# Patient Record
Sex: Female | Born: 1970
Health system: Southern US, Community
[De-identification: ages and names within clinical notes are randomized; demographics above are authoritative.]

## PROBLEM LIST (undated history)

## (undated) ENCOUNTER — Emergency Department (HOSPITAL_BASED_OUTPATIENT_CLINIC_OR_DEPARTMENT_OTHER): Admission: EM | Payer: PPO

## (undated) DIAGNOSIS — M5136 Other intervertebral disc degeneration, lumbar region: Secondary | ICD-10-CM

## (undated) DIAGNOSIS — M51369 Other intervertebral disc degeneration, lumbar region without mention of lumbar back pain or lower extremity pain: Secondary | ICD-10-CM

## (undated) DIAGNOSIS — M5126 Other intervertebral disc displacement, lumbar region: Secondary | ICD-10-CM

## (undated) DIAGNOSIS — N921 Excessive and frequent menstruation with irregular cycle: Secondary | ICD-10-CM

## (undated) DIAGNOSIS — K909 Intestinal malabsorption, unspecified: Secondary | ICD-10-CM

## (undated) DIAGNOSIS — M6530 Trigger finger, unspecified finger: Secondary | ICD-10-CM

## (undated) DIAGNOSIS — J45909 Unspecified asthma, uncomplicated: Secondary | ICD-10-CM

## (undated) HISTORY — DX: Other intervertebral disc degeneration, lumbar region: M51.36

## (undated) HISTORY — DX: Excessive and frequent menstruation with irregular cycle: N92.1

## (undated) HISTORY — PX: MYOMECTOMY ABDOMINAL APPROACH: SUR870

## (undated) HISTORY — DX: Other intervertebral disc degeneration, lumbar region without mention of lumbar back pain or lower extremity pain: M51.369

## (undated) HISTORY — DX: Unspecified asthma, uncomplicated: J45.909

## (undated) HISTORY — DX: Intestinal malabsorption, unspecified: K90.9

## (undated) HISTORY — DX: Other intervertebral disc displacement, lumbar region: M51.26

---
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
## 1998-08-24 ENCOUNTER — Ambulatory Visit (HOSPITAL_COMMUNITY): Admission: RE | Admit: 1998-08-24 | Discharge: 1998-08-24 | Payer: Self-pay | Admitting: Family Medicine

## 1998-08-24 ENCOUNTER — Encounter: Payer: Self-pay | Admitting: Family Medicine

## 1998-09-14 ENCOUNTER — Ambulatory Visit (HOSPITAL_COMMUNITY): Admission: RE | Admit: 1998-09-14 | Discharge: 1998-09-14 | Payer: Self-pay | Admitting: Family Medicine

## 1998-11-28 ENCOUNTER — Inpatient Hospital Stay (HOSPITAL_COMMUNITY): Admission: AD | Admit: 1998-11-28 | Discharge: 1998-11-28 | Payer: Self-pay | Admitting: Obstetrics and Gynecology

## 1998-12-22 ENCOUNTER — Encounter: Payer: Self-pay | Admitting: Obstetrics and Gynecology

## 1998-12-22 ENCOUNTER — Ambulatory Visit (HOSPITAL_COMMUNITY): Admission: RE | Admit: 1998-12-22 | Discharge: 1998-12-22 | Payer: Self-pay | Admitting: Obstetrics and Gynecology

## 1999-05-12 ENCOUNTER — Encounter (HOSPITAL_COMMUNITY): Admission: RE | Admit: 1999-05-12 | Discharge: 1999-08-10 | Payer: Self-pay | Admitting: Obstetrics and Gynecology

## 1999-12-13 ENCOUNTER — Encounter: Admission: RE | Admit: 1999-12-13 | Discharge: 1999-12-13 | Payer: Self-pay | Admitting: Otolaryngology

## 1999-12-13 ENCOUNTER — Encounter: Payer: Self-pay | Admitting: Otolaryngology

## 2005-03-26 ENCOUNTER — Emergency Department: Payer: Self-pay | Admitting: Emergency Medicine

## 2005-03-28 ENCOUNTER — Ambulatory Visit: Payer: Self-pay | Admitting: Emergency Medicine

## 2005-04-26 ENCOUNTER — Encounter: Admission: RE | Admit: 2005-04-26 | Discharge: 2005-04-26 | Payer: Self-pay | Admitting: Unknown Physician Specialty

## 2005-04-29 ENCOUNTER — Encounter: Admission: RE | Admit: 2005-04-29 | Discharge: 2005-04-29 | Payer: Self-pay | Admitting: Diagnostic Radiology

## 2005-05-26 ENCOUNTER — Ambulatory Visit (HOSPITAL_COMMUNITY): Admission: RE | Admit: 2005-05-26 | Discharge: 2005-05-26 | Payer: Self-pay | Admitting: Diagnostic Radiology

## 2005-05-30 ENCOUNTER — Observation Stay (HOSPITAL_COMMUNITY): Admission: RE | Admit: 2005-05-30 | Discharge: 2005-05-31 | Payer: Self-pay | Admitting: Diagnostic Radiology

## 2005-07-31 ENCOUNTER — Encounter: Admission: RE | Admit: 2005-07-31 | Discharge: 2005-07-31 | Payer: Self-pay | Admitting: Diagnostic Radiology

## 2005-08-04 ENCOUNTER — Ambulatory Visit: Payer: Self-pay | Admitting: Unknown Physician Specialty

## 2005-09-13 ENCOUNTER — Ambulatory Visit: Payer: Self-pay | Admitting: Internal Medicine

## 2005-10-07 ENCOUNTER — Ambulatory Visit: Payer: Self-pay | Admitting: Unknown Physician Specialty

## 2006-01-04 ENCOUNTER — Encounter: Admission: RE | Admit: 2006-01-04 | Discharge: 2006-01-04 | Payer: Self-pay | Admitting: Diagnostic Radiology

## 2006-09-19 ENCOUNTER — Ambulatory Visit: Payer: Self-pay | Admitting: Gastroenterology

## 2007-10-25 ENCOUNTER — Ambulatory Visit: Payer: Self-pay | Admitting: Specialist

## 2008-01-30 ENCOUNTER — Ambulatory Visit (HOSPITAL_COMMUNITY): Admission: RE | Admit: 2008-01-30 | Discharge: 2008-01-31 | Payer: Self-pay | Admitting: Neurological Surgery

## 2008-03-13 ENCOUNTER — Ambulatory Visit: Payer: Self-pay | Admitting: Neurological Surgery

## 2008-05-13 ENCOUNTER — Encounter: Admission: RE | Admit: 2008-05-13 | Discharge: 2008-05-13 | Payer: Self-pay | Admitting: Neurological Surgery

## 2008-06-13 ENCOUNTER — Encounter: Admission: RE | Admit: 2008-06-13 | Discharge: 2008-06-13 | Payer: Self-pay | Admitting: Neurological Surgery

## 2009-08-26 ENCOUNTER — Ambulatory Visit: Payer: Self-pay | Admitting: Family Medicine

## 2009-09-02 IMAGING — CR DG CHEST 2V
2 series · 2 of 2 positions shown · non-contrast
Comparison: None

CLINICAL DATA: Asthma and short of breath.  Preop respiratory
exam.

CHEST - 2 VIEW

[view not recorded (1 of 2)]
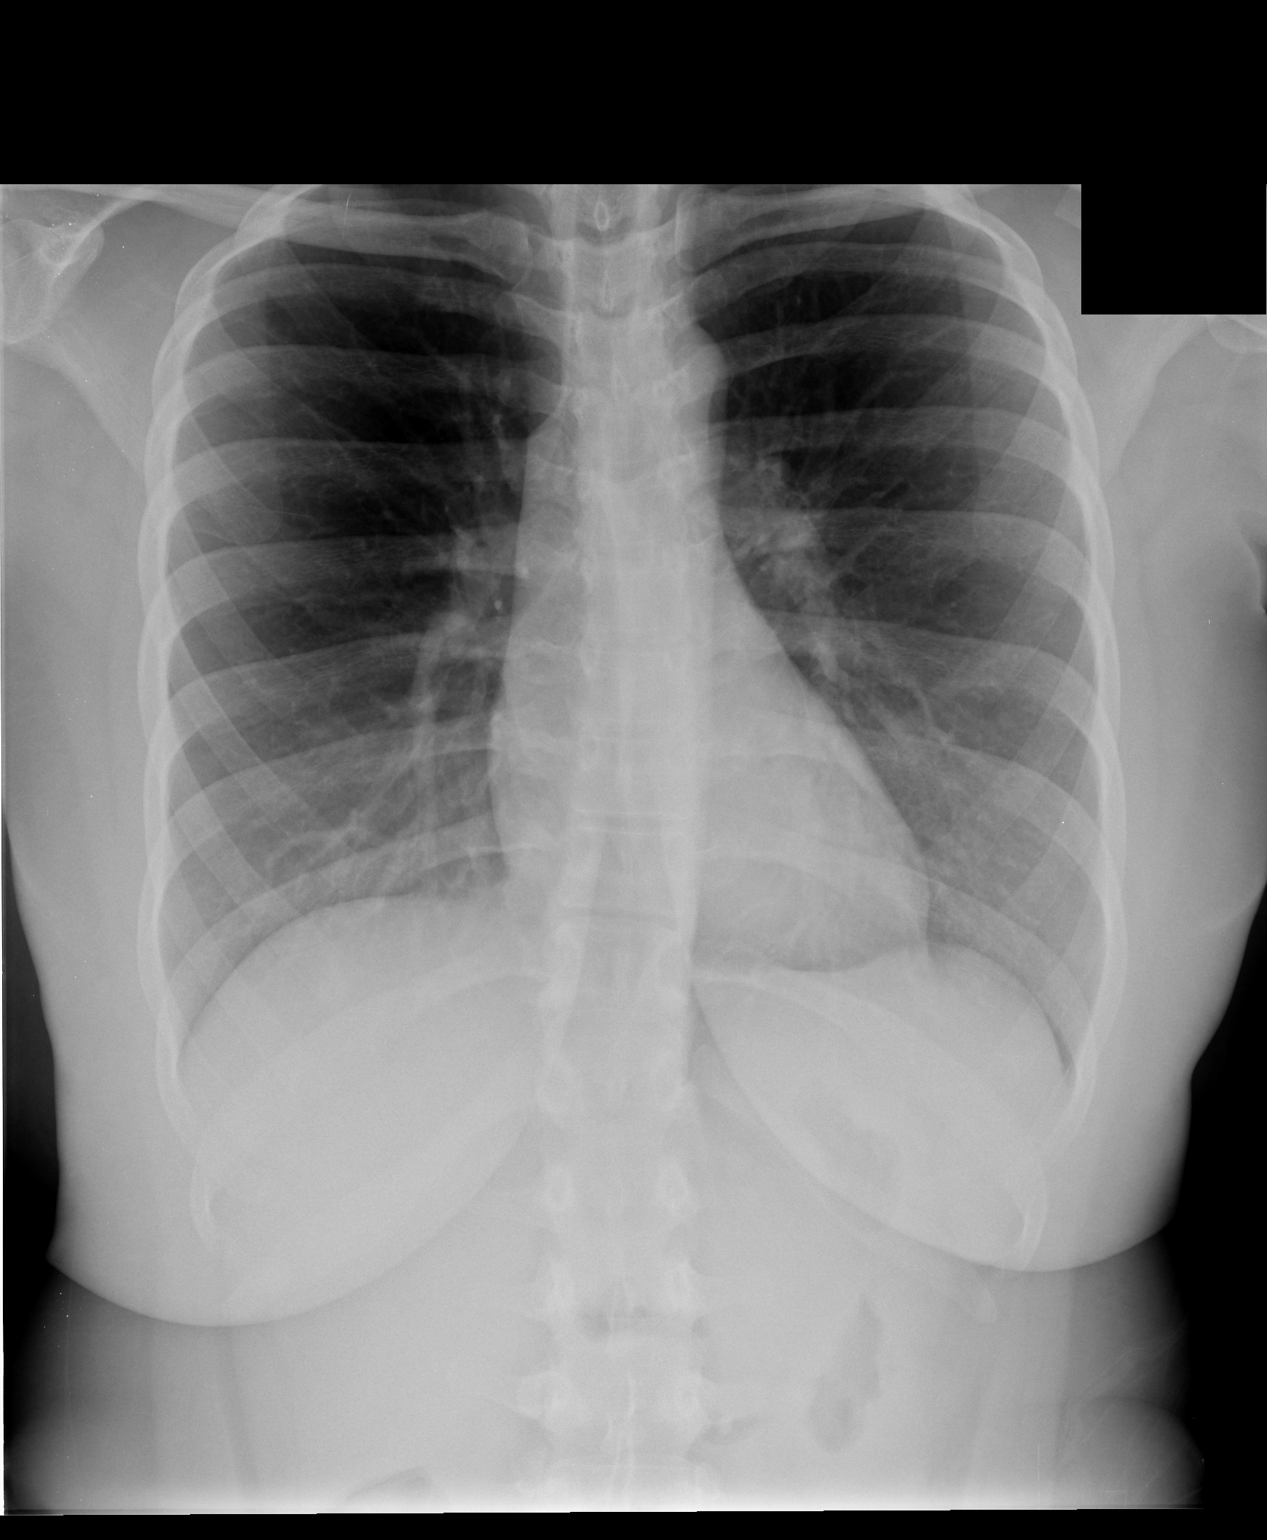

[view not recorded (2 of 2)]
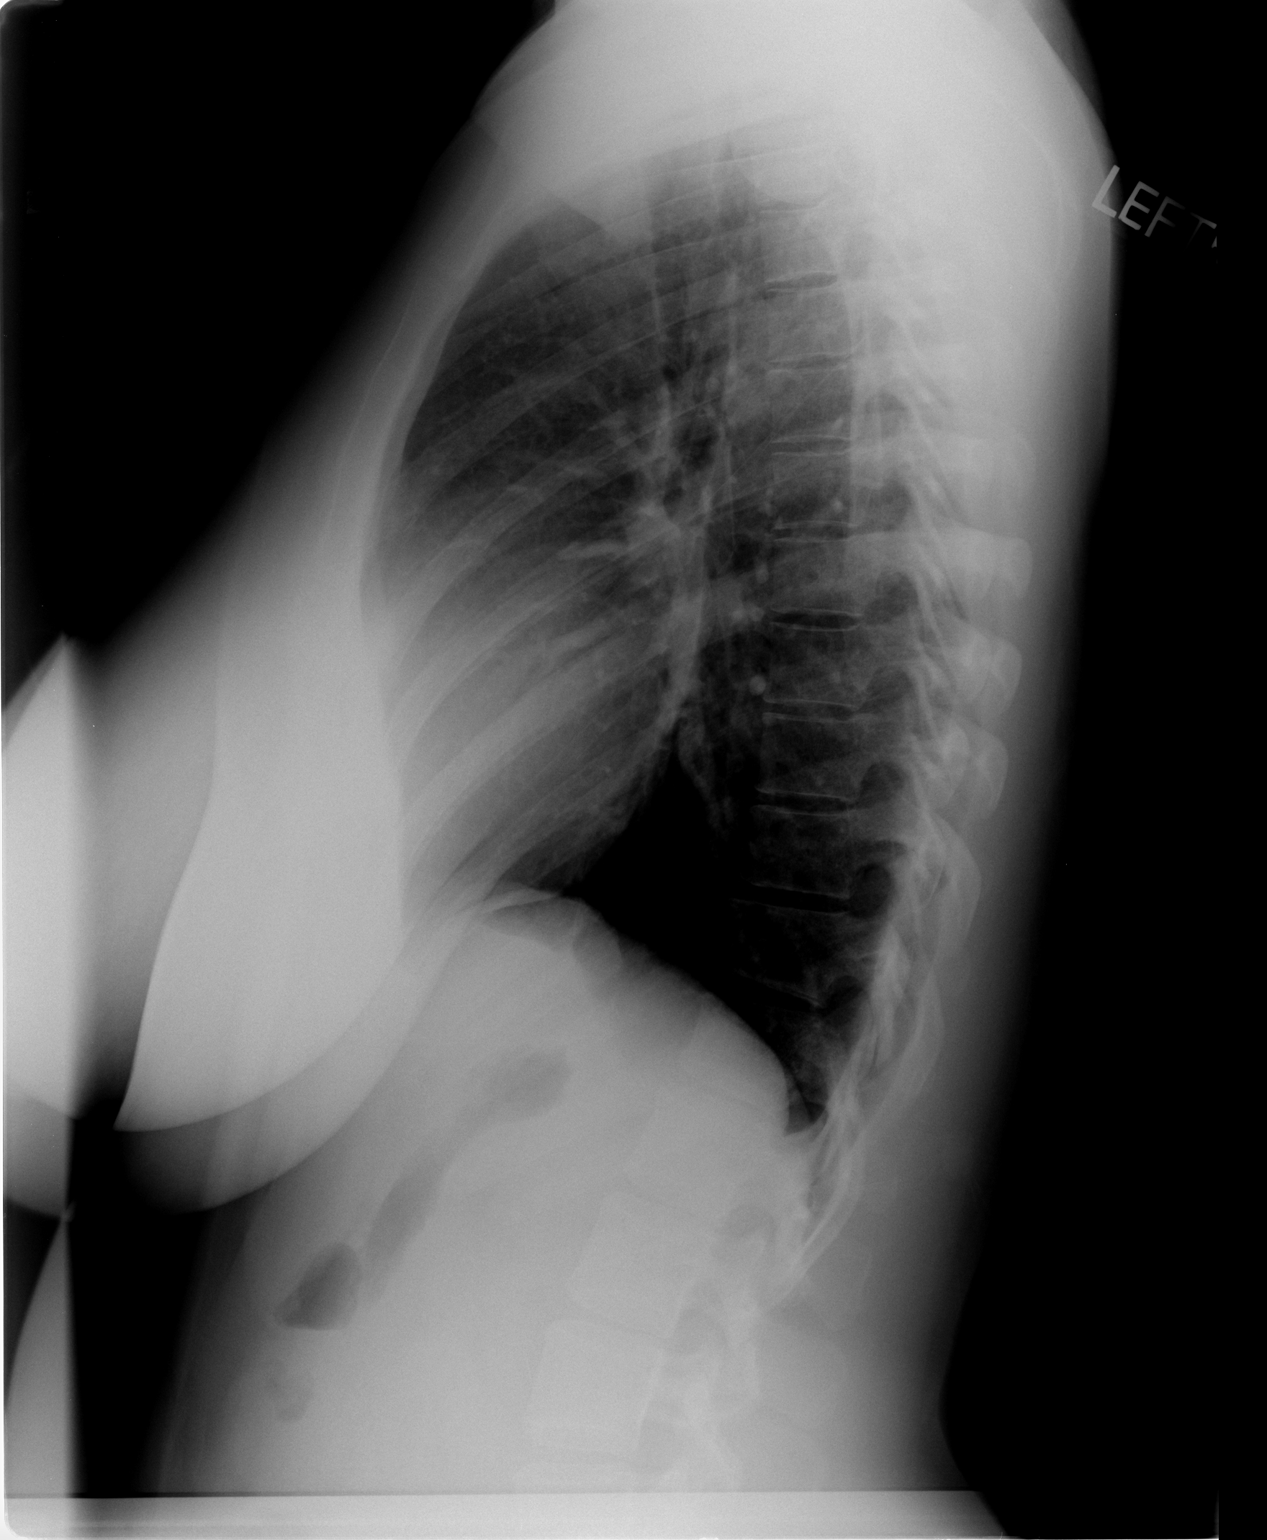

[2 of 2 positions shown; findings below may reference images not displayed]

FINDINGS: Heart size and mediastinal contours are normal.  Both
lungs are clear.  There is no evidence of pleural effusion.  No
mass or adenopathy identified.
IMPRESSION: No active disease.

## 2009-10-15 ENCOUNTER — Ambulatory Visit: Payer: Self-pay | Admitting: Otolaryngology

## 2010-01-28 ENCOUNTER — Ambulatory Visit: Payer: Self-pay | Admitting: Otolaryngology

## 2010-06-12 ENCOUNTER — Encounter: Payer: Self-pay | Admitting: Unknown Physician Specialty

## 2010-06-14 ENCOUNTER — Encounter: Payer: Self-pay | Admitting: Neurological Surgery

## 2010-10-05 NOTE — Op Note (Signed)
NAME:  Angela Jimenez, COZZA NO.:  [id number]   MEDICAL RECORD NO.:  [id number]          PATIENT TYPE:  OIB   LOCATION:  3523                         FACILITY:  [facility]   PHYSICIAN:  Tia Alert, MD     DATE OF BIRTH:  [birth year]   DATE OF PROCEDURE:  01/30/2008  DATE OF DISCHARGE:                               OPERATIVE REPORT   PREOPERATIVE DIAGNOSIS:  Cervical spondylosis with cervical disk  herniation at C4-C5 with neck and left arm pain.   POSTOPERATIVE DIAGNOSIS:  Cervical spondylosis with cervical disk  herniation at C4-C5 with neck and left arm pain.   PROCEDURE:  1. Decompressive anterior cervical diskectomy at C4-C5.  2. Anterior cervical arthrodesis at C4-C5 utilizing a 6-mm      corticocancellous allograft.  3. Anterior cervical plating at C4-C5 utilizing a 22-mm NuVasive Helix      plate.   SURGEON:  Tia Alert, MD   ASSISTANT:  Donalee Citrin, MD   ANESTHESIA:  General endotracheal.   COMPLICATIONS:  None apparent.   INDICATIONS FOR PROCEDURE:  Angela Jimenez is a 40 year old female who  presented with neck and left arm pain.  She had an MRI which showed  cervical spondylosis with cervical disk herniation at C4-C5.  She had  tried medical management for quite some time without significant relief  including epidural injections, recommended anterior cervical diskectomy,  fusion, and plating at C4-C5.  She understood the risks, benefits, and  expected outcome and wished to proceed.   DESCRIPTION OF PROCEDURE:  The patient was taken to the operating room  and after induction of adequate generalized endotracheal anesthesia, she  was placed in supine position on the operating room table.  Her right  anterior cervical region was prepped with DuraPrep and then draped in  the usual sterile fashion.  A 5 mL of local anesthesia was injected and  a transverse incision was made to the right of midline and carried down  to the platysma which was elevated,  opened, and undermined with  Metzenbaum scissors.  I then dissected the plane medial to the  sternocleidomastoid muscle, internal carotid artery, and lateral to the  trachea and esophagus to expose C4-C5 and intraoperative fluoroscopy  confirmed my level and then we took down the longus colli muscles and  placed the shadow line retractors under this.  We incised the disk space  and performed initial diskectomy with pituitary rongeurs and curved  curettes.  We then used the high-speed drill to drill the endplates down  to the level of the posterior longitudinal ligament, which was opened  and we undercut the bodies of C4-C5 with one 2-mm Kerrison punch until  the dura was nice and relaxed all the way across.  We marched along the  C5 superior endplate to identify the pedicles bilaterally and marched  along the pedicles, identifying the C5 nerve roots and decompressed them  distally into their respective foramina.  We then palpated it with a  nerve hook circumferentially and inspected with both palpation and  visualization to assure adequate decompression of the central canal and  the C5 nerve roots.  We then irrigated with saline solution, dried the  surgical bed, measured the interspace to be 6 mm and used a 6-mm  corticocancellous allograft and tapped this into position at C4-C5.  We  then used a 22-mm Helix plate and placed two 13 mm variable angle screws  in the bodies of C4-C5 and these locked in the plate by locking  mechanism within the plate.  We then irrigated with saline solution  containing bacitracin, dried all bleeding points and once meticulous  hemostasis was achieved closed the platysma with 3-0 Vicryl closing  subcuticular tissue with 3-0 Vicryl and closed the skin with benzoin and  Steri-Strips.  The drapes were removed.  Sterile dressing was applied.  The patient was awakened from the general anesthesia and transferred to  the recovery room in stable condition.  At the  end of the procedure, all  sponge, needle, and instrument counts were correct.      Tia Alert, MD  Electronically Signed     DSJ/MEDQ  D:  01/30/2008  T:  01/31/2008  Job:  [id number]

## 2011-02-23 LAB — CBC
HCT: 38.1
Hemoglobin: 12.7
MCHC: 33.2
MCV: 94.3
Platelets: 220
RBC: 4.04
RDW: 13.6
WBC: 4.4

## 2011-02-23 LAB — DIFFERENTIAL
Basophils Absolute: 0
Basophils Relative: 1
Eosinophils Absolute: 0.1
Eosinophils Relative: 2
Lymphocytes Relative: 41
Lymphs Abs: 1.8
Monocytes Absolute: 0.3
Monocytes Relative: 7
Neutro Abs: 2.1
Neutrophils Relative %: 49

## 2011-02-23 LAB — BASIC METABOLIC PANEL
BUN: 8
CO2: 21
Calcium: 8.7
Chloride: 110
Creatinine, Ser: 0.75
GFR calc Af Amer: >60
GFR calc non Af Amer: >60
Glucose, Bld: 90
Potassium: 3.8
Sodium: 136

## 2011-02-23 LAB — PROTIME-INR
INR: 1.1
Prothrombin Time: 14.3

## 2011-02-23 LAB — APTT: aPTT: 32

## 2011-02-23 LAB — HCG, SERUM, QUALITATIVE: Preg, Serum: NEGATIVE

## 2011-03-21 ENCOUNTER — Telehealth: Payer: Self-pay | Admitting: Emergency Medicine

## 2011-03-21 NOTE — Telephone Encounter (Signed)
PT LMOVM HERE ON 03-18-11 WANTING A F/U APPT W/ DR DOVER RE: FIBROIDS    03-21-11- S/W PT. EXPLAINED THAT SHE NEEDED TO F/U W/ HER GYN, HAVE PAP AND RECENT PELVIC -TRANSV. U/S TO SEE IF SHE HAS FIBROIDS AND IF SHE NEEDS TO BE REFERRED BACK TO [place].  ALSO, LET PT. KNOW THAT DR DOVER DOES NOT DO THESE PROCEDURES ANYMORE.

## 2012-02-22 ENCOUNTER — Emergency Department: Payer: Self-pay | Admitting: Emergency Medicine

## 2012-03-14 ENCOUNTER — Other Ambulatory Visit: Payer: Self-pay | Admitting: Neurological Surgery

## 2012-03-14 DIAGNOSIS — M545 Low back pain, unspecified: Secondary | ICD-10-CM

## 2012-03-14 DIAGNOSIS — M542 Cervicalgia: Secondary | ICD-10-CM

## 2012-03-18 ENCOUNTER — Ambulatory Visit
Admission: RE | Admit: 2012-03-18 | Discharge: 2012-03-18 | Disposition: A | Discharge status: Home / Self Care | Payer: Self-pay | Source: Ambulatory Visit | Attending: Neurological Surgery | Admitting: Neurological Surgery

## 2012-03-18 ENCOUNTER — Ambulatory Visit
Admission: RE | Admit: 2012-03-18 | Discharge: 2012-03-18 | Disposition: A | Discharge status: Home / Self Care | Payer: BC Managed Care – PPO | Source: Ambulatory Visit | Attending: Neurological Surgery | Admitting: Neurological Surgery

## 2012-03-18 DIAGNOSIS — M545 Low back pain, unspecified: Secondary | ICD-10-CM

## 2012-03-18 DIAGNOSIS — M542 Cervicalgia: Secondary | ICD-10-CM

## 2013-02-19 ENCOUNTER — Ambulatory Visit: Payer: Self-pay | Admitting: Internal Medicine

## 2013-03-20 ENCOUNTER — Ambulatory Visit: Payer: Self-pay | Admitting: Obstetrics & Gynecology

## 2013-03-20 LAB — PREGNANCY, URINE: Pregnancy Test, Urine: NEGATIVE m[IU]/mL

## 2013-03-20 LAB — CBC
HCT: 35.5 % (ref 35.0–47.0)
HGB: 11.8 g/dL — ABNORMAL LOW (ref 12.0–16.0)
MCH: 29.2 pg (ref 26.0–34.0)
MCHC: 33.3 g/dL (ref 32.0–36.0)
MCV: 88 fL (ref 80–100)
Platelet: 238 10*3/uL (ref 150–440)
RBC: 4.05 10*6/uL (ref 3.80–5.20)
RDW: 17.1 % — ABNORMAL HIGH (ref 11.5–14.5)
WBC: 4.9 10*3/uL (ref 3.6–11.0)

## 2013-03-26 ENCOUNTER — Ambulatory Visit: Payer: Self-pay | Admitting: Obstetrics & Gynecology

## 2013-03-27 LAB — PATHOLOGY REPORT

## 2013-05-21 ENCOUNTER — Encounter: Payer: Self-pay | Admitting: Podiatry

## 2013-05-21 ENCOUNTER — Ambulatory Visit (INDEPENDENT_AMBULATORY_CARE_PROVIDER_SITE_OTHER): Payer: Medicaid Other

## 2013-05-21 ENCOUNTER — Ambulatory Visit (INDEPENDENT_AMBULATORY_CARE_PROVIDER_SITE_OTHER): Payer: Medicaid Other | Admitting: Podiatry

## 2013-05-21 VITALS — BP 124/78 | HR 81 | Resp 16 | Ht 63.0 in | Wt 170.0 lb

## 2013-05-21 DIAGNOSIS — M7750 Other enthesopathy of unspecified foot: Secondary | ICD-10-CM

## 2013-05-21 DIAGNOSIS — M069 Rheumatoid arthritis, unspecified: Secondary | ICD-10-CM

## 2013-05-21 DIAGNOSIS — M25579 Pain in unspecified ankle and joints of unspecified foot: Secondary | ICD-10-CM

## 2013-05-21 DIAGNOSIS — M25571 Pain in right ankle and joints of right foot: Secondary | ICD-10-CM

## 2013-05-21 NOTE — Progress Notes (Signed)
   Subjective:    Patient ID: Angela Jimenez, female    DOB: November 18, 1970, 42 y.o.   MRN: [id number]  HPI Comments: RIGHT ANKLE PAIN  N SHARP  L RIGHT MEDIAL ANKLE  D YESTERDAY  O ALL OF SUDDEN  C LITTLE BETTER  A COULD NOT WALK ON IT  T ICE   Foot Pain Associated symptoms include headaches.      Review of Systems  HENT:       SINUS PROBLEMS   Respiratory: Positive for chest tightness and shortness of breath.        DIFFICULTY BREATHING   Musculoskeletal: Positive for back pain.  Allergic/Immunologic: Positive for environmental allergies and food allergies.  Neurological: Positive for dizziness, light-headedness and headaches.  All other systems reviewed and are negative.       Objective:   Physical Exam        Assessment & Plan:

## 2013-05-22 LAB — URIC ACID: Uric Acid: 3.7 mg/dL (ref 2.5–7.1)

## 2013-05-22 LAB — RHEUMATOID FACTOR: Rhuematoid fact SerPl-aCnc: 8 IU/mL (ref 0.0–13.9)

## 2013-05-22 LAB — ANA: Anti Nuclear Antibody(ANA): NEGATIVE

## 2013-05-22 LAB — SEDIMENTATION RATE: Sed Rate: 2 mm/hr (ref 0–32)

## 2013-05-22 LAB — C-REACTIVE PROTEIN: CRP: 3.1 mg/L (ref 0.0–4.9)

## 2013-05-22 NOTE — Progress Notes (Signed)
Subjective:     Patient ID: Angela Jimenez, female   DOB: Oct 16, 1970, 42 y.o.   MRN: [id number]  Foot Pain   patient presents stating I have pain in my right ankle of several days duration. States that she has had a history of tarsal tunnel syndrome but this does not feel that way   Review of Systems  All other systems reviewed and are negative.       Objective:   Physical Exam  Nursing note and vitals reviewed. Constitutional: She is oriented to person, place, and time.  Cardiovascular: Intact distal pulses.   Musculoskeletal: Normal range of motion.  Neurological: She is oriented to person, place, and time.  Skin: Skin is warm.   neurovascular status intact with normal muscle strength and no equinus condition noted. Range of motion of the ankle joint was found to be normal with mild edema around the posterior tibial tendon underneath the medial malleolus. No indications of rupture     Assessment:     Probable tendinitis of the posterior tibial tendon secondary to increased activity    Plan:     H&P and x-rays reviewed with patient. Instructed on reduced activity and supportive shoes ice therapy and oral anti-inflammatories. Reappoint as needed

## 2013-05-28 ENCOUNTER — Ambulatory Visit: Payer: Self-pay | Admitting: Podiatry

## 2014-09-12 NOTE — Op Note (Signed)
PATIENT NAME:  BREALYNN, CONTINO MR#:  [id number] DATE OF BIRTH:  10-Jul-1970  DATE OF PROCEDURE:  03/26/2013  PREOPERATIVE DIAGNOSIS: Fibroid uterus with abnormal bleeding.   POSTOPERATIVE DIAGNOSIS:  Fibroid uterus with abnormal bleeding.   PROCEDURE PERFORMED: Hysteroscopy, dilation and curettage procedure with myomectomy (resection of fibroid).   SURGEON:  Barnett Applebaum, M.D.   ANESTHESIA: General.   ESTIMATED BLOOD LOSS: Minimal.   COMPLICATIONS: None.   FINDINGS: Submucosal fibroids.   DISPOSITION: To recovery room stable.   TECHNIQUE: The patient is prepped and draped in the usual sterile fashion after adequate anesthesia is obtained in the dorsal lithotomy position. The bladder is drained with a Robinson catheter. A speculum is placed and the anterior lip of the cervix is grasped with a single-tooth tenaculum. The uterus is sounded to 10 cm and the cervix is dilated to a size 7 Hegar dilator. A  hysteroscope is inserted with lactated Ringer's solution used for distention of the intrauterine cavity and the above-mentioned findings visualized. Utilizing the MyoSure device, the fibroid is resected with all specimens collected and sent to pathology for further review. The entirety of the submucosal fibroid is resected down to the base of the myometrium and bilateral ostia for the fallopian tubes are visualized. There is minimal to no bleeding. There was a minimal discrepancy of fluid at 70 mL. The hysteroscope is removed and tenaculum is also removed with no bleeding noted at this site. The patient goes to the recovery room in stable condition. All sponge, instrument, and needle counts are correct.    ____________________________ R. Barnett Applebaum, MD rph:dp D: 03/26/2013 08:23:23 ET T: 03/26/2013 08:31:07 ET JOB#: [id number]  cc: Glean Salen, MD, <Dictator> Gae Dry MD ELECTRONICALLY SIGNED 03/27/2013 0:22

## 2015-02-27 ENCOUNTER — Other Ambulatory Visit: Payer: Self-pay | Admitting: Allergy

## 2015-02-27 MED ORDER — LEVOCETIRIZINE DIHYDROCHLORIDE 5 MG PO TABS: 5.0000 mg | ORAL_TABLET | Freq: Every evening | ORAL | Status: DC

## 2015-04-09 ENCOUNTER — Encounter: Payer: Self-pay | Admitting: Pediatrics

## 2015-04-09 ENCOUNTER — Ambulatory Visit (INDEPENDENT_AMBULATORY_CARE_PROVIDER_SITE_OTHER): Payer: Medicare HMO | Admitting: Pediatrics

## 2015-04-09 VITALS — BP 104/62 | HR 78 | Temp 98.0°F | Resp 16 | Ht 62.01 in | Wt 152.8 lb

## 2015-04-09 DIAGNOSIS — J4540 Moderate persistent asthma, uncomplicated: Secondary | ICD-10-CM | POA: Diagnosis not present

## 2015-04-09 DIAGNOSIS — K219 Gastro-esophageal reflux disease without esophagitis: Secondary | ICD-10-CM | POA: Diagnosis not present

## 2015-04-09 DIAGNOSIS — T7800XA Anaphylactic reaction due to unspecified food, initial encounter: Secondary | ICD-10-CM | POA: Insufficient documentation

## 2015-04-09 DIAGNOSIS — J301 Allergic rhinitis due to pollen: Secondary | ICD-10-CM | POA: Diagnosis not present

## 2015-04-09 DIAGNOSIS — T7800XD Anaphylactic reaction due to unspecified food, subsequent encounter: Secondary | ICD-10-CM | POA: Insufficient documentation

## 2015-04-09 MED ORDER — ALBUTEROL SULFATE HFA 108 (90 BASE) MCG/ACT IN AERS: 2.0000 | INHALATION_SPRAY | RESPIRATORY_TRACT | Status: DC | PRN

## 2015-04-09 NOTE — Patient Instructions (Addendum)
Advair 2 30-2 puffs twice a day Singulair 10 mg once a day Pro-air 2 puffs every 4 hours if needed for coughing or wheezing Levocetirizine 5 mg once a day for runny nose Pataday 1 drop once a day if needed for itchy eyes Fluticasone 2 sprays per nostril once a day if needed for stuffy nose Albuterol 0.083% one unit dose every 4 hours if needed She will call me she's not doing well on this treatment plan Continue on her other medications Continue avoiding peanuts and tree nuts

## 2015-04-09 NOTE — Progress Notes (Signed)
  [place] [address] Dept: [phone]  FOLLOW UP NOTE  Patient ID: Angela Jimenez, female    DOB: [birth year]  Age: 44 y.o. MRN: [id number] Date of Office Visit: 04/09/2015  Assessment Chief Complaint: Allergies and Cough  HPI Angela Jimenez presents for follow-up of her asthma and allergic rhinitis. She is extremely allergic to grass pollens and weeds and some tree pollens. Mild reactivity to molds Her asthma has been well controlled if she takes her medications. She has not had emergency room visits for exacerbations of asthma in the past year. She does not want flu vaccination  Her current medications are outlined in the chart   Drug Allergies:  Allergies  Allergen Reactions  . Amoxicillin Anaphylaxis  . Peanut-Containing Drug Products Anaphylaxis  . Penicillins Anaphylaxis  . Other Other (See Comments)    Tree nuts, anaphylaxis and migraine headache    Physical Exam: BP 104/62 mmHg  Pulse 78  Temp(Src) 98 F (36.7 C) (Oral)  Resp 16  Ht 5' 2.01" (1.575 m)  Wt 152 lb 12.5 oz (69.3 kg)  BMI 27.94 kg/m2   Physical Exam  Constitutional: She is oriented to person, place, and time. She appears well-developed and well-nourished.  HENT:  Eyes normal. Ears normal. Nose normal. Pharynx normal.  Neck: Neck supple.  Cardiovascular:  S1 and S2 normal no murmurs  Pulmonary/Chest:  Clear to percussion and auscultation  Lymphadenopathy:    She has no cervical adenopathy.  Neurological: She is alert and oriented to person, place, and time.  Psychiatric: She has a normal mood and affect. Her behavior is normal. Judgment and thought content normal.  Vitals reviewed.   Diagnostics:  FVC 2.68 L FEV1 2.26 L. Predicted FVC 2.77 L predicted FEV1 2.26 L-spirometry in the normal range  Assessment and Plan: 1. Moderate persistent asthma, uncomplicated   2. Allergic rhinitis due to pollen   3. Gastroesophageal reflux disease without esophagitis   4. Allergy  with anaphylaxis due to food, subsequent encounter     Meds ordered this encounter  Medications  . albuterol (PROAIR HFA) 108 (90 BASE) MCG/ACT inhaler    Sig: Inhale 2 puffs into the lungs every 4 (four) hours as needed for wheezing or shortness of breath.    Dispense:  8 g    Refill:  2    Patient Instructions  Advair 2 30-2 puffs twice a day Singulair 10 mg once a day Pro-air 2 puffs every 4 hours if needed for coughing or wheezing Levocetirizine 5 mg once a day for runny nose Pataday 1 drop once a day if needed for itchy eyes Fluticasone 2 sprays per nostril once a day if needed for stuffy nose Albuterol 0.083% one unit dose every 4 hours if needed She will call me she's not doing well on this treatment plan Continue on her other medications Continue avoiding peanuts and tree nuts    Return in about 6 months (around 10/07/2015).    Thank you for the opportunity to care for this patient.  Please do not hesitate to contact me with questions.  Penne Lash, M.D.  [facility] [address] [place], [place] [address] [phone]

## 2015-04-23 ENCOUNTER — Other Ambulatory Visit: Payer: Self-pay | Admitting: Allergy

## 2015-04-23 MED ORDER — MONTELUKAST SODIUM 10 MG PO TABS: ORAL_TABLET | ORAL | Status: DC

## 2015-04-23 MED ORDER — ADVAIR HFA 230-21 MCG/ACT IN AERO: INHALATION_SPRAY | RESPIRATORY_TRACT | Status: DC

## 2015-06-17 ENCOUNTER — Telehealth: Payer: Self-pay | Admitting: Pediatrics

## 2015-06-17 NOTE — Telephone Encounter (Signed)
Patient requesting to speak with billing about account # [phone].

## 2015-06-19 NOTE — Telephone Encounter (Signed)
Will file Mcd on MM

## 2015-06-26 ENCOUNTER — Other Ambulatory Visit: Payer: Self-pay

## 2015-06-26 MED ORDER — LEVOCETIRIZINE DIHYDROCHLORIDE 2.5 MG/5ML PO SOLN
2.5000 mg | Freq: Every evening | ORAL | Status: DC
Start: 2015-06-26 — End: 2017-08-08

## 2015-07-01 ENCOUNTER — Other Ambulatory Visit: Payer: Self-pay

## 2015-07-01 MED ORDER — LEVOCETIRIZINE DIHYDROCHLORIDE 5 MG PO TABS: 5.0000 mg | ORAL_TABLET | Freq: Every evening | ORAL | Status: DC

## 2015-07-02 ENCOUNTER — Other Ambulatory Visit: Payer: Self-pay | Admitting: Allergy

## 2015-07-02 ENCOUNTER — Other Ambulatory Visit: Payer: Self-pay | Admitting: Neurology

## 2015-07-02 MED ORDER — MONTELUKAST SODIUM 10 MG PO TABS: ORAL_TABLET | ORAL | Status: DC

## 2015-07-02 MED ORDER — LEVOCETIRIZINE DIHYDROCHLORIDE 5 MG PO TABS: 5.0000 mg | ORAL_TABLET | Freq: Every evening | ORAL | Status: DC

## 2015-07-07 ENCOUNTER — Telehealth: Payer: Self-pay | Admitting: Allergy

## 2015-07-07 ENCOUNTER — Other Ambulatory Visit: Payer: Self-pay | Admitting: Allergy

## 2015-07-07 NOTE — Telephone Encounter (Signed)
Talked with pharmacy said that they could refill the levocetirizine 5mg  on April 24th.

## 2015-10-13 ENCOUNTER — Ambulatory Visit (INDEPENDENT_AMBULATORY_CARE_PROVIDER_SITE_OTHER): Payer: Medicare HMO | Admitting: Pediatrics

## 2015-10-13 ENCOUNTER — Encounter: Payer: Self-pay | Admitting: Pediatrics

## 2015-10-13 VITALS — BP 114/62 | HR 80 | Temp 98.7°F | Resp 16 | Ht 62.01 in | Wt 153.6 lb

## 2015-10-13 DIAGNOSIS — J301 Allergic rhinitis due to pollen: Secondary | ICD-10-CM | POA: Diagnosis not present

## 2015-10-13 DIAGNOSIS — J4530 Mild persistent asthma, uncomplicated: Secondary | ICD-10-CM | POA: Diagnosis not present

## 2015-10-13 DIAGNOSIS — T7800XD Anaphylactic reaction due to unspecified food, subsequent encounter: Secondary | ICD-10-CM

## 2015-10-13 LAB — PULMONARY FUNCTION TEST

## 2015-10-13 MED ORDER — ALBUTEROL SULFATE (2.5 MG/3ML) 0.083% IN NEBU
2.5000 mg | INHALATION_SOLUTION | RESPIRATORY_TRACT | Status: DC | PRN
Start: 2015-10-13 — End: 2017-07-18

## 2015-10-13 MED ORDER — PATADAY 0.2 % OP SOLN: OPHTHALMIC | Status: DC

## 2015-10-13 MED ORDER — LEVOCETIRIZINE DIHYDROCHLORIDE 5 MG PO TABS: 5.0000 mg | ORAL_TABLET | Freq: Every evening | ORAL | Status: DC

## 2015-10-13 MED ORDER — BECLOMETHASONE DIPROPIONATE 80 MCG/ACT IN AERS: INHALATION_SPRAY | RESPIRATORY_TRACT | Status: DC

## 2015-10-13 MED ORDER — FLUTICASONE PROPIONATE 50 MCG/ACT NA SUSP: NASAL | Status: DC

## 2015-10-13 MED ORDER — ALBUTEROL SULFATE HFA 108 (90 BASE) MCG/ACT IN AERS: 2.0000 | INHALATION_SPRAY | RESPIRATORY_TRACT | Status: DC | PRN

## 2015-10-13 MED ORDER — MONTELUKAST SODIUM 10 MG PO TABS: ORAL_TABLET | ORAL | Status: DC
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
## 2015-10-13 NOTE — Patient Instructions (Addendum)
Montelukast  10 mg once a day for cough or wheeze Pro-air 2 puffs every 4 hours if needed for wheezing or coughing spells Qvar 80 one puff twice a day to prevent coughing or wheezing Levocetirizine 5 mg once a day for runny nose Pataday 1 drop once a day if needed for itchy eyes Fluticasone 2 sprays per nostril once a day if needed for stuffy nose Albuterol 0.083% one unit dose every 4 hours if needed  Continue avoiding peanuts and tree nuts.. He you have  an allergic reaction take Benadryl 50 mg every 4 hours and if you have life-threatening symptoms inject  with EpiPen 0.3 mg

## 2015-10-15 ENCOUNTER — Encounter: Payer: Self-pay | Admitting: Pediatrics

## 2015-11-03 ENCOUNTER — Other Ambulatory Visit: Payer: Self-pay

## 2015-11-03 MED ORDER — FLUTICASONE PROPIONATE HFA 110 MCG/ACT IN AERO: 1.0000 | INHALATION_SPRAY | Freq: Two times a day (BID) | RESPIRATORY_TRACT | Status: DC

## 2015-11-03 NOTE — Telephone Encounter (Signed)
Pt. Called in and stated that her [employer] medicare was not covering the qvar 59mcg. The pt. Has to try flovent 132mcg before [employer] will cover the qvar 80 mcg. I left message on pt's voice mail that the flovent 110 mcg was sent into her pharmacy and to return call to the office to let us know she received our message regarding her medication.

## 2015-12-03 ENCOUNTER — Ambulatory Visit: Payer: Medicare HMO | Admitting: Pediatrics

## 2016-03-23 ENCOUNTER — Other Ambulatory Visit: Payer: Self-pay | Admitting: Pediatrics

## 2016-03-24 ENCOUNTER — Other Ambulatory Visit: Payer: Self-pay | Admitting: Pediatrics

## 2016-04-15 ENCOUNTER — Other Ambulatory Visit: Payer: Self-pay | Admitting: Pediatrics

## 2016-04-19 ENCOUNTER — Other Ambulatory Visit: Payer: Self-pay | Admitting: Pediatrics

## 2016-04-20 ENCOUNTER — Other Ambulatory Visit: Payer: Self-pay | Admitting: Pediatrics

## 2016-05-30 ENCOUNTER — Other Ambulatory Visit: Payer: Self-pay | Admitting: Allergy and Immunology

## 2016-05-30 ENCOUNTER — Other Ambulatory Visit: Payer: Self-pay | Admitting: Pediatrics

## 2016-05-30 MED ORDER — MONTELUKAST SODIUM 10 MG PO TABS: ORAL_TABLET | ORAL | 0 refills | Status: DC

## 2016-05-30 MED ORDER — LEVOCETIRIZINE DIHYDROCHLORIDE 5 MG PO TABS: 5.0000 mg | ORAL_TABLET | Freq: Every evening | ORAL | 0 refills | Status: DC

## 2016-05-30 NOTE — Telephone Encounter (Signed)
Prescriptions for singulair  10mg  and flovent 110 mcg faxed in.

## 2016-06-26 ENCOUNTER — Other Ambulatory Visit: Payer: Self-pay | Admitting: Pediatrics

## 2016-06-27 ENCOUNTER — Encounter: Payer: Self-pay | Admitting: Pediatrics

## 2016-06-27 ENCOUNTER — Ambulatory Visit (INDEPENDENT_AMBULATORY_CARE_PROVIDER_SITE_OTHER): Payer: Medicare HMO | Admitting: Pediatrics

## 2016-06-27 VITALS — BP 130/82 | HR 87 | Temp 98.4°F | Resp 24

## 2016-06-27 DIAGNOSIS — J301 Allergic rhinitis due to pollen: Secondary | ICD-10-CM | POA: Diagnosis not present

## 2016-06-27 DIAGNOSIS — J4540 Moderate persistent asthma, uncomplicated: Secondary | ICD-10-CM | POA: Diagnosis not present

## 2016-06-27 DIAGNOSIS — T7800XD Anaphylactic reaction due to unspecified food, subsequent encounter: Secondary | ICD-10-CM

## 2016-06-27 DIAGNOSIS — J208 Acute bronchitis due to other specified organisms: Secondary | ICD-10-CM

## 2016-06-27 MED ORDER — AZITHROMYCIN 250 MG PO TABS: ORAL_TABLET | ORAL | 0 refills | Status: DC

## 2016-06-27 MED ORDER — MONTELUKAST SODIUM 10 MG PO TABS: 10.0000 mg | ORAL_TABLET | Freq: Every day | ORAL | 5 refills | Status: DC

## 2016-06-27 MED ORDER — LEVOCETIRIZINE DIHYDROCHLORIDE 5 MG PO TABS: 5.0000 mg | ORAL_TABLET | Freq: Every evening | ORAL | 5 refills | Status: DC

## 2016-06-27 MED ORDER — FLUTICASONE PROPIONATE HFA 110 MCG/ACT IN AERO: 2.0000 | INHALATION_SPRAY | Freq: Two times a day (BID) | RESPIRATORY_TRACT | 5 refills | Status: DC

## 2016-06-27 MED ORDER — FLUTICASONE PROPIONATE 50 MCG/ACT NA SUSP: NASAL | 5 refills | Status: DC

## 2016-06-27 MED ORDER — ALBUTEROL SULFATE HFA 108 (90 BASE) MCG/ACT IN AERS
2.0000 | INHALATION_SPRAY | RESPIRATORY_TRACT | 2 refills | Status: DC | PRN
Start: 2016-06-27 — End: 2017-05-02

## 2016-06-27 NOTE — Patient Instructions (Addendum)
Continue on your current medications Continue avoiding peanuts and tree nuts Zithromax 250 mg-take 2 tablets tonight and then 1 tablet at night for the next 4 nights Call me if you are not doing better on this treatment plan Add prednisone 10 mg twice a day for 4 days, 10 mg on the fifth day

## 2016-06-27 NOTE — Progress Notes (Signed)
[place] [address] Dept: [phone]  FOLLOW UP NOTE  Patient ID: Angela Jimenez, female    DOB: November 13, 1970  Age: 46 y.o. MRN: [id number] Date of Office Visit: 06/27/2016  Assessment  Chief Complaint: Asthma (some wheezing) and Cough (post nasal drainage, stuffy and runny nose, sore throat from coughing)  HPI TRANIKA SUGGS presents for follow-up of asthma She has had more coughing during the past week and is bringing up a discolored mucus. She is having mild nasal congestion. She continues to avoid peanuts and tree nuts.   Current medications are Flovent 110- 2 puffs twice a day, montelukast 10 mg once a, Ventolin 2 puffs every 4 hours if needed or instead albuterol 0.083% one unit dose every 4 hours if needed, levocetirizine 5 mg once a day, fluticasone 2 sprays per nostril once a day, Pataday 1 drop once a day if needed. She also has Benadryl and EpiPen 0.3 mg in case of an allergic reaction to peanuts or tree nuts   Drug Allergies:  Allergies  Allergen Reactions  . Amoxicillin Anaphylaxis  . Peanut-Containing Drug Products Anaphylaxis  . Penicillins Anaphylaxis  . Wheat Bran   . Corn-Containing Products   . Other Other (See Comments)    Tree nuts, anaphylaxis and migraine headache    Physical Exam: BP 130/82   Pulse 87   Temp 98.4 F (36.9 C) (Oral)   Resp (!) 24   SpO2 97%    Physical Exam  Constitutional: She is oriented to person, place, and time. She appears well-developed and well-nourished.  HENT:  Eyes normal. Ears normal. Nose mild swelling of nasal turbinates. Pharynx normal.  Neck: Neck supple.  Cardiovascular:  S1 and S2 normal no murmurs  Pulmonary/Chest:  Clear to percussion and auscultation except for some rhonchi in both lungs  Lymphadenopathy:    She has no cervical adenopathy.  Neurological: She is alert and oriented to person, place, and time.  Psychiatric: She has a normal mood and affect. Her behavior is normal. Judgment  and thought content normal.  Vitals reviewed.   Diagnostics:  FVC 2.36 L FEV1 2.02 L. Predicted FVC 2.88 L predicted FEV1 2.35 L-the spirometry is in the normal range  Assessment and Plan: 1. Moderate persistent asthma without complication   2. Anaphylactic shock due to food, subsequent encounter   3. Acute bronchitis due to other specified organisms   4. Acute seasonal allergic rhinitis due to pollen     Meds ordered this encounter  Medications  . azithromycin (ZITHROMAX) 250 MG tablet    Sig: Take 2 tablets tonight then 1 tablet at night for the next 4 nights    Dispense:  6 each    Refill:  0  . albuterol (VENTOLIN HFA) 108 (90 Base) MCG/ACT inhaler    Sig: Inhale 2 puffs into the lungs every 4 (four) hours as needed for wheezing or shortness of breath.    Dispense:  1 Inhaler    Refill:  2  . fluticasone (FLOVENT HFA) 110 MCG/ACT inhaler    Sig: Inhale 2 puffs into the lungs 2 (two) times daily.    Dispense:  1 Inhaler    Refill:  5  . fluticasone (FLONASE) 50 MCG/ACT nasal spray    Sig: TWO SPRAYS EACH NOSTRIL ONCE A DAY FOR NASAL CONGESTION OR DRAINAGE.    Dispense:  16 g    Refill:  5  . levocetirizine (XYZAL) 5 MG tablet    Sig: Take 1  tablet (5 mg total) by mouth every evening.    Dispense:  30 tablet    Refill:  5  . montelukast (SINGULAIR) 10 MG tablet    Sig: Take 1 tablet (10 mg total) by mouth at bedtime.    Dispense:  30 tablet    Refill:  5    Patient Instructions  Continue on your current medications Continue avoiding peanuts and tree nuts Zithromax 250 mg-take 2 tablets tonight and then 1 tablet at night for the next 4 nights Call me if you are not doing better on this treatment plan Add prednisone 10 mg twice a day for 4 days, 10 mg on the fifth day   Return in about 4 weeks (around 07/25/2016).    Thank you for the opportunity to care for this patient.  Please do not hesitate to contact me with questions.  Penne Lash, M.D.  [facility] [address] [place], [place] [address] [phone]

## 2016-06-29 ENCOUNTER — Other Ambulatory Visit: Payer: Self-pay | Admitting: Allergy

## 2016-06-29 ENCOUNTER — Telehealth: Payer: Self-pay | Admitting: Allergy

## 2016-06-29 MED ORDER — BENZONATATE 100 MG PO CAPS: ORAL_CAPSULE | ORAL | 1 refills | Status: DC

## 2016-06-29 NOTE — Telephone Encounter (Signed)
Increase Qvar 80 to  2 puffs twice a day for 2 weeks. Finish the course of Zithromax and prednisone for 5 days. We will call in benzonatate 100 mg tablets-take 1 tablet every 6 hours if needed for cough

## 2016-06-29 NOTE — Telephone Encounter (Signed)
Still coughing a lot.Sore throut, lot of drainage. Little yelloe and little blood in it. Hard to get a lot up. Mucinex makes her sick. No fever Axhy and sore from coughing so much. Using Zyrtec D. Please advise. Phone [phone]

## 2016-06-29 NOTE — Telephone Encounter (Signed)
Called in rx for Benzonatate and informed pt. To finish z-pak and prednisone and Benzonatate was called in.

## 2016-06-30 ENCOUNTER — Telehealth: Payer: Self-pay | Admitting: Pediatrics

## 2016-06-30 NOTE — Telephone Encounter (Signed)
Pt is asking if she can have Tussinex instead of the teslon pearls. Please call back.

## 2016-07-01 ENCOUNTER — Other Ambulatory Visit: Payer: Self-pay

## 2016-07-01 MED ORDER — HYDROCODONE-CHLORPHENIRAMINE 5-4 MG/5ML PO SOLN: 5.0000 mL | Freq: Four times a day (QID) | ORAL | 0 refills | Status: DC | PRN

## 2016-07-01 NOTE — Telephone Encounter (Signed)
Please advise 

## 2016-07-01 NOTE — Telephone Encounter (Signed)
Reviewed note. She is very on azithromycin for bronchitis. Since the [employer] did not work, we did write a prescription for Tussionex 5 mL every 6 hours as needed. She has no refills on this. She will need to come into the clinic to pick up the prescription since it is a controlled substance.  Salvatore Marvel, MD [facility]

## 2016-07-23 ENCOUNTER — Other Ambulatory Visit: Payer: Self-pay | Admitting: Pediatrics

## 2016-07-26 ENCOUNTER — Ambulatory Visit (INDEPENDENT_AMBULATORY_CARE_PROVIDER_SITE_OTHER): Payer: Medicare HMO | Admitting: Pediatrics

## 2016-07-26 ENCOUNTER — Encounter: Payer: Self-pay | Admitting: Pediatrics

## 2016-07-26 VITALS — BP 126/80 | HR 80 | Temp 98.3°F | Resp 20

## 2016-07-26 DIAGNOSIS — T7800XD Anaphylactic reaction due to unspecified food, subsequent encounter: Secondary | ICD-10-CM | POA: Diagnosis not present

## 2016-07-26 DIAGNOSIS — J301 Allergic rhinitis due to pollen: Secondary | ICD-10-CM | POA: Diagnosis not present

## 2016-07-26 DIAGNOSIS — J4540 Moderate persistent asthma, uncomplicated: Secondary | ICD-10-CM | POA: Diagnosis not present

## 2016-07-26 NOTE — Progress Notes (Signed)
  [place] [address] Dept: [phone]  FOLLOW UP NOTE  Patient ID: Angela Jimenez, female    DOB: [birth year]  Age: 46 y.o. MRN: [id number] Date of Office Visit: 07/26/2016  Assessment  Chief Complaint: Asthma (doing good); Cough (doing much better. cough finally resolved after 3 weeks); and Nasal Congestion (having a little flare up with post nasal drip)  HPI Angela Jimenez presents for follow-up of asthma and allergic rhinitis and food allergies. Her asthma is much improved. At times she has nasal congestion , but she is not using fluticasone nasal spray on a daily basis. She continues to avoid peanuts and tree nuts.  Current medications are Flovent 110-2 puffs twice a, montelukast  10 mg once a day, Ventolin 2 puffs every 4 hours if needed or instead albuterol 0.083% one unit dose every 4 hours if needed, levocetirizine 5 mg once a day, fluticasone 2 sprays per nostril once a day, Pataday 1 drop once a day if needed and Benadryl and EpiPen 0.3 mg if needed   Drug Allergies:  Allergies  Allergen Reactions  . Amoxicillin Anaphylaxis  . Peanut-Containing Drug Products Anaphylaxis  . Penicillins Anaphylaxis  . Wheat Bran   . Corn-Containing Products   . Other Other (See Comments)    Tree nuts, anaphylaxis and migraine headache    Physical Exam: BP 126/80 (BP Location: Left Arm, Patient Position: Sitting, Cuff Size: Normal)   Pulse 80   Temp 98.3 F (36.8 C) (Oral)   Resp 20    Physical Exam  Constitutional: She is oriented to person, place, and time. She appears well-developed and well-nourished.  HENT:  Eyes normal. Ears normal. Nose normal. Pharynx normal.  Neck: Neck supple.  Cardiovascular:  S1 and S2 normal no murmurs  Pulmonary/Chest:  Clear to percussion and auscultation  Lymphadenopathy:    She has no cervical adenopathy.  Neurological: She is alert and oriented to person, place, and time.  Psychiatric: She has a normal mood and affect. Her  behavior is normal. Judgment and thought content normal.    Diagnostics:    FVC 2.59 L FEV1 2.16 L. Predicted FVC 2.75 L predicted FEV1 2.24 L-the spirometry is in the normal range  Assessment and Plan: 1. Moderate persistent asthma without complication   2. Acute seasonal allergic rhinitis due to pollen   3. Anaphylactic shock due to food, subsequent encounter        Patient Instructions  Continue on the current medications Fluticasone 2 sprays per nostril once a day on a daily basis to help her with the spring pollen season . Call me if you're not doing better on this treatment plan   Return in about 3 months (around 10/26/2016).    Thank you for the opportunity to care for this patient.  Please do not hesitate to contact me with questions.  Penne Lash, M.D.  [facility] [address] [place], [place] [address] [phone]

## 2016-07-26 NOTE — Patient Instructions (Addendum)
Continue on the current medications Fluticasone 2 sprays per nostril once a day on a daily basis to help her with the spring pollen season . Call me if you're not doing better on this treatment plan

## 2016-09-07 ENCOUNTER — Telehealth: Payer: Self-pay | Admitting: Pediatrics

## 2016-09-07 NOTE — Telephone Encounter (Signed)
Called patient.  Xyzal not covered per [facility].  Patient will call pharmacy to find out which of her insurances is paying for her medications and have them run the Rx and submit a PA to [place] if required.

## 2016-09-07 NOTE — Telephone Encounter (Signed)
Patient is calling stating that her insurance does not cover her medications - what other medications can be prescribed that her insurance will cover?? Please call pt at [phone] to answer any questions

## 2016-11-01 ENCOUNTER — Encounter: Payer: Self-pay | Admitting: Pediatrics

## 2016-11-01 ENCOUNTER — Ambulatory Visit (INDEPENDENT_AMBULATORY_CARE_PROVIDER_SITE_OTHER): Payer: Medicare HMO | Admitting: Pediatrics

## 2016-11-01 VITALS — BP 118/82 | HR 82 | Temp 98.3°F | Resp 16 | Ht 63.0 in

## 2016-11-01 DIAGNOSIS — K219 Gastro-esophageal reflux disease without esophagitis: Secondary | ICD-10-CM

## 2016-11-01 DIAGNOSIS — T7800XD Anaphylactic reaction due to unspecified food, subsequent encounter: Secondary | ICD-10-CM

## 2016-11-01 DIAGNOSIS — J301 Allergic rhinitis due to pollen: Secondary | ICD-10-CM

## 2016-11-01 DIAGNOSIS — J4540 Moderate persistent asthma, uncomplicated: Secondary | ICD-10-CM

## 2016-11-01 MED ORDER — FLUTICASONE PROPIONATE 50 MCG/ACT NA SUSP: 2.0000 | Freq: Every day | NASAL | 5 refills | Status: DC

## 2016-11-01 NOTE — Patient Instructions (Addendum)
Continue on her present treatment plan  Continue on her other medications Continue avoiding peanuts and tree nuts Call me if you're not doing well on this treatment plan

## 2016-11-01 NOTE — Progress Notes (Signed)
  [place] [address] Dept: [phone]  FOLLOW UP NOTE  Patient ID: Angela Jimenez, female    DOB: 04-22-1971  Age: 46 y.o. MRN: [id number] Date of Office Visit: 11/01/2016  Assessment  Chief Complaint: Asthma and Eye Pain  HPI ANALYN MATUSEK presents for follow-up of asthma, allergic rhinitis and food allergies. She was on prednisone for 6 weeks in March because of a bulging disc in her neck. Her asthma has been well controlled. Her allergic rhinitis is well controlled. She continues to avoid peanuts and tree nuts  Farrel Conners medications are Flovent 110-2 puffs twice a day, montelukast 10 mg once a day,  Ventolin 2 puffs every 4 hours if needed or instead albuterol 0.083% one unit dose every 4 hours if needed, levocetirizine 5 mg once a day , Benadryl and EpiPen 0.3 mg if needed for an allergic reaction and Pataday 1 drop once a day if needed. Her other medications are outlined in the chart   Drug Allergies:  Allergies  Allergen Reactions  . Amoxicillin Anaphylaxis  . Other Other (See Comments) and Hives    Tree nuts, anaphylaxis and migraine headache  . Peanut-Containing Drug Products Anaphylaxis  . Penicillins Anaphylaxis  . Wheat Bran   . Corn-Containing Products   . Sulfa Antibiotics     Other reaction(s): UNSPECIFIED    Physical Exam: BP 118/82   Pulse 82   Temp 98.3 F (36.8 C) (Oral)   Resp 16   Ht 5\' 3"  (1.6 m)   SpO2 98%    Physical Exam  Constitutional: She is oriented to person, place, and time. She appears well-developed and well-nourished.  HENT:  Eyes normal. Ears normal. Nose mild swelling of nasal  turbinates. Pharynx normal.  Neck: Neck supple.  Cardiovascular:  S1 and S2 normal no murmurs  Pulmonary/Chest:  Clear to percussion and auscultation  Lymphadenopathy:    She has no cervical adenopathy.  Neurological: She is alert and oriented to person, place, and time.  Psychiatric: She has a normal mood and affect. Her behavior is  normal. Judgment and thought content normal.  Vitals reviewed.   Diagnostics:  FVC 2.55 L FEV1 2.09 L. Predicted FVC 2.87 L predicted FEV1 2.32 L-the spirometry is in the normal range  Assessment and Plan: 1. Moderate persistent asthma without complication   2. Anaphylactic shock due to food, subsequent encounter   3. Seasonal allergic rhinitis due to pollen   4. Gastroesophageal reflux disease without esophagitis     Meds ordered this encounter  Medications  . fluticasone (FLONASE) 50 MCG/ACT nasal spray    Sig: Place 2 sprays into both nostrils daily.    Dispense:  16 g    Refill:  5    On hold, patient will call.    Patient Instructions  Continue on her present treatment plan  Continue on her other medications Continue avoiding peanuts and tree nuts Call me if you're not doing well on this treatment plan   No Follow-up on file.    Thank you for the opportunity to care for this patient.  Please do not hesitate to contact me with questions.  Penne Lash, M.D.  [facility] [address] [place], [place] [address] [phone]

## 2016-12-30 ENCOUNTER — Other Ambulatory Visit: Payer: Self-pay | Admitting: Pediatrics

## 2017-02-13 ENCOUNTER — Other Ambulatory Visit: Payer: Self-pay

## 2017-02-13 MED ORDER — LEVOCETIRIZINE DIHYDROCHLORIDE 5 MG PO TABS: 5.0000 mg | ORAL_TABLET | Freq: Every evening | ORAL | 5 refills | Status: DC

## 2017-04-26 ENCOUNTER — Other Ambulatory Visit: Payer: Self-pay

## 2017-04-26 MED ORDER — MONTELUKAST SODIUM 10 MG PO TABS: 10.0000 mg | ORAL_TABLET | Freq: Every day | ORAL | 1 refills | Status: DC

## 2017-05-01 ENCOUNTER — Ambulatory Visit: Payer: Medicare HMO | Admitting: Pediatrics

## 2017-05-02 ENCOUNTER — Other Ambulatory Visit: Payer: Self-pay | Admitting: Allergy

## 2017-05-02 MED ORDER — ALBUTEROL SULFATE HFA 108 (90 BASE) MCG/ACT IN AERS: 2.0000 | INHALATION_SPRAY | RESPIRATORY_TRACT | 2 refills | Status: DC | PRN

## 2017-05-02 MED ORDER — FLUTICASONE PROPIONATE HFA 110 MCG/ACT IN AERO: 2.0000 | INHALATION_SPRAY | Freq: Two times a day (BID) | RESPIRATORY_TRACT | 2 refills | Status: DC

## 2017-05-29 ENCOUNTER — Ambulatory Visit: Payer: Medicare HMO | Admitting: Pediatrics

## 2017-06-13 ENCOUNTER — Encounter: Payer: Self-pay | Admitting: Pediatrics

## 2017-06-13 ENCOUNTER — Ambulatory Visit (INDEPENDENT_AMBULATORY_CARE_PROVIDER_SITE_OTHER): Payer: PPO | Admitting: Pediatrics

## 2017-06-13 VITALS — BP 130/86 | HR 78 | Temp 98.5°F | Resp 16 | Ht 62.0 in | Wt 151.0 lb

## 2017-06-13 DIAGNOSIS — J4540 Moderate persistent asthma, uncomplicated: Secondary | ICD-10-CM | POA: Diagnosis not present

## 2017-06-13 DIAGNOSIS — J301 Allergic rhinitis due to pollen: Secondary | ICD-10-CM | POA: Diagnosis not present

## 2017-06-13 DIAGNOSIS — T7800XD Anaphylactic reaction due to unspecified food, subsequent encounter: Secondary | ICD-10-CM

## 2017-06-13 MED ORDER — FLUTICASONE PROPIONATE HFA 110 MCG/ACT IN AERO: 2.0000 | INHALATION_SPRAY | Freq: Two times a day (BID) | RESPIRATORY_TRACT | 5 refills | Status: DC

## 2017-06-13 MED ORDER — PATADAY 0.2 % OP SOLN: OPHTHALMIC | 5 refills | Status: DC

## 2017-06-13 MED ORDER — FLUTICASONE PROPIONATE 50 MCG/ACT NA SUSP: 2.0000 | Freq: Every day | NASAL | 5 refills | Status: DC

## 2017-06-13 MED ORDER — ALBUTEROL SULFATE HFA 108 (90 BASE) MCG/ACT IN AERS: 2.0000 | INHALATION_SPRAY | RESPIRATORY_TRACT | 2 refills | Status: DC | PRN

## 2017-06-13 MED ORDER — EPINEPHRINE 0.3 MG/0.3ML IJ SOAJ: INTRAMUSCULAR | 2 refills | Status: DC

## 2017-06-13 MED ORDER — LEVOCETIRIZINE DIHYDROCHLORIDE 5 MG PO TABS: 5.0000 mg | ORAL_TABLET | Freq: Every evening | ORAL | 5 refills | Status: DC

## 2017-06-13 MED ORDER — MONTELUKAST SODIUM 10 MG PO TABS: 10.0000 mg | ORAL_TABLET | Freq: Every day | ORAL | 3 refills | Status: DC

## 2017-06-13 NOTE — Patient Instructions (Signed)
Levocetirizine 5 mg once a day but stop 3 days before allergy testing Pataday 1 drop once a day if needed for itchy eyes Montelukast 10 mg once a day for coughing or wheezing Flovent 110-2 puffs twice a day  Ventolin 2 puffs every 4 hours if needed or instead albuterol 0.083% one unit dose every 4 hours if needed but she may follow the guidelines of the study that she is on [place] with the Qvar-albuterol rescue inhaler  Avoid peanuts and tree nuts, wheat and corn. If she has an allergic reaction give Benadryl 50 mg every 4 hours and if she has life-threatening symptoms inject with EpiPen 0.3 mg Continue on her other medications Follow-up to do allergy skin testing and possibly a meloxicam challenge

## 2017-06-13 NOTE — Progress Notes (Signed)
[place] [address] Dept: [phone]  FOLLOW UP NOTE  Patient ID: Angela Jimenez, female    DOB: Dec 08, 1970  Age: 47 y.o. MRN: [id number] Date of Office Visit: 06/13/2017  Assessment  Chief Complaint: Asthma and Allergies  HPI Angela Jimenez presents for follow-up of asthma, allergic rhinitis and food allergies. Her asthma is well controlled by using Flovent 110-2 puffs twice a day and Ventolin 2 puffs every 4 hours if needed. If she needs Ventolin inhaler, she has a Qvar Redihaler 80 with a rescue inhaler as part of a  study in [place]. Her nasal symptoms are well controlled. She continues to avoid peanuts and tree nuts and is concerned that she may be allergic to corn. Corn products give her abdominal pain and indigestion. She has been avoiding wheat products It has been several years since she had allergy skin testing. Last year after being on prednisone for over a month she developed glaucoma. She has a torn left ligament in her hand. Nonsteroidal medications may be giving her some shortness of breath and heartburn but no hives  Her other medications are outlined in the chart   Drug Allergies:  Allergies  Allergen Reactions  . Amoxicillin Anaphylaxis  . Other Other (See Comments) and Hives    Tree nuts, anaphylaxis and migraine headache  . Peanut-Containing Drug Products Anaphylaxis  . Penicillins Anaphylaxis  . Wheat Bran   . Prednisone Other (See Comments)    "Makes me numb and tingly" increases eye pressure.   . Corn-Containing Products   . Sulfa Antibiotics     Other reaction(s): UNSPECIFIED    Physical Exam: BP 130/86   Pulse 78   Temp 98.5 F (36.9 C) (Oral)   Resp 16   Ht 5\' 2"  (1.575 m)   Wt 151 lb (68.5 kg)   SpO2 97%   BMI 27.62 kg/m    Physical Exam  Constitutional: She is oriented to person, place, and time. She appears well-developed and well-nourished.  HENT:  Eyes normal. Ears normal. Nose normal. Pharynx normal.  Neck:  Neck supple.  Cardiovascular:  S1 and S2 normal no murmurs  Pulmonary/Chest:  Clear to percussion and auscultation  Lymphadenopathy:    She has no cervical adenopathy.  Neurological: She is alert and oriented to person, place, and time.  Skin:  Clear  Psychiatric: She has a normal mood and affect. Her behavior is normal. Judgment and thought content normal.  Vitals reviewed.   Diagnostics:  FVC 2.48 L FEV1 2.08 L. Predicted FVC 2.74 L predicted FEV1 2.22 L-the spirometry is in the normal range  Assessment and Plan: 1. Moderate persistent asthma without complication   2. Anaphylactic shock due to food, subsequent encounter   3. Seasonal allergic rhinitis due to pollen     Meds ordered this encounter  Medications  . fluticasone (FLOVENT HFA) 110 MCG/ACT inhaler    Sig: Inhale 2 puffs into the lungs 2 (two) times daily.    Dispense:  1 Inhaler    Refill:  5  . levocetirizine (XYZAL) 5 MG tablet    Sig: Take 1 tablet (5 mg total) by mouth every evening.    Dispense:  30 tablet    Refill:  5  . montelukast (SINGULAIR) 10 MG tablet    Sig: Take 1 tablet (10 mg total) by mouth at bedtime.    Dispense:  90 tablet    Refill:  3  . fluticasone (FLONASE) 50 MCG/ACT nasal spray  Sig: Place 2 sprays into both nostrils daily.    Dispense:  16 g    Refill:  5    On hold, patient will call.  Marland Kitchen EPINEPHrine (EPIPEN 2-PAK) 0.3 mg/0.3 mL IJ SOAJ injection    Sig: Use as directed for severe allergic reactions    Dispense:  2 Device    Refill:  2  . albuterol (VENTOLIN HFA) 108 (90 Base) MCG/ACT inhaler    Sig: Inhale 2 puffs into the lungs every 4 (four) hours as needed for wheezing or shortness of breath.    Dispense:  1 Inhaler    Refill:  2  . PATADAY 0.2 % SOLN    Sig: ONE DROP EACH EYE ONCE A DAY FOR ITCHY EYES    Dispense:  2.5 mL    Refill:  5    Patient Instructions  Levocetirizine 5 mg once a day but stop 3 days before allergy testing Pataday 1 drop once a day if  needed for itchy eyes Montelukast 10 mg once a day for coughing or wheezing Flovent 110-2 puffs twice a day  Ventolin 2 puffs every 4 hours if needed or instead albuterol 0.083% one unit dose every 4 hours if needed but she may follow the guidelines of the study that she is on [place] with the Qvar-albuterol rescue inhaler  Avoid peanuts and tree nuts, wheat and corn. If she has an allergic reaction give Benadryl 50 mg every 4 hours and if she has life-threatening symptoms inject with EpiPen 0.3 mg Continue on her other medications Follow-up to do allergy skin testing and possibly a meloxicam challenge   Return in about 4 weeks (around 07/11/2017).    Thank you for the opportunity to care for this patient.  Please do not hesitate to contact me with questions.  Penne Lash, M.D.  [facility] [address] [place], [place] [address] [phone]

## 2017-06-14 ENCOUNTER — Other Ambulatory Visit: Payer: Self-pay

## 2017-06-14 NOTE — Telephone Encounter (Signed)
error 

## 2017-06-26 ENCOUNTER — Ambulatory Visit (INDEPENDENT_AMBULATORY_CARE_PROVIDER_SITE_OTHER): Payer: PPO | Admitting: Pediatrics

## 2017-06-26 ENCOUNTER — Encounter: Payer: Self-pay | Admitting: Pediatrics

## 2017-06-26 VITALS — BP 124/76 | HR 80 | Temp 98.6°F | Resp 16 | Ht 62.0 in | Wt 151.0 lb

## 2017-06-26 DIAGNOSIS — J301 Allergic rhinitis due to pollen: Secondary | ICD-10-CM | POA: Diagnosis not present

## 2017-06-26 DIAGNOSIS — T7800XD Anaphylactic reaction due to unspecified food, subsequent encounter: Secondary | ICD-10-CM | POA: Diagnosis not present

## 2017-06-26 DIAGNOSIS — J4540 Moderate persistent asthma, uncomplicated: Secondary | ICD-10-CM

## 2017-06-26 NOTE — Progress Notes (Signed)
[place] [address] Dept: [phone]  FOLLOW UP NOTE  Patient ID: Angela Jimenez, female    DOB: [birth year]  Age: 47 y.o. MRN: [id number] Date of Office Visit: 06/26/2017  Assessment  Chief Complaint: Allergy Testing  HPI Angela Jimenez presents for allergy testing. She has not had allergy testing for several years. She has been avoiding peanuts, tree nuts and wheat. These foods give her migraine-like headaches.: Corn products give her severe abdominal pain and back pain .   Drug Allergies:  Allergies  Allergen Reactions  . Amoxicillin Anaphylaxis  . Other Other (See Comments) and Hives    Tree nuts, anaphylaxis and migraine headache  . Peanut-Containing Drug Products Anaphylaxis  . Penicillins Anaphylaxis  . Wheat Bran   . Prednisone Other (See Comments)    "Makes me numb and tingly" increases eye pressure.   . Corn-Containing Products   . Sulfa Antibiotics     Other reaction(s): UNSPECIFIED    Physical Exam: BP 124/76 (BP Location: Right Arm, Patient Position: Sitting, Cuff Size: Normal)   Pulse 80   Temp 98.6 F (37 C) (Oral)   Resp 16   Ht 5\' 2"  (1.575 m)   Wt 151 lb (68.5 kg)   SpO2 99%   BMI 27.62 kg/m    Physical Exam  Constitutional: She is oriented to person, place, and time. She appears well-developed and well-nourished.  HENT:  Eyes normal. Ears normal. Nose normal. Pharynx normal.  Neck: Neck supple.  Cardiovascular:  S1 and S2 normal no murmurs  Pulmonary/Chest:  Clear to percussion and auscultation  Lymphadenopathy:    She has no cervical adenopathy.  Neurological: She is alert and oriented to person, place, and time.  Psychiatric: She has a normal mood and affect. Her behavior is normal. Judgment and thought content normal.  Vitals reviewed.   Diagnostics:  FVC 2.59 L FEV1 2.16 L. Predicted FVC 2.74 L predicted FEV1 2.22 L-the spirometry is in the normal range but  Allergy skin tests were positive to grass pollens,  weeds, tree pollens. Slight reactions noted some molds. Skin testing to foods was negative  Assessment and Plan: 1. Anaphylaxis due to food, subsequent encounter   2. Moderate persistent asthma without complication   3. Seasonal allergic rhinitis due to pollen        Patient Instructions  Environmental control of dust and mold Levocetirizine 5 mg once a day Pataday 1 drop once a day if needed for itchy eyes Montelukast 10 mg once a day for coughing or wheezing Flovent 110-2 puffs twice a day to prevent coughing or wheezing Ventolin 2 puffs every 4 hours if needed for wheezing or coughing spells or instead albuterol 0.083% one unit dose every 4 hours if needed Continue avoiding wheat and corn, peanuts and tree nuts If she has an allergic reaction give Benadryl 50 mg every 4 hours and if she has life-threatening symptoms  inject with EpiPen 0.3 mg I will call you with the results of the blood test for peanut and tree nut allergy In the future we could do an oral challenge to meloxicam. Nonsteroidal medications in the past have given her shortness of breath but no itching I gave her a list of foods associated with migraine headaches    Return in about 4 weeks (around 07/24/2017).    Thank you for the opportunity to care for this patient.  Please do not hesitate to contact me with questions.  Penne Lash, M.D.  [facility] [address] [place], [place] [address] [phone]

## 2017-06-26 NOTE — Patient Instructions (Addendum)
Environmental control of dust and mold Levocetirizine 5 mg once a day Pataday 1 drop once a day if needed for itchy eyes Montelukast 10 mg once a day for coughing or wheezing Flovent 110-2 puffs twice a day to prevent coughing or wheezing Ventolin 2 puffs every 4 hours if needed for wheezing or coughing spells or instead albuterol 0.083% one unit dose every 4 hours if needed Continue avoiding wheat and corn, peanuts and tree nuts If she has an allergic reaction give Benadryl 50 mg every 4 hours and if she has life-threatening symptoms  inject with EpiPen 0.3 mg I will call you with the results of the blood test for peanut and tree nut allergy In the future we could do an oral challenge to meloxicam. Nonsteroidal medications in the past have given her shortness of breath but no itching I gave her a list of foods associated with migraine headaches

## 2017-06-29 LAB — IGE PEANUT COMPONENT PROFILE
F352-IgE Ara h 8: <0.1 kU/L
F422-IgE Ara h 1: <0.1 kU/L
F423-IgE Ara h 2: <0.1 kU/L
F424-IgE Ara h 3: <0.1 kU/L
F427-IgE Ara h 9: <0.1 kU/L
F447-IgE Ara h 6: <0.1 kU/L

## 2017-06-29 LAB — ALLERGENS(7)
Brazil Nut IgE: <0.1 kU/L
F020-IgE Almond: <0.1 kU/L
F202-IgE Cashew Nut: <0.1 kU/L
Hazelnut (Filbert) IgE: <0.1 kU/L
Peanut IgE: <0.1 kU/L
Pecan Nut IgE: <0.1 kU/L
Walnut IgE: <0.1 kU/L

## 2017-06-29 LAB — ALLERGEN COCONUT IGE: Allergen Coconut IgE: <0.1 kU/L

## 2017-06-29 LAB — ALLERGEN PISTACHIO F203: F203-IgE Pistachio Nut: <0.1 kU/L

## 2017-06-30 ENCOUNTER — Telehealth: Payer: Self-pay | Admitting: Allergy & Immunology

## 2017-06-30 NOTE — Telephone Encounter (Signed)
Requesting test results.

## 2017-06-30 NOTE — Telephone Encounter (Signed)
Informed pt that dr B has her results but has not relayed them to the nurses yet. Once a nurse has the results we will call her.

## 2017-07-05 ENCOUNTER — Other Ambulatory Visit: Payer: Self-pay | Admitting: Pediatrics

## 2017-07-14 DIAGNOSIS — M542 Cervicalgia: Secondary | ICD-10-CM | POA: Diagnosis not present

## 2017-07-14 DIAGNOSIS — M546 Pain in thoracic spine: Secondary | ICD-10-CM | POA: Diagnosis not present

## 2017-07-14 DIAGNOSIS — N62 Hypertrophy of breast: Secondary | ICD-10-CM | POA: Diagnosis not present

## 2017-07-14 DIAGNOSIS — G8929 Other chronic pain: Secondary | ICD-10-CM | POA: Diagnosis not present

## 2017-07-18 ENCOUNTER — Encounter: Payer: Self-pay | Admitting: Family Medicine

## 2017-07-18 ENCOUNTER — Ambulatory Visit (INDEPENDENT_AMBULATORY_CARE_PROVIDER_SITE_OTHER): Payer: PPO | Admitting: Family Medicine

## 2017-07-18 VITALS — BP 118/76 | HR 86 | Temp 98.3°F | Resp 16

## 2017-07-18 DIAGNOSIS — M542 Cervicalgia: Secondary | ICD-10-CM | POA: Insufficient documentation

## 2017-07-18 DIAGNOSIS — J4540 Moderate persistent asthma, uncomplicated: Secondary | ICD-10-CM

## 2017-07-18 DIAGNOSIS — K219 Gastro-esophageal reflux disease without esophagitis: Secondary | ICD-10-CM

## 2017-07-18 DIAGNOSIS — N62 Hypertrophy of breast: Secondary | ICD-10-CM | POA: Insufficient documentation

## 2017-07-18 DIAGNOSIS — J301 Allergic rhinitis due to pollen: Secondary | ICD-10-CM

## 2017-07-18 DIAGNOSIS — M546 Pain in thoracic spine: Secondary | ICD-10-CM

## 2017-07-18 DIAGNOSIS — G8929 Other chronic pain: Secondary | ICD-10-CM | POA: Insufficient documentation

## 2017-07-18 MED ORDER — ALBUTEROL SULFATE HFA 108 (90 BASE) MCG/ACT IN AERS
2.0000 | INHALATION_SPRAY | RESPIRATORY_TRACT | 3 refills | Status: DC | PRN
Start: 2017-07-18 — End: 2017-09-18

## 2017-07-18 MED ORDER — IPRATROPIUM BROMIDE 0.03 % NA SOLN: 2.0000 | Freq: Two times a day (BID) | NASAL | 12 refills | Status: DC

## 2017-07-18 NOTE — Progress Notes (Signed)
[place] [address] Dept: [phone]  FOLLOW UP NOTE  Patient ID: Angela Jimenez, female    DOB: [birth year]  Age: 47 y.o. MRN: [id number] Date of Office Visit: 07/18/2017  Assessment  Chief Complaint: Allergies; Migraine; and Nasal Congestion  HPI Angela Jimenez is a 47 year old female who presents to the clinic for a sick visit. She was last seen in this clinic on 07/16/2016 by Dr. Shaune Leeks for evaluation of food allergies, asthma, and allergic rhinitis. At that visit, her skin testing was positive for tree pollens, grass pollens, weed pollens, and some molds. Her skin testing to foods was negative. Her blood testing to foods was also negative.   At today's visit, she reports that on Saturday she went outside in the wind and later in the evening developed symptoms including feeling hot and cold, thick postnasal drainage, increased throat clearing, shortness of breath with activity, slight sinus pressure and headache.  Chest tightness and non-productive cough.  She denies fever, however, she does report many sick contacts at her family home and at her work.  She did not get the flu shot this year.  She is currently taking Flovent 110-2 puffs once or twice a day, Ventolin and Qvar 1-2 times a day beginning on Sunday.  She is also currently taking Xyzal 5 mg once a day, Benadryl as needed, Sudafed as needed, and NyQuil cold and flu as needed.  Gastroesophageal reflux is reported as well controlled with Protonix 40 mg once a day.  Her current medications are listed in the chart.   Drug Allergies:  Allergies  Allergen Reactions  . Amoxicillin Anaphylaxis  . Other Other (See Comments) and Hives    Tree nuts, anaphylaxis and migraine headache  . Peanut-Containing Drug Products Anaphylaxis  . Penicillins Anaphylaxis  . Wheat Bran   . Prednisone Other (See Comments)    "Makes me numb and tingly" increases eye pressure.   . Corn-Containing Products   . Sulfa  Antibiotics     Other reaction(s): UNSPECIFIED    Physical Exam: BP 118/76   Pulse 86   Temp 98.3 F (36.8 C) (Oral)   Resp 16   SpO2 97%    Physical Exam  Constitutional: She is oriented to person, place, and time. She appears well-developed and well-nourished.  HENT:  Bilateral nares slightly erythematous and edematous with clear nasal drainage noted.  Bilateral ears with clear fluid effusion.  Pharynx slightly erythematous with no exudate noted.  Eyes normal.  Eyes: Conjunctivae are normal.  Neck: Normal range of motion. Neck supple.  Cardiovascular: Normal rate, regular rhythm and normal heart sounds.  S1-S2 normal.  Regular heart rate and rhythm.  No murmur noted.  Pulmonary/Chest: Effort normal and breath sounds normal.  Lungs clear to auscultation  Musculoskeletal: Normal range of motion.  Neurological: She is alert and oriented to person, place, and time.  Skin: Skin is warm and dry.  Psychiatric: She has a normal mood and affect. Her behavior is normal.    Diagnostics: FVC 2.82, FEV1 2.34.  Predicted FVC 2.74, predicted FEV1 2.22.  Spirometry reveals normal ventilatory function.  Assessment and Plan: 1. Moderate persistent asthma without complication   2. Seasonal allergic rhinitis due to pollen   3. Gastroesophageal reflux disease without esophagitis     Meds ordered this encounter  Medications  . albuterol (PROAIR HFA) 108 (90 Base) MCG/ACT inhaler    Sig: Inhale 2 puffs into the lungs every 4 (four) hours  as needed for wheezing or shortness of breath.    Dispense:  1 Inhaler    Refill:  3  . ipratropium (ATROVENT) 0.03 % nasal spray    Sig: Place 2 sprays into both nostrils 2 (two) times daily.    Dispense:  30 mL    Refill:  12    Patient Instructions  1. Moderate persistent asthma without complication - Continue Flovent 110 - 2 puffs twice a day to prevent cough, wheeze, and shortness of breath - ProAir 2 puffs every 4 hours as needed to prevent  cough, wheeze, and shortness of breath - Qvar according to your study - Allergen avoidance - Continue montelukast 10 mg once a day  2. Seasonal allergic rhinitis due to pollen - Stop taking Xyzal. Begin Zyrtec 10 mg once a day for a runny nose. Begin this in about 1 week. . - Mucinex 400 mg 1 tablet every 8 hours to thin nasal secretions - Increase fluid intake as tolerated - Consider nasal saline rinses daily - Atrovent nasal spray 0.03%. 2 sprays in each nostril twice a day - Prednisone 10 mg tablets. Take 2 tablets once a day for 4 days, then take 1 tablet for 1 day, then stop  3. GERD - Continue Protonix 40 mg once a day  Follow up in 2 months or sooner if needed   Return in about 2 months (around 09/15/2017), or if symptoms worsen or fail to improve.    Thank you for the opportunity to care for this patient.  Please do not hesitate to contact me with questions.  Gareth Morgan, FNP [facility] [place] [facility]

## 2017-07-18 NOTE — Patient Instructions (Addendum)
1. Moderate persistent asthma without complication - Continue Flovent 110 - 2 puffs twice a day to prevent cough, wheeze, and shortness of breath - ProAir 2 puffs every 4 hours as needed to prevent cough, wheeze, and shortness of breath - Qvar according to your study - Allergen avoidance - Continue montelukast 10 mg once a day  2. Seasonal allergic rhinitis due to pollen - Stop taking Xyzal. Begin Zyrtec 10 mg once a day for a runny nose. Begin this in about 1 week. . - Mucinex 400 mg 1 tablet every 8 hours to thin nasal secretions - Increase fluid intake as tolerated - Consider nasal saline rinses daily - Atrovent nasal spray 0.03%. 2 sprays in each nostril twice a day - Prednisone 10 mg tablets. Take 2 tablets once a day for 4 days, then take 1 tablet for 1 day, then stop  3. GERD - Continue Protonix 40 mg once a day  Follow up in 2 months or sooner if needed

## 2017-07-19 ENCOUNTER — Telehealth: Payer: Self-pay | Admitting: Allergy

## 2017-07-19 DIAGNOSIS — R059 Cough, unspecified: Secondary | ICD-10-CM

## 2017-07-19 DIAGNOSIS — R05 Cough: Secondary | ICD-10-CM

## 2017-07-19 MED ORDER — CARBINOXAMINE MALEATE 4 MG PO TABS: 8.0000 mg | ORAL_TABLET | Freq: Three times a day (TID) | ORAL | 0 refills | Status: DC

## 2017-07-19 MED ORDER — BENZONATATE 100 MG PO CAPS: 100.0000 mg | ORAL_CAPSULE | Freq: Three times a day (TID) | ORAL | 0 refills | Status: DC | PRN

## 2017-07-19 NOTE — Telephone Encounter (Signed)
Kaleigh called and said her chest is hurting and can't control cough.No fever. Having trouble breathing. Could you call in something for cough.[facility] [place] [place] Phone number is [phone].

## 2017-07-19 NOTE — Addendum Note (Signed)
Addended by: Dara Hoyer on: 07/19/2017 10:45 AM   Modules accepted: Orders

## 2017-07-19 NOTE — Telephone Encounter (Signed)
Informed patient we were calling in prescriptions and if not better by Friday would need chest x-ray.

## 2017-07-19 NOTE — Telephone Encounter (Signed)
Can you please let her know I am calling in carbinoxamine and Tessalon Perles to help with her cough. If this doesn't help by Friday, I will need her to go for a chest xray. Thank you

## 2017-07-21 NOTE — Telephone Encounter (Signed)
We received a fax from the patient's pharmacy requesting a prescription for cough syrup. I called the patient after reviewing the notes in her chart. I advised her that tessalon perles were sent in. She stated that she was still no better. I ordered a chest xray per the last telephone contact with her. She will have that done at [facility] [address].

## 2017-07-21 NOTE — Addendum Note (Signed)
Addended by: Lucrezia Starch I on: 07/21/2017 09:08 AM   Modules accepted: Orders

## 2017-07-24 ENCOUNTER — Ambulatory Visit
Admission: RE | Admit: 2017-07-24 | Discharge: 2017-07-24 | Disposition: A | Discharge status: Home / Self Care | Payer: PPO | Source: Ambulatory Visit | Attending: Family Medicine | Admitting: Family Medicine

## 2017-07-24 DIAGNOSIS — R059 Cough, unspecified: Secondary | ICD-10-CM

## 2017-07-24 DIAGNOSIS — R05 Cough: Secondary | ICD-10-CM

## 2017-07-25 ENCOUNTER — Encounter: Payer: Self-pay | Admitting: Pediatrics

## 2017-07-25 ENCOUNTER — Ambulatory Visit (INDEPENDENT_AMBULATORY_CARE_PROVIDER_SITE_OTHER): Payer: PPO | Admitting: Pediatrics

## 2017-07-25 ENCOUNTER — Ambulatory Visit: Payer: PPO | Admitting: Pediatrics

## 2017-07-25 VITALS — BP 120/66 | HR 88 | Temp 98.1°F | Resp 16

## 2017-07-25 DIAGNOSIS — J301 Allergic rhinitis due to pollen: Secondary | ICD-10-CM

## 2017-07-25 DIAGNOSIS — K219 Gastro-esophageal reflux disease without esophagitis: Secondary | ICD-10-CM

## 2017-07-25 DIAGNOSIS — J4540 Moderate persistent asthma, uncomplicated: Secondary | ICD-10-CM | POA: Diagnosis not present

## 2017-07-25 MED ORDER — CLARITHROMYCIN 250 MG PO TABS: 250.0000 mg | ORAL_TABLET | Freq: Two times a day (BID) | ORAL | Status: DC

## 2017-07-25 MED ORDER — CLARITHROMYCIN 250 MG PO TABS: ORAL_TABLET | ORAL | 0 refills | Status: DC

## 2017-07-25 NOTE — Addendum Note (Signed)
Addended by: Katherina Right D on: 07/25/2017 02:57 PM   Modules accepted: Orders

## 2017-07-25 NOTE — Patient Instructions (Addendum)
Zyrtec 10 mg-take 1 tablet once a day for runny nose or itchy eyes Nasacort 1 spray per nostril twice a day for stuffy nose Nasal saline irrigations at night before using Nasacort Montelukast  10 mg-take 1 tablet once a day for coughing or wheezing Flovent 110-2 puffs twice a day to prevent coughing or wheezing Complete the course of Biaxin which was prescribed Robitussin-DM at night if needed for cough Add prednisone 10 mg twice a day for 4 days, 10 mg on the fifth day if you're having severe allergic symptoms over the next few weeks. Let us know if you use prednisone Call us if you're not doing well on this treatment plan Continue on your other medications  If you are concerned about a wheat  allergy, we could do an oral challenge in July or in the winter when you're not symptomatic from an allergy season

## 2017-07-25 NOTE — Progress Notes (Signed)
[place] [address] Dept: [phone]  FOLLOW UP NOTE  Patient ID: Angela Jimenez, female    DOB: [birth year]  Age: 47 y.o. MRN: [id number] Date of Office Visit: 07/25/2017  Assessment  Chief Complaint: No chief complaint on file.  HPI Angela Jimenez presents for evaluation of a cough, nasal congestion and postnasal drainage. She had a recent chest x-ray which was normal. She tolerated prednisone 10 mg twice a day for 4 days, 10 mg on the fifth day last week without any problems. She has been having body aches for the past 2 weeks or so. She never had a flu vaccination. She has had negative skin testing to foods and negative blood allergy testing to foods of concern. She has migraine headaches and some of these foods give her migraine headaches. She ate a doughnut once a few weeks ago and developed some irritation of her throat. She has been having more nasal congestion. She is allergic to tree pollens, grass pollens ,weed pollens ,and some molds. She has been avoiding wheat products, because she feels better when she avoids wheat. She started Biaxin 250 mg every 12 hours this morning   Drug Allergies:  Allergies  Allergen Reactions  . Amoxicillin Anaphylaxis  . Other Other (See Comments) and Hives    Tree nuts, anaphylaxis and migraine headache  . Peanut-Containing Drug Products Anaphylaxis  . Penicillins Anaphylaxis  . Wheat Bran   . Prednisone Other (See Comments)    "Makes me numb and tingly" increases eye pressure.   . Corn-Containing Products   . Sulfa Antibiotics     Other reaction(s): UNSPECIFIED    Physical Exam: BP 120/66   Pulse 88   Temp 98.1 F (36.7 C) (Oral)   Resp 16   SpO2 98%    Physical Exam  Constitutional: She is oriented to person, place, and time. She appears well-developed and well-nourished.  HENT:  Eyes normal. Ears normal. Nose mild swelling of nasal turbinates with clear nasal discharge. Pharynx normal.  Neck: Neck supple.   Cardiovascular:  S1 and S2 normal no murmurs  Pulmonary/Chest:  Clear to percussion and auscultation  Lymphadenopathy:    She has no cervical adenopathy.  Neurological: She is alert and oriented to person, place, and time.  Psychiatric: She has a normal mood and affect. Her behavior is normal. Judgment and thought content normal.  Vitals reviewed.   Diagnostics:  FVC 2.72 L FEV1 2.25 L. Predicted FVC 2.74 L predicted FEV1 2.22 L-the spirometry is in the normal range  Assessment and Plan: 1. Moderate persistent asthma without complication   2. Seasonal allergic rhinitis due to pollen   3. Gastroesophageal reflux disease without esophagitis     No orders of the defined types were placed in this encounter.   Patient Instructions  Zyrtec 10 mg-take 1 tablet once a day for runny nose or itchy eyes Nasacort 1 spray per nostril twice a day for stuffy nose Nasal saline irrigations at night before using Nasacort Montelukast  10 mg-take 1 tablet once a day for coughing or wheezing Flovent 110-2 puffs twice a day to prevent coughing or wheezing Complete the course of Biaxin which was prescribed Robitussin-DM at night if needed for cough Add prednisone 10 mg twice a day for 4 days, 10 mg on the fifth day if you're having severe allergic symptoms over the next few weeks. Let us know if you use prednisone Call us if you're not doing well on this treatment plan  Continue on your other medications  If you are concerned about a wheat  allergy, we could do an oral challenge in July or in the winter when you're not symptomatic from an allergy season   Return in about 2 months (around 09/24/2017).    Thank you for the opportunity to care for this patient.  Please do not hesitate to contact me with questions.  Penne Lash, M.D.  [facility] [address] [place], [place] [address] [phone]

## 2017-07-25 NOTE — Addendum Note (Signed)
Addended by: Dara Hoyer on: 07/25/2017 09:44 AM   Modules accepted: Orders

## 2017-07-26 ENCOUNTER — Telehealth: Payer: Self-pay | Admitting: Allergy

## 2017-07-26 ENCOUNTER — Other Ambulatory Visit: Payer: Self-pay | Admitting: Allergy

## 2017-07-26 NOTE — Telephone Encounter (Signed)
Patient may add prednisone 10 mg twice a day for 4 days, 10 mg on the fifth day. She has a small Pak with the prednisone. She will continue on all her other medications

## 2017-07-26 NOTE — Telephone Encounter (Signed)
Dr. Shaune Leeks. Angela Jimenez  Called and said she was still coughing and wheezing a lot. [name] wrote a prescription for tussinex. Patient came and picked up prescription

## 2017-07-27 ENCOUNTER — Other Ambulatory Visit: Payer: Self-pay

## 2017-07-27 MED ORDER — PREDNISONE 10 MG PO TABS: ORAL_TABLET | ORAL | 0 refills | Status: DC

## 2017-07-27 NOTE — Telephone Encounter (Signed)
Sent in rx and informed pt 

## 2017-08-01 ENCOUNTER — Telehealth: Payer: Self-pay | Admitting: Allergy

## 2017-08-01 NOTE — Telephone Encounter (Signed)
Left message for patient to call office.  

## 2017-08-01 NOTE — Telephone Encounter (Signed)
-----   Message from Dara Hoyer, FNP sent at 07/19/2017 10:44 AM EST ----- Can you please call this patient and ask about her pregnancy status in regard to the medications I have just called in? Thank you

## 2017-08-02 ENCOUNTER — Telehealth: Payer: Self-pay | Admitting: Allergy

## 2017-08-02 NOTE — Telephone Encounter (Signed)
Called Angela Jimenez back said  she was doing better. Finished the prednisone still on abx. But was feeling better.

## 2017-08-03 DIAGNOSIS — M5441 Lumbago with sciatica, right side: Secondary | ICD-10-CM | POA: Diagnosis not present

## 2017-08-03 DIAGNOSIS — E559 Vitamin D deficiency, unspecified: Secondary | ICD-10-CM | POA: Diagnosis not present

## 2017-08-03 DIAGNOSIS — D649 Anemia, unspecified: Secondary | ICD-10-CM | POA: Diagnosis not present

## 2017-08-03 DIAGNOSIS — Z0000 Encounter for general adult medical examination without abnormal findings: Secondary | ICD-10-CM | POA: Diagnosis not present

## 2017-08-03 DIAGNOSIS — Z130 Encounter for screening for diseases of the blood and blood-forming organs and certain disorders involving the immune mechanism: Secondary | ICD-10-CM | POA: Diagnosis not present

## 2017-08-03 DIAGNOSIS — G8929 Other chronic pain: Secondary | ICD-10-CM | POA: Diagnosis not present

## 2017-08-03 DIAGNOSIS — Z8639 Personal history of other endocrine, nutritional and metabolic disease: Secondary | ICD-10-CM | POA: Diagnosis not present

## 2017-08-03 DIAGNOSIS — Z131 Encounter for screening for diabetes mellitus: Secondary | ICD-10-CM | POA: Diagnosis not present

## 2017-08-03 DIAGNOSIS — M25552 Pain in left hip: Secondary | ICD-10-CM | POA: Diagnosis not present

## 2017-08-03 DIAGNOSIS — E538 Deficiency of other specified B group vitamins: Secondary | ICD-10-CM | POA: Diagnosis not present

## 2017-08-03 DIAGNOSIS — R5383 Other fatigue: Secondary | ICD-10-CM | POA: Diagnosis not present

## 2017-08-03 DIAGNOSIS — M5442 Lumbago with sciatica, left side: Secondary | ICD-10-CM | POA: Diagnosis not present

## 2017-08-03 DIAGNOSIS — Z13220 Encounter for screening for lipoid disorders: Secondary | ICD-10-CM | POA: Diagnosis not present

## 2017-08-03 NOTE — Telephone Encounter (Signed)
Thank you :)

## 2017-08-07 ENCOUNTER — Telehealth: Payer: Self-pay | Admitting: *Deleted

## 2017-08-07 MED ORDER — EPINEPHRINE 0.3 MG/0.3ML IJ SOAJ: 0.3000 mg | Freq: Once | INTRAMUSCULAR | 2 refills | Status: AC

## 2017-08-07 NOTE — Telephone Encounter (Signed)
Called patient and made her aware of Anne's instructions. Patient will pick up EpiPen and continue to avoid foods and call us if she has any other problems.

## 2017-08-07 NOTE — Telephone Encounter (Signed)
I talked to Angela Jimenez and she reports having nausea and shortness of breath all weekend after she had almond milk. She did not fill or pick up the most current EpiPen that was prescribed on 06/13/2017.

## 2017-08-07 NOTE — Telephone Encounter (Signed)
Patient called stating she has been sick all weekend. Patient said she bought a different brand of almond milk and ever since, shes been very very sick. She thinks she is still allergic to nuts. She tried to use her epipen but it expired in 2016. Patient is wanting a return call from Angela Jimenez so she can ask for advice. She also needs a new EpiPen prescription

## 2017-08-07 NOTE — Telephone Encounter (Signed)
Made in error

## 2017-08-07 NOTE — Telephone Encounter (Signed)
Can you please call this patient and tall her to avoid almond milk and any other foods that aggregate her asthma. I will send in another EpiPen to her pharmacy. If she visits [email or web address] she may qualify to save some money. Thank you

## 2017-08-08 ENCOUNTER — Encounter: Payer: Self-pay | Admitting: Family Medicine

## 2017-08-08 ENCOUNTER — Ambulatory Visit (INDEPENDENT_AMBULATORY_CARE_PROVIDER_SITE_OTHER): Payer: PPO | Admitting: Family Medicine

## 2017-08-08 VITALS — BP 118/90 | HR 86 | Temp 98.2°F | Resp 16 | Ht 62.1 in | Wt 142.4 lb

## 2017-08-08 DIAGNOSIS — J302 Other seasonal allergic rhinitis: Secondary | ICD-10-CM

## 2017-08-08 DIAGNOSIS — J4540 Moderate persistent asthma, uncomplicated: Secondary | ICD-10-CM | POA: Diagnosis not present

## 2017-08-08 DIAGNOSIS — K219 Gastro-esophageal reflux disease without esophagitis: Secondary | ICD-10-CM | POA: Diagnosis not present

## 2017-08-08 DIAGNOSIS — T7800XD Anaphylactic reaction due to unspecified food, subsequent encounter: Secondary | ICD-10-CM | POA: Diagnosis not present

## 2017-08-08 DIAGNOSIS — J3089 Other allergic rhinitis: Secondary | ICD-10-CM

## 2017-08-08 MED ORDER — RANITIDINE HCL 150 MG PO TABS: ORAL_TABLET | ORAL | 5 refills | Status: DC

## 2017-08-08 MED ORDER — BUDESONIDE-FORMOTEROL FUMARATE 160-4.5 MCG/ACT IN AERO: INHALATION_SPRAY | RESPIRATORY_TRACT | 5 refills | Status: DC

## 2017-08-08 NOTE — Progress Notes (Deleted)
[place] [id number] Dept: [phone]  FOLLOW UP NOTE  Patient ID: Angela Jimenez, female    DOB: 23-Feb-1971  Age: 47 y.o. MRN: [id number] Date of Office Visit: 08/08/2017  Assessment  Chief Complaint: Allergic Reaction (friday night to almond milk(not her normal brand), coughing, couldnt breath good, nauseas, runny nose)  HPI Angela Jimenez is a 47 year old female who presents to the clinic for a sick visit. She was last seen in this clinic on 07/25/2017 by Dr. Lew Dawes for evaluation of cough, nasal congestion and postnasal drainage which required a course of prednisone and an antibiotic for resolution of symptoms.   On Friday night, Angela Jimenez reports she tried Silk almond milk for the first time while cooking and began to experience shortness of breath, cough, dizziness, lightheadedness and nausea. She took Benadryl and 10 mg of prednisone in an attempt to relieve her symptoms which was not successful. She reports getting ready to use her EpiPen, however, it had expired in 2016 and she also reports she was too nervous to self inject. She has been avoiding almond milk since Friday night. She does report sweats and chills over the weekend that have resolved now.   At today's visit, she reports she continues to feel terrible since Friday night with symptoms including sinus drainage, headache, nausea, increased heartburn, and shortness of breath and wheezing at rest and with activity. For her asthma, she is currently taking montelukast 10 mg once a day, using Flovent 110-2 puffs twice a day, and using both her Qvar and albuterol inhaler at least 3 times a day.  Allergic rhinitis is reported as moderately well controlled with cetirizine 10 mg once a day and Nasocort nasal spray as needed. She is not currently using any nasal saline rinses. She reports intermittent headache which is relieved with over the counter pain medicines.   Gastroesophageal reflux disease is reported as not  well controlled despite Protinix 40 mg once a day. She has tried to decrease her intake of caffeine over the last few months. She also reports an increase in emotional stress over the last several months.   Her current medications are listed in her chart.    Drug Allergies:  Allergies  Allergen Reactions  . Amoxicillin Anaphylaxis  . Other Other (See Comments) and Hives    Tree nuts, anaphylaxis and migraine headache  . Peanut-Containing Drug Products Anaphylaxis  . Penicillins Anaphylaxis  . Wheat Bran   . Prednisone Other (See Comments)    "Makes me numb and tingly" increases eye pressure.   . Corn-Containing Products   . Sulfa Antibiotics     Other reaction(s): UNSPECIFIED    Physical Exam: BP 118/90   Pulse 86   Temp 98.2 F (36.8 C) (Oral)   Resp 16   Ht 5' 2.1" (1.577 m)   Wt 142 lb 6.4 oz (64.6 kg)   SpO2 98%   BMI 25.96 kg/m    Physical Exam  Constitutional: She is oriented to person, place, and time. She appears well-developed and well-nourished.  HENT:  Head: Normocephalic and atraumatic.  Right Ear: External ear normal.  Left Ear: External ear normal.  Bilateral nares slightly erythematous and edematous with clear nasal drainage noted.  Eyes: Conjunctivae are normal.  Neck: Normal range of motion. Neck supple.  Cardiovascular: Normal rate, regular rhythm and normal heart sounds.  S1S2 normal. Regular rate and rhythm. No murmur noted.  Pulmonary/Chest: Effort normal and breath sounds normal.  Lungs clear  to auscultation  Musculoskeletal: Normal range of motion.  Neurological: She is alert and oriented to person, place, and time.  Skin: Skin is warm and dry.  Psychiatric: She has a normal mood and affect. Her behavior is normal. Judgment and thought content normal.    Diagnostics: FVC 2.69, FEV1 2.25.  Predicted FVC 2.74, predicted FEV1 2.22.  Spirometry reveals normal ventilatory function.  Assessment and Plan: 1. Moderate persistent asthma without  complication   2. Seasonal and perennial allergic rhinitis   3. Gastroesophageal reflux disease without esophagitis   4. Anaphylaxis due to food, subsequent encounter     Meds ordered this encounter  Medications  . ranitidine (ZANTAC) 150 MG tablet    Sig: One tablet twice a day for reflux    Dispense:  60 tablet    Refill:  5  . budesonide-formoterol (SYMBICORT) 160-4.5 MCG/ACT inhaler    Sig: Two puffs every 12 hours to prevent cough or wheeze. Rinse, gargle and spit after use.    Dispense:  10.2 g    Refill:  5    Patient Instructions  Zyrtec 10 mg-take 1 tablet once a day for runny nose or itchy eyes Nasacort 1 spray per nostril twice a day for stuffy nose Nasal saline irrigations at night before using Nasacort Montelukast  10 mg-take 1 tablet once a day for coughing or wheezing Stop Flovent 110. Begin Symbicort 160- 2 puffs twice a day with a spacer. Rinse and spit after each use. Robitussin-DM at night if needed for cough Add ranitidine 150 mg twice a day. Avoid all caffeine (soda, tea, and chocolate are examples) Call us if you're not doing well on this treatment plan Continue on your other medications  If you are concerned about a wheat  allergy, we could do an oral challenge in July or in the winter when you're not symptomatic from an allergy season  Avoid almond milk. If you have an allergic reaction, take Benadryl 50 mg every 4 hours, and if you have life-threatening symptoms, inject with EpiPen 0.3 mg.   Follow up in 1 month or sooner if needed   Return in about 1 month (around 09/08/2017), or if symptoms worsen or fail to improve.   Angela Jimenez was seen in the clinic with Dr. Shaune Leeks today.  Thank you for the opportunity to care for this patient.  Please do not hesitate to contact me with questions.  Gareth Morgan, FNP [facility] [place] Thank you for the opportunity to care for this patient.  Please do not  hesitate to contact me with questions.  Penne Lash, M.D.  [facility] [address] [place], [place] [address] [phone]

## 2017-08-08 NOTE — Patient Instructions (Addendum)
Zyrtec 10 mg-take 1 tablet once a day for runny nose or itchy eyes Nasacort 1 spray per nostril twice a day for stuffy nose Nasal saline irrigations at night before using Nasacort Montelukast  10 mg-take 1 tablet once a day for coughing or wheezing Stop Flovent 110. Begin Symbicort 160- 2 puffs twice a day with a spacer. Rinse and spit after each use. Robitussin-DM at night if needed for cough Add ranitidine 150 mg twice a day. Avoid all caffeine (soda, tea, and chocolate are examples) Call us if you're not doing well on this treatment plan Continue on your other medications  If you are concerned about a wheat  allergy, we could do an oral challenge in July or in the winter when you're not symptomatic from an allergy season  Avoid almond milk. If you have an allergic reaction, take Benadryl 50 mg every 4 hours, and if you have life-threatening symptoms, inject with EpiPen 0.3 mg.   Follow up in 1 month or sooner if needed

## 2017-08-09 ENCOUNTER — Telehealth: Payer: Self-pay | Admitting: Family Medicine

## 2017-08-09 NOTE — Telephone Encounter (Signed)
Patient reported she went to her PCP yesterday and found out she was anemic and had a Vitamin D deficiency. She wants her PCP to have access to her visits to this office.

## 2017-08-09 NOTE — Progress Notes (Addendum)
[place] [address] Dept: [phone]  FOLLOW UP NOTE  Patient ID: Angela Jimenez, female    DOB: 1971/02/12  Age: 47 y.o. MRN: [id number] Date of Office Visit: 08/08/2017  Assessment  Chief Complaint: Allergic Reaction (friday night to almond milk(not her normal brand), coughing, couldnt breath good, nauseas, runny nose)  HPI Angela Jimenez is a 47 year old female who presents to the clinic for a sick visit. She was last seen in this clinic on 07/25/2017 by Dr. Shaune Leeks for evaluation of cough, nasal congestion, and postnasal discharge which required a course of prednisone and an antibiotic for resolution of symptoms.   On Friday night, Angela Jimenez reports she tried Silk almond milk for the first time while cooking and began to experience shortness of breath, cough, dizziness, lightheadedness, and nausea. She took Benadryl and 10 mg of prednisone in an attempt to relieve her symptoms which was not successful. She reports getting ready to use her EpiPen, however, it had expired in 2016 and she also reports she was too nervous to self inject. She has been avoiding almond milk since Friday night . She does report sweats and chills over the weekend that have resolved now.  At today's visit, she reports she continues to feel terrible since Friday night with symptoms including sinus drainage, headache, nausea, increased heartburn, and shortness of breath and wheezing at rest and with activity. For her asthma, she is currently taking montelukast 10 mg once a day, using Flovent 110- 2 puffs twice a day, and using her Qvar and albuterol inhaler at least 3 times a day.  Allergic rhinitis is reports as moderately well controlled with cetirizine 10 mg once a day and Nasacort nasal spray as needed. She is not currently using any nasal saline rinses. She reports an intermittent headache wich is relieved with over the counter pain medicine.  Gastroesophageal reflux is reported as not well  controlled despite Protonix 40 mg once a day. She has tried to decrease her caffeine intake over the last few months. She also reports an increase in emotional stress over the last several months.   Her current medications are listed in the chart.   Drug Allergies:  Allergies  Allergen Reactions  . Amoxicillin Anaphylaxis  . Other Other (See Comments) and Hives    Tree nuts, anaphylaxis and migraine headache  . Peanut-Containing Drug Products Anaphylaxis  . Penicillins Anaphylaxis  . Wheat Bran   . Prednisone Other (See Comments)    "Makes me numb and tingly" increases eye pressure.   . Corn-Containing Products   . Sulfa Antibiotics     Other reaction(s): UNSPECIFIED    Physical Exam: BP 118/90   Pulse 86   Temp 98.2 F (36.8 C) (Oral)   Resp 16   Ht 5' 2.1" (1.577 m)   Wt 142 lb 6.4 oz (64.6 kg)   SpO2 98%   BMI 25.96 kg/m    Physical Exam  Constitutional: She is oriented to person, place, and time. She appears well-developed and well-nourished.  HENT:  Head: Normocephalic and atraumatic.  Right Ear: External ear normal.  Left Ear: External ear normal.  Bilateral nares slightly erythematous and edematous with cleat nasal drainage noted.  Eyes: Conjunctivae are normal.  Neck: Normal range of motion. Neck supple.  Cardiovascular: Normal rate, regular rhythm and normal heart sounds.  S1S2 notmal. Regular rate and rhythm. No murmur noted.  Pulmonary/Chest: Effort normal and breath sounds normal.  Lungs clear to auscultation  Musculoskeletal: Normal range of motion.  Neurological: She is alert and oriented to person, place, and time.  Skin: Skin is warm and dry.  Psychiatric: She has a normal mood and affect. Her behavior is normal. Judgment and thought content normal.    Diagnostics: FVC 2.69. FEV1 2.25. Predicted FVC 2.74, predicted FEV1 2.22. Spirometry reveals normal ventilatory function.    Assessment and Plan: 1. Moderate persistent asthma without  complication   2. Seasonal and perennial allergic rhinitis   3. Gastroesophageal reflux disease without esophagitis   4. Anaphylaxis due to food, subsequent encounter     Meds ordered this encounter  Medications  . ranitidine (ZANTAC) 150 MG tablet    Sig: One tablet twice a day for reflux    Dispense:  60 tablet    Refill:  5  . budesonide-formoterol (SYMBICORT) 160-4.5 MCG/ACT inhaler    Sig: Two puffs every 12 hours to prevent cough or wheeze. Rinse, gargle and spit after use.    Dispense:  10.2 g    Refill:  5    Patient Instructions  Zyrtec 10 mg-take 1 tablet once a day for runny nose or itchy eyes Nasacort 1 spray per nostril twice a day for stuffy nose Nasal saline irrigations at night before using Nasacort Montelukast  10 mg-take 1 tablet once a day for coughing or wheezing Stop Flovent 110. Begin Symbicort 160- 2 puffs twice a day with a spacer. Rinse and spit after each use. Robitussin-DM at night if needed for cough Add ranitidine 150 mg twice a day. Avoid all caffeine (soda, tea, and chocolate are examples) Call us if you're not doing well on this treatment plan Continue on your other medications  If you are concerned about a wheat  allergy, we could do an oral challenge in July or in the winter when you're not symptomatic from an allergy season  Avoid almond milk. If you have an allergic reaction, take Benadryl 50 mg every 4 hours, and if you have life-threatening symptoms, inject with EpiPen 0.3 mg.   Follow up in 1 month or sooner if needed   Return in about 1 month (around 09/08/2017), or if symptoms worsen or fail to improve.   Toshiye Kever was seen in the clinic with Dr. Shaune Leeks today. Thank you for the opportunity to care for this patient.  Please do not hesitate to contact me with questions.  Gareth Morgan, FNP [facility] [place]  I have provided oversight concerning Gareth Morgan' evaluation and treatment  of this patient's health issues addressed during today's encounter. I agree with the assessment and therapeutic plan as outlined in the note.   Thank you for the opportunity to care for this patient.  Please do not hesitate to contact me with questions.  Penne Lash, M.D.  [facility] [address] [place], [place] [address] [phone]

## 2017-08-09 NOTE — Telephone Encounter (Signed)
She would like to talk with you regarding her appt yesterday and would like the encounter notes sent to her.

## 2017-08-16 ENCOUNTER — Other Ambulatory Visit: Payer: Self-pay | Admitting: Pediatrics

## 2017-08-17 ENCOUNTER — Telehealth: Payer: Self-pay

## 2017-08-17 ENCOUNTER — Telehealth: Payer: Self-pay | Admitting: Family Medicine

## 2017-08-17 MED ORDER — PREDNISONE 10 MG PO TABS: 10.0000 mg | ORAL_TABLET | Freq: Every day | ORAL | 0 refills | Status: AC

## 2017-08-17 NOTE — Telephone Encounter (Signed)
Patient reporting shortness of breath that started at work yesterday while a work crew was [employer].  I am sending in 10 mg of prednisone for 5 days. She will come into the clinic on Monday if she is worse or has not improved

## 2017-08-17 NOTE — Telephone Encounter (Signed)
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 She did find out she is anemic and vitamin D deficient. She stated she did have a reaction the other day after smelling a perfume. She would like for you to call her back on what she can do moving forward to get better.  [phone]

## 2017-08-17 NOTE — Telephone Encounter (Signed)
Can you please get her last visit and labs from her PCP? 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 Thank you

## 2017-08-17 NOTE — Telephone Encounter (Signed)
Records requested. To be faxed.

## 2017-08-22 ENCOUNTER — Encounter: Payer: Self-pay | Admitting: Hematology & Oncology

## 2017-09-05 ENCOUNTER — Ambulatory Visit: Payer: PPO | Admitting: Family Medicine

## 2017-09-06 ENCOUNTER — Other Ambulatory Visit: Payer: Self-pay | Admitting: Family

## 2017-09-06 DIAGNOSIS — D649 Anemia, unspecified: Secondary | ICD-10-CM

## 2017-09-07 ENCOUNTER — Inpatient Hospital Stay: Payer: PPO

## 2017-09-07 ENCOUNTER — Other Ambulatory Visit: Payer: Self-pay

## 2017-09-07 ENCOUNTER — Inpatient Hospital Stay: Payer: PPO | Attending: Family | Admitting: Family

## 2017-09-07 DIAGNOSIS — D500 Iron deficiency anemia secondary to blood loss (chronic): Secondary | ICD-10-CM | POA: Diagnosis not present

## 2017-09-07 DIAGNOSIS — D508 Other iron deficiency anemias: Secondary | ICD-10-CM | POA: Insufficient documentation

## 2017-09-07 DIAGNOSIS — K909 Intestinal malabsorption, unspecified: Secondary | ICD-10-CM | POA: Insufficient documentation

## 2017-09-07 DIAGNOSIS — N921 Excessive and frequent menstruation with irregular cycle: Secondary | ICD-10-CM

## 2017-09-07 DIAGNOSIS — N920 Excessive and frequent menstruation with regular cycle: Secondary | ICD-10-CM | POA: Diagnosis not present

## 2017-09-07 DIAGNOSIS — D649 Anemia, unspecified: Secondary | ICD-10-CM

## 2017-09-07 LAB — IRON AND TIBC
Iron: 84 ug/dL (ref 41–142)
Saturation Ratios: 21 % (ref 21–57)
TIBC: 405 ug/dL (ref 236–444)
UIBC: 321 ug/dL

## 2017-09-07 LAB — CBC WITH DIFFERENTIAL (CANCER CENTER ONLY)
Basophils Absolute: 0 10*3/uL (ref 0.0–0.1)
Basophils Relative: 0 %
Eosinophils Absolute: 0.1 10*3/uL (ref 0.0–0.5)
Eosinophils Relative: 1 %
HCT: 40.5 % (ref 34.8–46.6)
Hemoglobin: 13.4 g/dL (ref 11.6–15.9)
Lymphocytes Relative: 39 %
Lymphs Abs: 2.2 10*3/uL (ref 0.9–3.3)
MCH: 28.8 pg (ref 26.0–34.0)
MCHC: 33.1 g/dL (ref 32.0–36.0)
MCV: 86.9 fL (ref 81.0–101.0)
Monocytes Absolute: 0.3 10*3/uL (ref 0.1–0.9)
Monocytes Relative: 5 %
Neutro Abs: 3.1 10*3/uL (ref 1.5–6.5)
Neutrophils Relative %: 55 %
Platelet Count: 280 10*3/uL (ref 145–400)
RBC: 4.66 MIL/uL (ref 3.70–5.32)
RDW: 17.9 % — ABNORMAL HIGH (ref 11.1–15.7)
WBC Count: 5.6 10*3/uL (ref 3.9–10.0)

## 2017-09-07 LAB — CMP (CANCER CENTER ONLY)
ALT: 11 U/L (ref 0–55)
AST: 12 U/L (ref 5–34)
Albumin: 4.1 g/dL (ref 3.5–5.0)
Alkaline Phosphatase: 52 U/L (ref 40–150)
Anion gap: 7 (ref 3–11)
BUN: 5 mg/dL — ABNORMAL LOW (ref 7–26)
CO2: 27 mmol/L (ref 22–29)
Calcium: 9.6 mg/dL (ref 8.4–10.4)
Chloride: 103 mmol/L (ref 98–109)
Creatinine: 0.98 mg/dL (ref 0.60–1.10)
GFR, Est AFR Am: >60 mL/min (ref >60)
GFR, Estimated: >60 mL/min (ref >60)
Glucose, Bld: 86 mg/dL (ref 70–140)
Potassium: 3.8 mmol/L (ref 3.5–5.1)
Sodium: 137 mmol/L (ref 136–145)
Total Bilirubin: 0.4 mg/dL (ref 0.2–1.2)
Total Protein: 7.4 g/dL (ref 6.4–8.3)

## 2017-09-07 LAB — LACTATE DEHYDROGENASE: LDH: 166 U/L (ref 125–245)

## 2017-09-07 LAB — SAVE SMEAR

## 2017-09-07 LAB — FERRITIN: Ferritin: 14 ng/mL (ref 9–269)

## 2017-09-07 LAB — RETICULOCYTES
RBC.: 4.6 MIL/uL (ref 3.70–5.45)
Retic Count, Absolute: 41.4 10*3/uL (ref 33.7–90.7)
Retic Ct Pct: 0.9 % (ref 0.7–2.1)

## 2017-09-07 NOTE — Progress Notes (Signed)
Hematology/Oncology Consultation   Name: Angela Jimenez      MRN: [id number]    Location: Room/bed info not found  Date: 09/07/2017 Time:9:12 AM   REFERRING PHYSICIAN: Thao Le, DO  REASON FOR CONSULT: Iron deficiency anemia    DIAGNOSIS:  Iron deficiency anemia secondary to malabsorption and heavy cycles  HISTORY OF PRESENT ILLNESS: [name] is a very pleasant 47 yo African American female with iron deficiency anemia secondary to heavy cycles and malabsorption. She is taking an oral iron supplement TID but has had increased reflux and abdominal cramping with constipation. Her hgb is now up to 13.4.  Last month er ferritin was 3 and iron saturation was 5%.  She is symptomatic with SOB with exertion, fatigue, weakness, lightheadedness, brain fog, inability to focus and feeling off balance. No falls or syncopal episodes.   She also has vitamin D deficiency and is taking Replesta once a week.  Her cycles are heavy but regular. No other bleeding, no bruising or petechiae.  She has GERD and has stopped taking her protonix and is now taking zantac and a few natural supplements to help with this.  He daughter, sister, mother and maternal grandmother all have history of anemia.   She had a grandmother with the sickle cell trait. No personal history of Lago trait or disease.  She had a uterine embolization in January 2007, uterine fibroid removed in March 2007 and [facility] in May 2007.  She had placenta previa with her daughter followed by C-section. She has no history of miscarriage.  She has had her wisdom teeth out as we as an inferior discectomy with fusion of the C4 and C5 vertebrae with no complications. She has some numbness and tingling in her hands that comes and goes due to her neck issues.  No personal history of cancer. Family history includes: maternal aunt - thyroid and maternal grandmother - colon.  She states that she is up to date on her mammogram and it was negative.  No fever, chills,  n/v, cough, rash, chest pain, palpitations or changes in bladder habits.  No swelling or tenderness in her extremities at this time.  She has a good appetite and is staying well hydrated. Her weight is stable.  She is not a smoker and only occasionally has an alcoholic beverage socially.  She works part time in [place] at [employer]. She is on disability due to her neck and lower back issues. 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    ROS: All other 10 point review of systems is negative.   PAST MEDICAL HISTORY:   Past Medical History:  Diagnosis Date  . Asthma   . Bulging lumbar disc    between c3 and c4     ALLERGIES: Allergies  Allergen Reactions  . Amoxicillin Anaphylaxis  . Other Other (See Comments) and Hives    Tree nuts, anaphylaxis and migraine headache  . Peanut-Containing Drug Products Anaphylaxis  . Penicillins Anaphylaxis  . Wheat Bran   . Prednisone Other (See Comments)    "Makes me numb and tingly" increases eye pressure.   . Corn-Containing Products   . Sulfa Antibiotics     Other reaction(s): UNSPECIFIED      MEDICATIONS:  Current Outpatient Medications on File Prior to Visit  Medication Sig Dispense Refill  . albuterol (PROAIR HFA) 108 (90 Base) MCG/ACT inhaler Inhale 2 puffs into the lungs every 4 (four) hours as needed for wheezing or shortness of  breath. 1 Inhaler 3  . beclomethasone (QVAR REDIHALER) 80 MCG/ACT inhaler PREPARE STUDY-QVAR IN ADDITION to daily maintenance meds. Take QVAR 1 puff for EACH puff of beta-agonist or 5 puffs with each nebulizer use    . benzonatate (TESSALON PERLES) 100 MG capsule Take 1 capsule (100 mg total) by mouth 3 (three) times daily as needed for cough. (Patient not taking: Reported on 08/08/2017) 30 capsule 0  . budesonide-formoterol (SYMBICORT) 160-4.5 MCG/ACT inhaler Two puffs every 12 hours to prevent cough or wheeze. Rinse, gargle and spit after use. 10.2 g 5  . Carbinoxamine Maleate 4 MG TABS Take 2 tablets  (8 mg total) by mouth 3 (three) times daily before meals for 10 days. 28 each 0  . Carbinoxamine Maleate 6 MG TABS Take by mouth.    . cetirizine (ZYRTEC) 10 MG tablet Take 10 mg by mouth daily.    . clarithromycin (BIAXIN) 250 MG tablet Take 1 tablet every 12 hours for 10 days 20 tablet 0  . cyclobenzaprine (FLEXERIL) 10 MG tablet Take 10 mg by mouth as needed for muscle spasms.    Marland Kitchen EPINEPHrine (EPIPEN 2-PAK) 0.3 mg/0.3 mL IJ SOAJ injection Use as directed for severe allergic reactions 2 Device 2  . fluticasone (FLOVENT HFA) 110 MCG/ACT inhaler Inhale 2 puffs into the lungs 2 (two) times daily. 1 Inhaler 5  . ibuprofen (ADVIL,MOTRIN) 800 MG tablet Take 800 mg by mouth as needed.    Marland Kitchen levocetirizine (XYZAL) 5 MG tablet TAKE 1 TABLET BY MOUTH EVERY DAY IN THE EVENING 30 tablet 5  . metaxalone (SKELAXIN) 800 MG tablet TAKE 1 TABLET BY MOUTH EVERY 8 HOURS AS NEEDED FOR SPASMS  0  . montelukast (SINGULAIR) 10 MG tablet Take 1 tablet (10 mg total) by mouth at bedtime. 90 tablet 3  . pantoprazole (PROTONIX) 40 MG tablet TAKE 1 TABLET BY MOUTH ONCE A DAY FOR GERD  3  . PATADAY 0.2 % SOLN ONE DROP EACH EYE ONCE A DAY FOR ITCHY EYES 2.5 mL 5  . predniSONE (DELTASONE) 10 MG tablet 10mg  tablet twice a day for 4 days and then 10mg  tablet on day 5 then stop. 9 tablet 0  . ranitidine (ZANTAC) 150 MG tablet One tablet twice a day for reflux 60 tablet 5  . traMADol (ULTRAM) 50 MG tablet Take by mouth as needed.     No current facility-administered medications on file prior to visit.      PAST SURGICAL HISTORY No past surgical history on file.  FAMILY HISTORY: Family History  Problem Relation Age of Onset  . Allergic rhinitis Neg Hx   . Angioedema Neg Hx   . Asthma Neg Hx   . Eczema Neg Hx   . Immunodeficiency Neg Hx   . Urticaria Neg Hx     SOCIAL HISTORY:  reports that she has never smoked. She has never used smokeless tobacco. She reports that she does not drink alcohol or use  drugs.  PERFORMANCE STATUS: The patient's performance status is 1 - Symptomatic but completely ambulatory  PHYSICAL EXAM: Most Recent Vital Signs: There were no vitals taken for this visit. There were no vitals taken for this visit.  General Appearance:    Alert, cooperative, no distress, appears stated age  Head:    Normocephalic, without obvious abnormality, atraumatic  Eyes:    PERRL, conjunctiva/corneas clear, EOM's intact, fundi    benign, both eyes        Throat:   Lips, mucosa, and tongue normal;  teeth and gums normal  Neck:   Supple, symmetrical, trachea midline, no adenopathy;    thyroid:  no enlargement/tenderness/nodules; no carotid   bruit or JVD  Back:     Symmetric, no curvature, ROM normal, no CVA tenderness  Lungs:     Clear to auscultation bilaterally, respirations unlabored  Chest Wall:    No tenderness or deformity   Heart:    Regular rate and rhythm, S1 and S2 normal, no murmur, rub   or gallop     Abdomen:     Soft, non-tender, bowel sounds active all four quadrants,    no masses, no organomegaly        Extremities:   Extremities normal, atraumatic, no cyanosis or edema  Pulses:   2+ and symmetric all extremities  Skin:   Skin color, texture, turgor normal, no rashes or lesions  Lymph nodes:   Cervical, supraclavicular, and axillary nodes normal  Neurologic:   CNII-XII intact, normal strength, sensation and reflexes    throughout    LABORATORY DATA:  No results found for this or any previous visit (from the past 48 hour(s)).    RADIOGRAPHY: No results found.     PATHOLOGY: None  ASSESSMENT/PLAN: Ms. Dutko is a very pleasant 47 yo African American female with iron deficiency anemia secondary to heavy cycles and malabsorption. She is quite symptomatic as mentioned above.  Her iron studies last month were quite low. She is on an oral iron supplement now but is having GI symptoms.  We will see what her iron studies show and bring her back in for  infusion if needed.  We discussed the potential cost and I gave her our standard letter explaining cost of feraheme prior to insurance. She verbalized understanding and will meet with Baxter Flattery is infusion is needed.  We will contact her to schedule follow-up once all lab reports are available.   All questions were answered and she is in agreement with the plan. She will contact our office with any questions or concerns. We can certainly see the patient much sooner if necessary.  The patient was discussed with and also seen by Dr. Marin Olp and he is in agreement with the aforementioned.   Laverna Peace     Addendum: I saw and examined the patient with Judson Roch.  I agree with the above assessment.  I looked at her blood smear under the microscope.  I do not see any evidence of a hematologic malignancy.  She had normochromic red blood cells.  Her white blood cells showed good maturity.  I saw no inclusion bodies.  There were no target cells.  Her iron studies showed a ferritin of 14 with an iron saturation of 21%.  She is having symptoms taking oral iron.  Given this, we will see about giving her IV iron.  I think this will help.  We spent about 40 minutes with her.  Over half the time was spent face-to-face with her.  We reviewed her lab work.  We answered all of her questions.  She is very nice.  We will plan to get her back to see [place] in another 4-6 weeks.  Lattie Haw, MD

## 2017-09-08 ENCOUNTER — Encounter: Payer: Self-pay | Admitting: Family

## 2017-09-08 DIAGNOSIS — K909 Intestinal malabsorption, unspecified: Secondary | ICD-10-CM

## 2017-09-08 DIAGNOSIS — N921 Excessive and frequent menstruation with irregular cycle: Secondary | ICD-10-CM | POA: Insufficient documentation

## 2017-09-08 HISTORY — DX: Excessive and frequent menstruation with irregular cycle: N92.1

## 2017-09-08 HISTORY — DX: Intestinal malabsorption, unspecified: K90.9

## 2017-09-08 LAB — ERYTHROPOIETIN: Erythropoietin: 8.9 m[IU]/mL (ref 2.6–18.5)

## 2017-09-11 ENCOUNTER — Telehealth: Payer: Self-pay | Admitting: Family

## 2017-09-11 NOTE — Telephone Encounter (Signed)
Spoke with Ms. Kaman and went over her Iron studies. She will continue her oral iron and see if she can tolerate it. Her counts are improving. If she gets to the point where she can not tolerate the supplement she will stop and we will follow along and replaced with IV iron if needed. All questions were answered and she is in agreement with the plan.

## 2017-09-18 ENCOUNTER — Encounter: Payer: Self-pay | Admitting: Pediatrics

## 2017-09-18 ENCOUNTER — Ambulatory Visit (INDEPENDENT_AMBULATORY_CARE_PROVIDER_SITE_OTHER): Payer: PPO | Admitting: Pediatrics

## 2017-09-18 VITALS — BP 120/84 | HR 84 | Temp 98.1°F | Resp 16

## 2017-09-18 DIAGNOSIS — K219 Gastro-esophageal reflux disease without esophagitis: Secondary | ICD-10-CM | POA: Diagnosis not present

## 2017-09-18 DIAGNOSIS — J3089 Other allergic rhinitis: Secondary | ICD-10-CM | POA: Insufficient documentation

## 2017-09-18 DIAGNOSIS — J302 Other seasonal allergic rhinitis: Secondary | ICD-10-CM

## 2017-09-18 DIAGNOSIS — J4540 Moderate persistent asthma, uncomplicated: Secondary | ICD-10-CM | POA: Diagnosis not present

## 2017-09-18 DIAGNOSIS — T7800XD Anaphylactic reaction due to unspecified food, subsequent encounter: Secondary | ICD-10-CM | POA: Diagnosis not present

## 2017-09-18 NOTE — Progress Notes (Signed)
[place] [address] Dept: [phone]  FOLLOW UP NOTE  Patient ID: Angela Jimenez, female    DOB: [birth year]  Age: 47 y.o. MRN: [id number] Date of Office Visit: 09/18/2017  Assessment  Chief Complaint: Allergic Rhinitis ; Asthma; and Shortness of Breath  HPI Angela Jimenez is a 47 year old female who presents to the clinic for a follow up visit. She was last seen in this clinic on 08/08/2017 by Ambs NP for evaluation of asthma, allergic rhinitis, refulx, and food allergy. At that time, she was continued on her medications and ranitidine was added for reflux control.  In the interim, she has had routine labs drawn which indicated an iron deficiency anemia. She is currently being managed by hemotology/oncology specialist Dr. Lattie Haw at [facility].   At today's visit, she reports her asthma is much improved since her last visit. She reports shortness of breath only with activity such as climbing up stairs, no wheezing and no coughing. She is currently using Symbicort 160- 2 puffs twice a day with a spacer, montelukast 10 mg once a day, and rescue inhalers Qvar 80 and Ventolin concurrently 1-2 times a day.   Allergic rhinitis is reported as well controlled with Xyzal 5 mg once a day in the morning and Zyrtec 10 mg once a day in the evening.   She currently avoids all nuts, soy, and corn products due to gastric sensitivity. She avoids wheat and gluten due to tongue tingling. She has not had any accidental ingestions nor has she needed to use her EpiPen since her last visit to this office.  Reflux is currently reported as moderately well controlled with ranitidine 150 twice a day. She reports foods that increase her reflux include soy and spaghetti.   Her current medications are listed in the chart.   Drug Allergies:  Allergies  Allergen Reactions  . Amoxicillin Anaphylaxis  . Other Other (See Comments) and Hives    Tree nuts, anaphylaxis and  migraine headache  . Peanut-Containing Drug Products Anaphylaxis  . Penicillins Anaphylaxis  . Wheat Bran   . Prednisone Other (See Comments)    "Makes me numb and tingly" increases eye pressure.   . Corn-Containing Products   . Sulfa Antibiotics     Other reaction(s): UNSPECIFIED    Physical Exam: BP 120/84 (BP Location: Left Arm, Patient Position: Sitting, Cuff Size: Normal)   Pulse 84   Temp 98.1 F (36.7 C) (Oral)   Resp 16   SpO2 98%    Physical Exam  Constitutional: She is oriented to person, place, and time. She appears well-developed and well-nourished.  HENT:  Head: Normocephalic.  Right Ear: External ear normal.  Left Ear: External ear normal.  Nose: Nose normal.  Mouth/Throat: Oropharynx is clear and moist.  Eyes: Conjunctivae are normal.  Neck: Normal range of motion. Neck supple.  Cardiovascular: Normal rate, regular rhythm and normal heart sounds.  No murmur noted.  Pulmonary/Chest: Effort normal and breath sounds normal.  Lungs clear to auscultation  Musculoskeletal: Normal range of motion.  Neurological: She is alert and oriented to person, place, and time.  Skin: Skin is warm and dry.  Psychiatric: She has a normal mood and affect. Her behavior is normal. Judgment and thought content normal.    Diagnostics: FVC 2.84, FEV1 2.32. Predicted FVC 2.74, predicted FEV1 2.22. Spirometry is within the normal range.   Assessment and Plan: 1. Moderate persistent asthma without complication   2. Seasonal  and perennial allergic rhinitis   3. Anaphylactic shock due to food, subsequent encounter   4. Gastroesophageal reflux disease without esophagitis     Patient Instructions  Continue Zyrtec 10 mg-take 1 tablet once a day for runny nose or itchy eyes Continue Nasacort 1 spray per nostril twice a day for stuffy nose Nasal saline irrigations at night before using Nasacort Montelukast  10 mg-take 1 tablet once a day to prevent coughing or wheezing Continue  Symbicort 160- 2 puffs twice a day with a spacer. Rinse and spit after each use. Continue ranitidine 150 mg twice a day. Avoid all caffeine (soda, tea, and chocolate are examples) Call us if you're not doing well on this treatment plan Continue on your other medications  If you are concerned about a wheat  allergy, we could do an oral challenge in July or in the winter when you're not symptomatic from an allergy season  Avoid almond milk. If you have an allergic reaction, take Benadryl 50 mg every 4 hours, and if you have life-threatening symptoms, inject with EpiPen 0.3 mg.   Follow up in 6 months or sooner if needed   Return in about 6 months (around 03/20/2018), or if symptoms worsen or fail to improve.   Thank you for the opportunity to care for this patient.  Please do not hesitate to contact me with questions.  Gareth Morgan, FNP [facility]  I have provided oversight concerning Gareth Morgan' evaluation and treatment of this patient's health issues addressed during today's encounter. I agree with the assessment and therapeutic plan as outlined in the note.   Thank you for the opportunity to care for this patient.  Please do not hesitate to contact me with questions.  Penne Lash, M.D.  [facility] [address] [place], [place] [address] [phone]

## 2017-09-18 NOTE — Patient Instructions (Signed)
Continue Zyrtec 10 mg-take 1 tablet once a day for runny nose or itchy eyes Continue Nasacort 1 spray per nostril twice a day for stuffy nose Nasal saline irrigations at night before using Nasacort Montelukast  10 mg-take 1 tablet once a day to prevent coughing or wheezing Continue Symbicort 160- 2 puffs twice a day with a spacer. Rinse and spit after each use. Continue ranitidine 150 mg twice a day. Avoid all caffeine (soda, tea, and chocolate are examples) Call us if you're not doing well on this treatment plan Continue on your other medications  If you are concerned about a wheat  allergy, we could do an oral challenge in July or in the winter when you're not symptomatic from an allergy season  Avoid almond milk. If you have an allergic reaction, take Benadryl 50 mg every 4 hours, and if you have life-threatening symptoms, inject with EpiPen 0.3 mg.   Follow up in 6 months or sooner if needed

## 2017-09-25 ENCOUNTER — Ambulatory Visit: Payer: PPO | Admitting: Pediatrics

## 2017-10-17 ENCOUNTER — Other Ambulatory Visit: Payer: Self-pay

## 2017-10-17 ENCOUNTER — Other Ambulatory Visit: Payer: Self-pay | Admitting: Allergy

## 2017-10-17 MED ORDER — MONTELUKAST SODIUM 10 MG PO TABS: 10.0000 mg | ORAL_TABLET | Freq: Every day | ORAL | 0 refills | Status: DC

## 2017-10-20 DIAGNOSIS — R300 Dysuria: Secondary | ICD-10-CM | POA: Diagnosis not present

## 2017-10-20 DIAGNOSIS — F419 Anxiety disorder, unspecified: Secondary | ICD-10-CM | POA: Diagnosis not present

## 2017-10-20 DIAGNOSIS — F439 Reaction to severe stress, unspecified: Secondary | ICD-10-CM | POA: Diagnosis not present

## 2017-10-23 ENCOUNTER — Encounter: Payer: Self-pay | Admitting: Family

## 2017-10-23 ENCOUNTER — Inpatient Hospital Stay: Payer: PPO

## 2017-10-23 ENCOUNTER — Other Ambulatory Visit: Payer: Self-pay

## 2017-10-23 ENCOUNTER — Inpatient Hospital Stay: Payer: PPO | Attending: Family | Admitting: Family

## 2017-10-23 VITALS — BP 125/79 | HR 81 | Temp 97.6°F | Resp 18 | Wt 137.0 lb

## 2017-10-23 DIAGNOSIS — D500 Iron deficiency anemia secondary to blood loss (chronic): Secondary | ICD-10-CM | POA: Diagnosis not present

## 2017-10-23 DIAGNOSIS — K909 Intestinal malabsorption, unspecified: Secondary | ICD-10-CM | POA: Diagnosis not present

## 2017-10-23 DIAGNOSIS — N921 Excessive and frequent menstruation with irregular cycle: Secondary | ICD-10-CM

## 2017-10-23 DIAGNOSIS — N920 Excessive and frequent menstruation with regular cycle: Secondary | ICD-10-CM | POA: Insufficient documentation

## 2017-10-23 DIAGNOSIS — D508 Other iron deficiency anemias: Secondary | ICD-10-CM

## 2017-10-23 LAB — CBC WITH DIFFERENTIAL (CANCER CENTER ONLY)
Basophils Absolute: 0 10*3/uL (ref 0.0–0.1)
Basophils Relative: 0 %
Eosinophils Absolute: 0.1 10*3/uL (ref 0.0–0.5)
Eosinophils Relative: 2 %
HCT: 37.9 % (ref 34.8–46.6)
Hemoglobin: 12.6 g/dL (ref 11.6–15.9)
Lymphocytes Relative: 24 %
Lymphs Abs: 1.4 10*3/uL (ref 0.9–3.3)
MCH: 30 pg (ref 26.0–34.0)
MCHC: 33.2 g/dL (ref 32.0–36.0)
MCV: 90.2 fL (ref 81.0–101.0)
Monocytes Absolute: 0.4 10*3/uL (ref 0.1–0.9)
Monocytes Relative: 7 %
Neutro Abs: 3.8 10*3/uL (ref 1.5–6.5)
Neutrophils Relative %: 67 %
Platelet Count: 263 10*3/uL (ref 145–400)
RBC: 4.2 MIL/uL (ref 3.70–5.32)
RDW: 16.8 % — ABNORMAL HIGH (ref 11.1–15.7)
WBC Count: 5.7 10*3/uL (ref 3.9–10.0)

## 2017-10-23 LAB — RETICULOCYTES
RBC.: 4.16 MIL/uL (ref 3.70–5.45)
Retic Count, Absolute: 45.8 10*3/uL (ref 33.7–90.7)
Retic Ct Pct: 1.1 % (ref 0.7–2.1)

## 2017-10-23 NOTE — Progress Notes (Signed)
Hematology and Oncology Follow Up Visit  Angela Jimenez [id number] 25-Jan-1971 47 y.o. 10/23/2017   Principle Diagnosis:  Iron deficiency anemia secondary to malabsorption and heavy cycles  Current Therapy:   Oral iron supplement   Interim History:  Angela Jimenez is here today for follow-up. She is taking her iron supplement but decreased it to once a day and sten stopped last week while she takes Cipro for a UTI. She has had some lower abdominal discomfort off and on with this.  She still has some episodes of fatigue, palpitations and SOB with exertion such as stairs.  She is still having heavy cycles and has history of fibroids. She has an appointment with her gynecologist on June 11th.  No other bleeding. She does bruise easily at this time.  No fever, chills, n/v, cough, rash, dizziness, chest pain or changes in bowel or bladder habits.  No swelling or tenderness in her extremities. She has numbness and tingling off and on in her hands.  She has started taking vitamin B 12 daily and feels that this has helped.  No lymphadenopathy noted on exam.  She is staying active doing 40 minutes of cardio at the gym 2-3 days a week.  She has maintained a god appetite and is staying well hydrated. His weight is stable.   ECOG Performance Status: 1 - Symptomatic but completely ambulatory  Medications:  Allergies as of 10/23/2017      Reactions   Amoxicillin Anaphylaxis   Other Other (See Comments), Hives   Tree nuts, anaphylaxis and migraine headache   Peanut-containing Drug Products Anaphylaxis   Penicillins Anaphylaxis   Wheat Bran    Prednisone Other (See Comments)   "Makes me numb and tingly" increases eye pressure.    Corn-containing Products    Sulfa Antibiotics    Other reaction(s): UNSPECIFIED      Medication List        Accurate as of 10/23/17 11:56 AM. Always use your most recent med list.          budesonide-formoterol 160-4.5 MCG/ACT inhaler Commonly known as:   SYMBICORT Two puffs every 12 hours to prevent cough or wheeze. Rinse, gargle and spit after use.   cetirizine 10 MG tablet Commonly known as:  ZYRTEC Take 10 mg by mouth daily. Takes 1 tablet at night.   cyclobenzaprine 10 MG tablet Commonly known as:  FLEXERIL Take 10 mg by mouth as needed for muscle spasms.   EPINEPHrine 0.3 mg/0.3 mL Soaj injection Commonly known as:  EPIPEN 2-PAK Use as directed for severe allergic reactions   ibuprofen 800 MG tablet Commonly known as:  ADVIL,MOTRIN Take 800 mg by mouth as needed.   IRON PO Take by mouth.   levocetirizine 5 MG tablet Commonly known as:  XYZAL TAKE 1 TABLET BY MOUTH EVERY DAY IN THE EVENING   metaxalone 800 MG tablet Commonly known as:  SKELAXIN PRN   montelukast 10 MG tablet Commonly known as:  SINGULAIR Take 1 tablet (10 mg total) by mouth at bedtime.   naproxen 500 MG tablet Commonly known as:  NAPROSYN Take by mouth as needed.   PATADAY 0.2 % Soln Generic drug:  Olopatadine HCl ONE DROP EACH EYE ONCE A DAY FOR ITCHY EYES   QVAR REDIHALER 80 MCG/ACT inhaler Generic drug:  beclomethasone PREPARE STUDY-QVAR IN ADDITION to daily maintenance meds. Take QVAR 1 puff for EACH puff of beta-agonist or 5 puffs with each nebulizer use   ranitidine 150 MG tablet Commonly known  as:  ZANTAC One tablet twice a day for reflux   traMADol 50 MG tablet Commonly known as:  ULTRAM Take by mouth as needed.   Vitamin D (Ergocalciferol) 50000 units Caps capsule Commonly known as:  DRISDOL Take 50,000 Units by mouth every 7 (seven) days.       Allergies:  Allergies  Allergen Reactions  . Amoxicillin Anaphylaxis  . Other Other (See Comments) and Hives    Tree nuts, anaphylaxis and migraine headache  . Peanut-Containing Drug Products Anaphylaxis  . Penicillins Anaphylaxis  . Wheat Bran   . Prednisone Other (See Comments)    "Makes me numb and tingly" increases eye pressure.   . Corn-Containing Products   . Sulfa  Antibiotics     Other reaction(s): UNSPECIFIED    Past Medical History, Surgical history, Social history, and Family History were reviewed and updated.  Review of Systems: All other 10 point review of systems is negative.   Physical Exam:  vitals were not taken for this visit.   Wt Readings from Last 3 Encounters:  08/08/17 142 lb 6.4 oz (64.6 kg)  06/26/17 151 lb (68.5 kg)  06/13/17 151 lb (68.5 kg)    Ocular: Sclerae unicteric, pupils equal, round and reactive to light Ear-nose-throat: Oropharynx clear, dentition fair Lymphatic: No cervical, supraclavicular or axillary adenopathy Lungs no rales or rhonchi, good excursion bilaterally Heart regular rate and rhythm, no murmur appreciated Abd soft, nontender, positive bowel sounds, no liver or spleen tip palpated on exam, no fluid wave  MSK no focal spinal tenderness, no joint edema Neuro: non-focal, well-oriented, appropriate affect Breasts: Deferred   Lab Results  Component Value Date   WBC 5.7 10/23/2017   HGB 12.6 10/23/2017   HCT 37.9 10/23/2017   MCV 90.2 10/23/2017   PLT 263 10/23/2017   Lab Results  Component Value Date   FERRITIN 14 09/07/2017   IRON 84 09/07/2017   TIBC 405 09/07/2017   UIBC 321 09/07/2017   IRONPCTSAT 21 09/07/2017   Lab Results  Component Value Date   RETICCTPCT 0.9 09/07/2017   RBC 4.20 10/23/2017   No results found for: KPAFRELGTCHN, LAMBDASER, KAPLAMBRATIO No results found for: IGGSERUM, IGA, IGMSERUM No results found for: Ronnald Ramp, A1GS, A2GS, Tillman Sers, SPEI   Chemistry      Component Value Date/Time   NA 137 09/07/2017 0857   K 3.8 09/07/2017 0857   CL 103 09/07/2017 0857   CO2 27 09/07/2017 0857   BUN 5 (L) 09/07/2017 0857   CREATININE 0.98 09/07/2017 0857      Component Value Date/Time   CALCIUM 9.6 09/07/2017 0857   ALKPHOS 52 09/07/2017 0857   AST 12 09/07/2017 0857   ALT 11 09/07/2017 0857   BILITOT 0.4 09/07/2017 0857       Impression and Plan: Angela Jimenez is a very pleasant 47 yo African American female with iron deficiency anemia secondary to heavy cycles and malabsorption. She is still having some symptoms as mentioned above.  We will see what her iron studies show and bring her back in for infusion if needed.  We will go ahead and plan to see her back in another 2 months.  She will contact our office with any questions or concerns. We can certainly see her sooner if need be.   Laverna Peace, NP 6/3/201911:56 AM

## 2017-10-24 ENCOUNTER — Other Ambulatory Visit: Payer: Self-pay | Admitting: Family

## 2017-10-24 LAB — IRON AND TIBC
Iron: 34 ug/dL — ABNORMAL LOW (ref 41–142)
Saturation Ratios: 9 % — ABNORMAL LOW (ref 21–57)
TIBC: 374 ug/dL (ref 236–444)
UIBC: 339 ug/dL

## 2017-10-24 LAB — FERRITIN: Ferritin: 8 ng/mL — ABNORMAL LOW (ref 9–269)

## 2017-10-26 DIAGNOSIS — Z880 Allergy status to penicillin: Secondary | ICD-10-CM | POA: Diagnosis not present

## 2017-10-26 DIAGNOSIS — T7840XA Allergy, unspecified, initial encounter: Secondary | ICD-10-CM | POA: Diagnosis not present

## 2017-10-26 DIAGNOSIS — R0602 Shortness of breath: Secondary | ICD-10-CM | POA: Diagnosis not present

## 2017-10-26 DIAGNOSIS — Z881 Allergy status to other antibiotic agents status: Secondary | ICD-10-CM | POA: Diagnosis not present

## 2017-10-26 DIAGNOSIS — J45909 Unspecified asthma, uncomplicated: Secondary | ICD-10-CM | POA: Diagnosis not present

## 2017-10-26 DIAGNOSIS — Z91010 Allergy to peanuts: Secondary | ICD-10-CM | POA: Diagnosis not present

## 2017-10-26 DIAGNOSIS — Z91018 Allergy to other foods: Secondary | ICD-10-CM | POA: Diagnosis not present

## 2017-10-26 DIAGNOSIS — F419 Anxiety disorder, unspecified: Secondary | ICD-10-CM | POA: Diagnosis not present

## 2017-10-31 DIAGNOSIS — D251 Intramural leiomyoma of uterus: Secondary | ICD-10-CM | POA: Diagnosis not present

## 2017-10-31 DIAGNOSIS — D252 Subserosal leiomyoma of uterus: Secondary | ICD-10-CM | POA: Diagnosis not present

## 2017-10-31 DIAGNOSIS — R102 Pelvic and perineal pain: Secondary | ICD-10-CM | POA: Diagnosis not present

## 2017-11-06 ENCOUNTER — Other Ambulatory Visit: Payer: Self-pay

## 2017-11-06 ENCOUNTER — Inpatient Hospital Stay: Payer: PPO

## 2017-11-06 VITALS — BP 115/6 | HR 74 | Temp 98.2°F | Resp 18

## 2017-11-06 DIAGNOSIS — N921 Excessive and frequent menstruation with irregular cycle: Secondary | ICD-10-CM

## 2017-11-06 DIAGNOSIS — D500 Iron deficiency anemia secondary to blood loss (chronic): Secondary | ICD-10-CM | POA: Diagnosis not present

## 2017-11-06 DIAGNOSIS — K909 Intestinal malabsorption, unspecified: Secondary | ICD-10-CM

## 2017-11-06 MED ORDER — SODIUM CHLORIDE 0.9 % IV SOLN
510.0000 mg | Freq: Once | INTRAVENOUS | Status: AC
  Administered 2017-11-06: 510 mg via INTRAVENOUS
  Filled 2017-11-06: qty 17

## 2017-11-06 MED ORDER — SODIUM CHLORIDE 0.9 % IV SOLN
Freq: Once | INTRAVENOUS | Status: AC
  Administered 2017-11-06: 13:00:00 via INTRAVENOUS

## 2017-11-06 NOTE — Patient Instructions (Signed)

## 2017-11-13 ENCOUNTER — Other Ambulatory Visit: Payer: Self-pay

## 2017-11-13 ENCOUNTER — Inpatient Hospital Stay: Payer: PPO

## 2017-11-13 VITALS — BP 133/73 | HR 82 | Temp 98.2°F | Resp 18

## 2017-11-13 DIAGNOSIS — D500 Iron deficiency anemia secondary to blood loss (chronic): Secondary | ICD-10-CM

## 2017-11-13 DIAGNOSIS — N921 Excessive and frequent menstruation with irregular cycle: Secondary | ICD-10-CM

## 2017-11-13 DIAGNOSIS — K909 Intestinal malabsorption, unspecified: Secondary | ICD-10-CM

## 2017-11-13 MED ORDER — SODIUM CHLORIDE 0.9 % IV SOLN
510.0000 mg | Freq: Once | INTRAVENOUS | Status: AC
  Administered 2017-11-13: 510 mg via INTRAVENOUS
  Filled 2017-11-13: qty 17

## 2017-11-13 MED ORDER — SODIUM CHLORIDE 0.9 % IV SOLN
Freq: Once | INTRAVENOUS | Status: AC
  Administered 2017-11-13: 13:00:00 via INTRAVENOUS

## 2017-11-13 NOTE — Patient Instructions (Signed)
Ferumoxytol injection Angela Jimenez) What is this medicine? FERUMOXYTOL is an iron complex. Iron is used to make healthy red blood cells, which carry oxygen and nutrients throughout the body. This medicine is used to treat iron deficiency anemia in people with chronic kidney disease. This medicine Jimenez be used for other purposes; ask your health care provider or pharmacist if you have questions. COMMON BRAND NAME(S): Feraheme What should I tell my health care provider before I take this medicine? They need to know if you have any of these conditions: -anemia not caused by low iron levels -high levels of iron in the blood -magnetic resonance imaging (MRI) test scheduled -an unusual or allergic reaction to iron, other medicines, foods, dyes, or preservatives -pregnant or trying to get pregnant -breast-feeding How should I use this medicine? This medicine is for injection into a vein. It is given by a health care professional in a hospital or clinic setting. Talk to your pediatrician regarding the use of this medicine in children. Special care Jimenez be needed. Overdosage: If you think you have taken too much of this medicine contact a poison control center or emergency room at once. NOTE: This medicine is only for you. Do not share this medicine with others. What if I miss a dose? It is important not to miss your dose. Call your doctor or health care professional if you are unable to keep an appointment. What Jimenez interact with this medicine? This medicine Jimenez interact with the following medications: -other iron products This list Jimenez not describe all possible interactions. Give your health care provider a list of all the medicines, herbs, non-prescription drugs, or dietary supplements you use. Also tell them if you smoke, drink alcohol, or use illegal drugs. Some items Jimenez interact with your medicine. What should I watch for while using this medicine? Visit your doctor or healthcare professional  regularly. Tell your doctor or healthcare professional if your symptoms do not start to get better or if they get worse. You Jimenez need blood work done while you are taking this medicine. You Jimenez need to follow a special diet. Talk to your doctor. Foods that contain iron include: whole grains/cereals, dried fruits, beans, or peas, leafy green vegetables, and organ meats (liver, kidney). What side effects Jimenez I notice from receiving this medicine? Side effects that you should report to your doctor or health care professional as soon as possible: -allergic reactions like skin rash, itching or hives, swelling of the face, lips, or tongue -breathing problems -changes in blood pressure -feeling faint or lightheaded, falls -fever or chills -flushing, sweating, or hot feelings -swelling of the ankles or feet Side effects that usually do not require medical attention (report to your doctor or health care professional if they continue or are bothersome): -diarrhea -headache -nausea, vomiting -stomach pain This list Jimenez not describe all possible side effects. Call your doctor for medical advice about side effects. You Jimenez report side effects to FDA at 1-800-FDA-1088. Where should I keep my medicine? This drug is given in a hospital or clinic and will not be stored at home. NOTE: This sheet is a summary. It Jimenez not cover all possible information. If you have questions about this medicine, talk to your doctor, pharmacist, or health care provider.  2018 Elsevier/Gold Standard (2015-06-11 12:41:49)

## 2017-11-22 DIAGNOSIS — R102 Pelvic and perineal pain: Secondary | ICD-10-CM | POA: Diagnosis not present

## 2017-11-22 DIAGNOSIS — R399 Unspecified symptoms and signs involving the genitourinary system: Secondary | ICD-10-CM | POA: Diagnosis not present

## 2017-11-29 DIAGNOSIS — R1030 Lower abdominal pain, unspecified: Secondary | ICD-10-CM | POA: Diagnosis not present

## 2017-11-29 DIAGNOSIS — N946 Dysmenorrhea, unspecified: Secondary | ICD-10-CM | POA: Diagnosis not present

## 2017-11-29 DIAGNOSIS — D252 Subserosal leiomyoma of uterus: Secondary | ICD-10-CM | POA: Diagnosis not present

## 2017-12-13 DIAGNOSIS — R197 Diarrhea, unspecified: Secondary | ICD-10-CM | POA: Diagnosis not present

## 2017-12-18 ENCOUNTER — Inpatient Hospital Stay: Payer: PPO | Attending: Family | Admitting: Family

## 2017-12-18 ENCOUNTER — Inpatient Hospital Stay: Payer: PPO

## 2017-12-18 ENCOUNTER — Other Ambulatory Visit: Payer: Self-pay

## 2017-12-18 ENCOUNTER — Encounter: Payer: Self-pay | Admitting: Family

## 2017-12-18 VITALS — BP 115/75 | HR 78 | Temp 98.4°F | Resp 18 | Wt 132.0 lb

## 2017-12-18 DIAGNOSIS — N920 Excessive and frequent menstruation with regular cycle: Secondary | ICD-10-CM | POA: Insufficient documentation

## 2017-12-18 DIAGNOSIS — D500 Iron deficiency anemia secondary to blood loss (chronic): Secondary | ICD-10-CM | POA: Diagnosis not present

## 2017-12-18 DIAGNOSIS — K909 Intestinal malabsorption, unspecified: Secondary | ICD-10-CM

## 2017-12-18 DIAGNOSIS — E559 Vitamin D deficiency, unspecified: Secondary | ICD-10-CM

## 2017-12-18 DIAGNOSIS — D508 Other iron deficiency anemias: Secondary | ICD-10-CM

## 2017-12-18 DIAGNOSIS — N921 Excessive and frequent menstruation with irregular cycle: Secondary | ICD-10-CM

## 2017-12-18 LAB — CBC WITH DIFFERENTIAL (CANCER CENTER ONLY)
Basophils Absolute: 0 10*3/uL (ref 0.0–0.1)
Basophils Relative: 0 %
Eosinophils Absolute: 0.1 10*3/uL (ref 0.0–0.5)
Eosinophils Relative: 2 %
HCT: 39.6 % (ref 34.8–46.6)
Hemoglobin: 13.3 g/dL (ref 11.6–15.9)
Lymphocytes Relative: 38 %
Lymphs Abs: 1.1 10*3/uL (ref 0.9–3.3)
MCH: 31.4 pg (ref 26.0–34.0)
MCHC: 33.6 g/dL (ref 32.0–36.0)
MCV: 93.6 fL (ref 81.0–101.0)
Monocytes Absolute: 0.2 10*3/uL (ref 0.1–0.9)
Monocytes Relative: 7 %
Neutro Abs: 1.5 10*3/uL (ref 1.5–6.5)
Neutrophils Relative %: 53 %
Platelet Count: 212 10*3/uL (ref 145–400)
RBC: 4.23 MIL/uL (ref 3.70–5.32)
RDW: 12.8 % (ref 11.1–15.7)
WBC Count: 2.9 10*3/uL — ABNORMAL LOW (ref 3.9–10.0)

## 2017-12-18 LAB — RETICULOCYTES
RBC.: 4.3 MIL/uL (ref 3.70–5.45)
Retic Count, Absolute: 38.7 10*3/uL (ref 33.7–90.7)
Retic Ct Pct: 0.9 % (ref 0.7–2.1)

## 2017-12-18 NOTE — Progress Notes (Signed)
Hematology and Oncology Follow Up Visit  Angela Jimenez [id number] Feb 16, 1971 47 y.o. 12/18/2017   Principle Diagnosis:  Iron deficiency anemia secondary to malabsorptionand heavy cycles  Current Therapy:   IV iron as indicated - last received in June 2019 x 2   Interim History:  Angela Jimenez is here today for follow-up. She is feeling a bit better since Angela Jimenez two iron infusions last month. Angela Jimenez Hgb is now 13.3, MCV 93.  Angela Jimenez cycle remains regular and heavy. She is scheduled for an open myectomy on 01/01/2018.   No other bleeding, no bruising or petechiae. No lymphadenopathy.  She has had no issue with infections. No fever, chills, n/v, cough, rash, chest pain, abdominal pain or changes in bowel or bladder habits.  She has occasional episodes of lightheadedness and palpitations. Angela Jimenez SOB has improved and she is not needing Angela Jimenez inhaler anymore.   No swelling or tenderness in Angela Jimenez extremities at this time. The numbness or tingling in Angela Jimenez extremities comes and goes.  She has a good appetite and is staying well hydrated. Angela Jimenez weight is stable.   ECOG Performance Status: 1 - Symptomatic but completely ambulatory  Medications:  Allergies as of 12/18/2017      Reactions   Amoxicillin Anaphylaxis   Other Other (See Comments), Hives   Tree nuts, anaphylaxis and migraine headache   Peanut-containing Drug Products Anaphylaxis   Penicillins Anaphylaxis   Wheat Bran    Prednisone Other (See Comments)   "Makes me numb and tingly" increases eye pressure.    Corn-containing Products    Molds & Smuts Hives   Shortness of breath   Sulfa Antibiotics    Other reaction(s): UNSPECIFIED      Medication List        Accurate as of 12/18/17 11:51 AM. Always use your most recent med list.          budesonide-formoterol 160-4.5 MCG/ACT inhaler Commonly known as:  SYMBICORT Two puffs every 12 hours to prevent cough or wheeze. Rinse, gargle and spit after use.   cetirizine 10 MG tablet Commonly known  as:  ZYRTEC Take 10 mg by mouth daily. Takes 1 tablet at night.   cyclobenzaprine 10 MG tablet Commonly known as:  FLEXERIL Take 10 mg by mouth as needed for muscle spasms.   EPINEPHrine 0.3 mg/0.3 mL Soaj injection Commonly known as:  EPIPEN 2-PAK Use as directed for severe allergic reactions   ibuprofen 800 MG tablet Commonly known as:  ADVIL,MOTRIN Take 800 mg by mouth as needed.   IRON PO Take by mouth.   levocetirizine 5 MG tablet Commonly known as:  XYZAL TAKE 1 TABLET BY MOUTH EVERY DAY IN THE EVENING   montelukast 10 MG tablet Commonly known as:  SINGULAIR Take 1 tablet (10 mg total) by mouth at bedtime.   naproxen 500 MG tablet Commonly known as:  NAPROSYN Take by mouth as needed.   PATADAY 0.2 % Soln Generic drug:  Olopatadine HCl ONE DROP EACH EYE ONCE A DAY FOR ITCHY EYES   QVAR REDIHALER 80 MCG/ACT inhaler Generic drug:  beclomethasone PREPARE STUDY-QVAR IN ADDITION to daily maintenance meds. Take QVAR 1 puff for EACH puff of beta-agonist or 5 puffs with each nebulizer use   ranitidine 150 MG tablet Commonly known as:  ZANTAC One tablet twice a day for reflux   traMADol 50 MG tablet Commonly known as:  ULTRAM Take by mouth as needed.       Allergies:  Allergies  Allergen Reactions  .  Amoxicillin Anaphylaxis  . Other Other (See Comments) and Hives    Tree nuts, anaphylaxis and migraine headache  . Peanut-Containing Drug Products Anaphylaxis  . Penicillins Anaphylaxis  . Wheat Bran   . Prednisone Other (See Comments)    "Makes me numb and tingly" increases eye pressure.   . Corn-Containing Products   . Molds & Smuts Hives    Shortness of breath  . Sulfa Antibiotics     Other reaction(s): UNSPECIFIED    Past Medical History, Surgical history, Social history, and Family History were reviewed and updated.  Review of Systems: All other 10 point review of systems is negative.   Physical Exam:  weight is 132 lb (59.9 kg). Angela Jimenez oral  temperature is 98.4 F (36.9 C). Angela Jimenez blood pressure is 115/75 and Angela Jimenez pulse is 78. Angela Jimenez respiration is 18 and oxygen saturation is 100%.   Wt Readings from Last 3 Encounters:  12/18/17 132 lb (59.9 kg)  10/23/17 137 lb (62.1 kg)  08/08/17 142 lb 6.4 oz (64.6 kg)    Ocular: Sclerae unicteric, pupils equal, round and reactive to light Ear-nose-throat: Oropharynx clear, dentition fair Lymphatic: No cervical, supraclavicular or axillary adenopathy Lungs no rales or rhonchi, good excursion bilaterally Heart regular rate and rhythm, no murmur appreciated Abd soft, nontender, positive bowel sounds, no liver or spleen tip palpated on exam, no fluid wave  MSK no focal spinal tenderness, no joint edema Neuro: non-focal, well-oriented, appropriate affect Breasts: Deferred   Lab Results  Component Value Date   WBC 2.9 (L) 12/18/2017   HGB 13.3 12/18/2017   HCT 39.6 12/18/2017   MCV 93.6 12/18/2017   PLT 212 12/18/2017   Lab Results  Component Value Date   FERRITIN 8 (L) 10/23/2017   IRON 34 (L) 10/23/2017   TIBC 374 10/23/2017   UIBC 339 10/23/2017   IRONPCTSAT 9 (L) 10/23/2017   Lab Results  Component Value Date   RETICCTPCT 1.1 10/23/2017   RBC 4.23 12/18/2017   No results found for: KPAFRELGTCHN, LAMBDASER, KAPLAMBRATIO No results found for: IGGSERUM, IGA, IGMSERUM No results found for: Ronnald Ramp, A1GS, A2GS, Tillman Sers, SPEI   Chemistry      Component Value Date/Time   NA 137 09/07/2017 0857   K 3.8 09/07/2017 0857   CL 103 09/07/2017 0857   CO2 27 09/07/2017 0857   BUN 5 (L) 09/07/2017 0857   CREATININE 0.98 09/07/2017 0857      Component Value Date/Time   CALCIUM 9.6 09/07/2017 0857   ALKPHOS 52 09/07/2017 0857   AST 12 09/07/2017 0857   ALT 11 09/07/2017 0857   BILITOT 0.4 09/07/2017 0857      Impression and Plan: Angela Jimenez is a very pleasant 47 yo African American female with iron deficiency anemia secondary to heavy cycles  and malabsorption. She is feeling better since receiving 2 doses of IV iron in June.  She is now scheduled for an open myectomy on 01/01/2018. We will see what Angela Jimenez iron studies show and bring Angela Jimenez back in for infusion if needed.  We will go ahead and plan to see Angela Jimenez back in another 6 weeks for follow-up.  She will contact our office with any questions or concerns. We can certainly see Angela Jimenez sooner if need be.   Laverna Peace, NP 7/29/201911:51 AM

## 2017-12-19 LAB — IRON AND TIBC
Iron: 112 ug/dL (ref 41–142)
Saturation Ratios: 43 % (ref 21–57)
TIBC: 264 ug/dL (ref 236–444)
UIBC: 151 ug/dL

## 2017-12-19 LAB — FERRITIN: Ferritin: 186 ng/mL (ref 11–307)

## 2017-12-19 LAB — VITAMIN D 25 HYDROXY (VIT D DEFICIENCY, FRACTURES): Vit D, 25-Hydroxy: 34.2 ng/mL (ref 30.0–100.0)

## 2018-01-01 DIAGNOSIS — D259 Leiomyoma of uterus, unspecified: Secondary | ICD-10-CM | POA: Insufficient documentation

## 2018-01-01 DIAGNOSIS — D250 Submucous leiomyoma of uterus: Secondary | ICD-10-CM | POA: Diagnosis not present

## 2018-01-01 DIAGNOSIS — Z91018 Allergy to other foods: Secondary | ICD-10-CM | POA: Diagnosis not present

## 2018-01-01 DIAGNOSIS — Z880 Allergy status to penicillin: Secondary | ICD-10-CM | POA: Diagnosis not present

## 2018-01-01 DIAGNOSIS — N946 Dysmenorrhea, unspecified: Secondary | ICD-10-CM | POA: Diagnosis not present

## 2018-01-01 DIAGNOSIS — Z882 Allergy status to sulfonamides status: Secondary | ICD-10-CM | POA: Diagnosis not present

## 2018-01-01 DIAGNOSIS — Z91040 Latex allergy status: Secondary | ICD-10-CM | POA: Diagnosis not present

## 2018-01-01 DIAGNOSIS — Z91010 Allergy to peanuts: Secondary | ICD-10-CM | POA: Diagnosis not present

## 2018-01-01 DIAGNOSIS — Z79899 Other long term (current) drug therapy: Secondary | ICD-10-CM | POA: Diagnosis not present

## 2018-01-01 DIAGNOSIS — K219 Gastro-esophageal reflux disease without esophagitis: Secondary | ICD-10-CM | POA: Diagnosis not present

## 2018-01-01 DIAGNOSIS — D611 Drug-induced aplastic anemia: Secondary | ICD-10-CM | POA: Diagnosis not present

## 2018-01-01 DIAGNOSIS — D252 Subserosal leiomyoma of uterus: Secondary | ICD-10-CM | POA: Diagnosis not present

## 2018-01-01 DIAGNOSIS — D251 Intramural leiomyoma of uterus: Secondary | ICD-10-CM | POA: Diagnosis not present

## 2018-01-04 ENCOUNTER — Other Ambulatory Visit: Payer: Self-pay

## 2018-01-04 NOTE — Patient Outreach (Signed)
[facility]) Care Management  01/04/2018  Angela Jimenez 09/05/1970 [id number]   Referral Date: 01/04/18 Referral Source:  HTA Report Date of Admission:  01/01/18 Diagnosis:  Myomectomy Date of Discharge: 01/03/18 Facility: [facility] Insurance:  HTA  Outreach attempt: no answer.  HIPAA compliant voice message left.    Plan: RN CM will send letter and attempt patient again within 4 business days.   Jone Baseman, RN, MSN [facility] Care Management Care Management Coordinator Direct Line [phone] Toll Free: [phone]  Fax: [phone]

## 2018-01-04 NOTE — Patient Outreach (Signed)
[facility]) Care Management  01/04/2018  CAROL THEYS [birth year] [id number]   Referral Date: 01/04/18 Referral Source:  HTA Report Date of Admission:  01/01/18 Diagnosis:  Myomectomy Date of Discharge: 01/03/18 Facility: [facility] Insurance:  HTA  Incoming call from patient.  She is able to verify HIPAA.  Discussed reason for referral.  Patient acknowledges hospitalization.  She states she is doing ok but voiced it being hard not to do anything.  Advised patient on importance of following activity instructions.  She verbalized understanding.    Patient lives in the home with her daughter who is 91.  She reports that her daughter is available to assist her as needed.  Patient discussed with patient myomectomy and watching surgical site for infection.  She verbalized understanding.  Patient does not have an appointment with the surgeon until September 25th.  Advised patient on importance of follow up and offered to assist with making an appointment with her PCP.  Patient declined and states she will follow up.  Patient reports some nausea with her pain medication but has zofran as needed.    Discussed with patient [facility] and how [facility] could assist.  Patient declined services presently but is agreeable to receive letter.     Plan: RN CM will close case at this time.  Jone Baseman, RN, MSN [facility] Care Management Coordinator Direct Line [phone] Cell [phone] Toll Free: [phone]  Fax: [phone]

## 2018-01-09 ENCOUNTER — Other Ambulatory Visit: Payer: Self-pay

## 2018-01-09 NOTE — Patient Outreach (Signed)
[facility]) Care Management  01/09/2018  Angela Jimenez December 23, 1970 [id number]   Incoming call from patient.  She is able to verify HIPAA.  Discussed reason for referral. Patient states she was wanting an abdominal binder for stomach support post-op.  Advised patient that [facility] does not cover such an item.  However, advised patient that her doctor could write an order for it if they think it would be necessary and having order sent to a medical supply company and they could bill to see if it would be covered by her HTA.  Offered to assist patient with contacting her physician for the request.  She states that she will call about it.  Patient declines any other needs presently.    Plan: RN CM will close case at this time.    Jone Baseman, RN, MSN [facility] Care Management Coordinator Direct Line [phone] Cell [phone] Toll Free: [phone]  Fax: [phone]

## 2018-01-09 NOTE — Patient Outreach (Signed)
[facility]) Care Management  01/09/2018  SHELISE MARON 1970/08/28 [id number]   Referral Date: 01/09/18 Referral Source: HTA concierge Referral Reason: Assistance with DME   Outreach Attempt: No answer.  HIPAA compliant voice message left.   Plan: RN CM will send letter and attempt patient again within 4 business days.     Jone Baseman, RN, MSN [facility] Care Management Care Management Coordinator Direct Line [phone] Toll Free: [phone]  Fax: [phone]

## 2018-01-10 ENCOUNTER — Ambulatory Visit: Payer: PPO

## 2018-01-15 DIAGNOSIS — E611 Iron deficiency: Secondary | ICD-10-CM | POA: Diagnosis not present

## 2018-01-15 DIAGNOSIS — F419 Anxiety disorder, unspecified: Secondary | ICD-10-CM | POA: Diagnosis not present

## 2018-01-15 DIAGNOSIS — R42 Dizziness and giddiness: Secondary | ICD-10-CM | POA: Diagnosis not present

## 2018-01-15 DIAGNOSIS — G8929 Other chronic pain: Secondary | ICD-10-CM | POA: Diagnosis not present

## 2018-01-24 DIAGNOSIS — Z09 Encounter for follow-up examination after completed treatment for conditions other than malignant neoplasm: Secondary | ICD-10-CM | POA: Diagnosis not present

## 2018-01-24 DIAGNOSIS — R42 Dizziness and giddiness: Secondary | ICD-10-CM | POA: Diagnosis not present

## 2018-02-05 ENCOUNTER — Ambulatory Visit (INDEPENDENT_AMBULATORY_CARE_PROVIDER_SITE_OTHER): Payer: PPO | Admitting: Family Medicine

## 2018-02-05 ENCOUNTER — Encounter: Payer: Self-pay | Admitting: Family Medicine

## 2018-02-05 VITALS — BP 106/66 | HR 72 | Temp 98.0°F | Resp 24

## 2018-02-05 DIAGNOSIS — K219 Gastro-esophageal reflux disease without esophagitis: Secondary | ICD-10-CM

## 2018-02-05 DIAGNOSIS — J3089 Other allergic rhinitis: Secondary | ICD-10-CM

## 2018-02-05 DIAGNOSIS — J302 Other seasonal allergic rhinitis: Secondary | ICD-10-CM | POA: Diagnosis not present

## 2018-02-05 DIAGNOSIS — T7800XD Anaphylactic reaction due to unspecified food, subsequent encounter: Secondary | ICD-10-CM

## 2018-02-05 DIAGNOSIS — J4540 Moderate persistent asthma, uncomplicated: Secondary | ICD-10-CM | POA: Diagnosis not present

## 2018-02-05 NOTE — Patient Instructions (Addendum)
Continue cetirizine 10 mg-take 1 tablet once a day for runny nose or itchy eyes Continue Nasacort 1 spray per nostril twice a day for stuffy nose Nasal saline irrigations at night before using Nasacort Montelukast  10 mg-take 1 tablet once a day to prevent coughing or wheezing Continue Symbicort 160- 2 puffs twice a day with a spacer. Rinse and spit after each use. Begin ranitidine 150 mg twice a day. Avoid all caffeine (soda, tea, and chocolate are examples) Call us if you're not doing well on this treatment plan Continue on your other medications  Avoid almond milk, peanuts tree nuts, wheat, and corn products. If you have an allergic reaction, take Benadryl 50 mg every 4 hours, and if you have life-threatening symptoms, inject with EpiPen 0.3 mg.   We will submit this to your insurance. Plan to follow up in 2-3 weeks in the clinic for the first oral immunotherapy.  Follow up in 6 months or sooner if needed.

## 2018-02-05 NOTE — Progress Notes (Signed)
[place] [address] Dept: [phone]  FOLLOW UP NOTE  Patient ID: Angela Jimenez, female    DOB: [birth year]  Age: 47 y.o. MRN: [id number] Date of Office Visit: 02/05/2018  Assessment  Chief Complaint: Allergic Rhinitis ; Asthma; and Breathing Problem  HPI Angela Jimenez is a 47 year old female who presents to the clinic for a follow up visit. She was last seen in this clinic on 09/18/2017 by Dr. Shaune Leeks for evaluation of asthma, allergic rhinitis, reflux and food allergy. At that time, asthma was reported as well controlled with Symbicort 160-2 puffs twice a day, montelukast 10 mg once a day, and albuterol as needed. Allergic rhinitis was well controlled with Xyzal and cetrizine. She continued to avoid soy, nuts, and corn products due to gastrointestinal sensitivity. She continued ranitidine at that time with good control of reflux.  At today's visit, she reports her asthma has been well controlled.  She reports occasional shortness of breath with activity and rest.  She denies wheeze and cough with activity and rest.  She reports that she has had 2 iron transfusions this year and her breathing has improved with each transfusion.  She is currently using Symbicort 2 puffs as needed which is about 2 or 3 days a week, montelukast 10 mg once a day, and using her albuterol inhaler 1-2 times a week.   Allergic rhinitis is reported is not well controlled with symptoms including headache and nasal congestion.  She continues an antihistamine once a day and occasional saline nasal rinses.  She is not currently using any medicated nasal sprays.  She reports triggers for headaches and allergic rhinitis as weather change in the winter and spring, Christmas trees, and the following plants.  She is interested in oral immunotherapy for her environmental triggers.  She continues to avoid corn products, wheat, peanuts, and tree nuts.  She has not had any accidental ingestions.  She did use  her epinephrine pen on 10/26/2017 which was related to a ciprofloxacin reaction that was prescribed for a UTI.  She has not needed to use her EpiPen for food allergy related symptoms.  Reflux is reported as mildly well controlled.  She takes ranitidine 150 mg as needed and avoids foods that increase her reflux including soy and spaghetti.  Her current medications are listed in the chart.  Drug Allergies:  Allergies  Allergen Reactions  . Amoxicillin Anaphylaxis  . Other Other (See Comments) and Hives    Tree nuts, anaphylaxis and migraine headache migranes  . Peanut-Containing Drug Products Anaphylaxis  . Penicillins Anaphylaxis  . Wheat Bran   . Latex Hives  . Prednisone Other (See Comments)    "Makes me numb and tingly" increases eye pressure.   . Ciprofloxacin     Anaphylaxis   . Ciprofloxacin-Hydrocortisone   . Corn-Containing Products   . Molds & Smuts Hives    Shortness of breath  . Sulfa Antibiotics     Other reaction(s): UNSPECIFIED    Physical Exam: BP 106/66 (BP Location: Right Arm, Patient Position: Sitting, Cuff Size: Normal)   Pulse 72   Temp 98 F (36.7 C) (Oral)   Resp (!) 24   SpO2 98%    Physical Exam  Constitutional: She is oriented to person, place, and time. She appears well-developed and well-nourished.  HENT:  Head: Normocephalic.  Right Ear: External ear normal.  Left Ear: External ear normal.  Mouth/Throat: Oropharynx is clear and moist.  Bilateral nares slightly erythematous and  edematous with no nasal drainage noted.  Pharynx normal.  Ears normal.  Eyes normal.  Eyes: Conjunctivae are normal.  Neck: Normal range of motion. Neck supple.  Cardiovascular: Normal rate, regular rhythm and normal heart sounds.  No murmur noted  Pulmonary/Chest: Effort normal and breath sounds normal.  Lungs clear to auscultation  Musculoskeletal: Normal range of motion.  Neurological: She is alert and oriented to person, place, and time.  Skin: Skin is warm and  dry.  Psychiatric: She has a normal mood and affect. Her behavior is normal. Judgment and thought content normal.  Vitals reviewed.   Diagnostics: FVC 2.41, FEV1 1.92. Predicted FVC 2.72, predicted FEV1 2.20. Spirometry is within the normal limit.   Assessment and Plan: 1. Moderate persistent asthma without complication   2. Anaphylaxis due to food, subsequent encounter   3. Seasonal and perennial allergic rhinitis   4. Gastroesophageal reflux disease without esophagitis     No orders of the defined types were placed in this encounter.   Patient Instructions  Continue cetirizine 10 mg-take 1 tablet once a day for runny nose or itchy eyes Continue Nasacort 1 spray per nostril twice a day for stuffy nose Nasal saline irrigations at night before using Nasacort Montelukast  10 mg-take 1 tablet once a day to prevent coughing or wheezing Continue Symbicort 160- 2 puffs twice a day with a spacer. Rinse and spit after each use. Begin ranitidine 150 mg twice a day. Avoid all caffeine (soda, tea, and chocolate are examples) Call us if you're not doing well on this treatment plan Continue on your other medications  Avoid almond milk, peanuts tree nuts, wheat, and corn products. If you have an allergic reaction, take Benadryl 50 mg every 4 hours, and if you have life-threatening symptoms, inject with EpiPen 0.3 mg.   We will submit this to your insurance. Plan to follow up in 2-3 weeks in the clinic for the first oral immunotherapy.  Follow up in 6 months or sooner if needed.  Return in about 3 weeks (around 02/26/2018), or if symptoms worsen or fail to improve.   Thank you for the opportunity to care for this patient.  Please do not hesitate to contact me with questions.  Gareth Morgan, FNP [facility]  I have provided oversight concerning Gareth Morgan' evaluation and treatment of this patient's health issues addressed during today's  encounter. I agree with the assessment and therapeutic plan as outlined in the note.   Thank you for the opportunity to care for this patient.  Please do not hesitate to contact me with questions.  Penne Lash, M.D.  [facility] [address] [place], [place] [address] [phone]

## 2018-02-12 ENCOUNTER — Inpatient Hospital Stay: Payer: PPO

## 2018-02-12 ENCOUNTER — Inpatient Hospital Stay: Payer: PPO | Admitting: Family

## 2018-02-13 ENCOUNTER — Other Ambulatory Visit: Payer: Self-pay

## 2018-02-13 ENCOUNTER — Inpatient Hospital Stay: Payer: PPO | Attending: Family

## 2018-02-13 ENCOUNTER — Inpatient Hospital Stay (HOSPITAL_BASED_OUTPATIENT_CLINIC_OR_DEPARTMENT_OTHER): Payer: PPO | Admitting: Family

## 2018-02-13 ENCOUNTER — Encounter: Payer: Self-pay | Admitting: Family

## 2018-02-13 VITALS — BP 127/83 | HR 77 | Temp 98.1°F | Resp 19 | Wt 129.1 lb

## 2018-02-13 DIAGNOSIS — K909 Intestinal malabsorption, unspecified: Secondary | ICD-10-CM | POA: Insufficient documentation

## 2018-02-13 DIAGNOSIS — N921 Excessive and frequent menstruation with irregular cycle: Secondary | ICD-10-CM

## 2018-02-13 DIAGNOSIS — D500 Iron deficiency anemia secondary to blood loss (chronic): Secondary | ICD-10-CM | POA: Diagnosis not present

## 2018-02-13 DIAGNOSIS — D508 Other iron deficiency anemias: Secondary | ICD-10-CM | POA: Diagnosis not present

## 2018-02-13 DIAGNOSIS — N920 Excessive and frequent menstruation with regular cycle: Secondary | ICD-10-CM | POA: Diagnosis not present

## 2018-02-13 LAB — CBC WITH DIFFERENTIAL (CANCER CENTER ONLY)
Basophils Absolute: 0 10*3/uL (ref 0.0–0.1)
Basophils Relative: 1 %
Eosinophils Absolute: 0.1 10*3/uL (ref 0.0–0.5)
Eosinophils Relative: 4 %
HCT: 38.6 % (ref 34.8–46.6)
Hemoglobin: 12.7 g/dL (ref 11.6–15.9)
Lymphocytes Relative: 50 %
Lymphs Abs: 1.5 10*3/uL (ref 0.9–3.3)
MCH: 31.4 pg (ref 26.0–34.0)
MCHC: 32.9 g/dL (ref 32.0–36.0)
MCV: 95.3 fL (ref 81.0–101.0)
Monocytes Absolute: 0.2 10*3/uL (ref 0.1–0.9)
Monocytes Relative: 8 %
Neutro Abs: 1.1 10*3/uL — ABNORMAL LOW (ref 1.5–6.5)
Neutrophils Relative %: 37 %
Platelet Count: 281 10*3/uL (ref 145–400)
RBC: 4.05 MIL/uL (ref 3.70–5.32)
RDW: 11.9 % (ref 11.1–15.7)
WBC Count: 2.9 10*3/uL — ABNORMAL LOW (ref 3.9–10.0)

## 2018-02-13 LAB — RETICULOCYTES
RBC.: 4.07 MIL/uL (ref 3.70–5.45)
Retic Count, Absolute: 32.6 10*3/uL — ABNORMAL LOW (ref 33.7–90.7)
Retic Ct Pct: 0.8 % (ref 0.7–2.1)

## 2018-02-13 NOTE — Progress Notes (Signed)
This encounter was created in error - please disregard.

## 2018-02-13 NOTE — Progress Notes (Signed)
Hematology and Oncology Follow Up Visit  Angela Jimenez [id number] Jun 24, 1970 47 y.o. 02/13/2018   Principle Diagnosis:  Iron deficiency anemia secondary to malabsorptionand heavy cycles  Current Therapy:   IV iron as indicated - last received in June 2019 x 2   Interim History:  [name] is here today for follow-up. She had a myomectomy on August 12ht and states she had 19 fibroids removed. She still has some abdominal tenderness from surgery and follow up with her surgeon, Dr. Ileene Patrick, tomorrow.  She has had two cycles since her surgery. She is symptomatic with fatigue and SOB with over exertion.  No other episodes of bleeding to report. No bruising or petechiae.  Hgb is stable at 12.7 with an MCV of 95.  She has had no fever, chills, n/v, cough, rash, dizziness, chest pain, palpitations, abdominal pain or changes in bladder habits.  She has been taking Miralax as needed for constipation since her surgery.  No swelling, tenderness, numbness or tingling in her extremities.  No lymphadenopathy noted on exam.  She has maintained a good appetite and is staying well hydrated. Her weight is stable.  ECOG Performance Status: 1 - Symptomatic but completely ambulatory  Medications:  Allergies as of 02/13/2018      Reactions   Amoxicillin Anaphylaxis   Other Other (See Comments), Hives   Tree nuts, anaphylaxis and migraine headache migranes   Peanut-containing Drug Products Anaphylaxis   Penicillins Anaphylaxis   Wheat Bran    Latex Hives   Prednisone Other (See Comments)   "Makes me numb and tingly" increases eye pressure.    Ciprofloxacin    Anaphylaxis   Ciprofloxacin-hydrocortisone    Corn-containing Products    Molds & Smuts Hives   Shortness of breath   Sulfa Antibiotics    Other reaction(s): UNSPECIFIED      Medication List        Accurate as of 02/13/18  9:56 AM. Always use your most recent med list.          acetaminophen 325 MG tablet Commonly known as:   TYLENOL Take by mouth.   albuterol 108 (90 Base) MCG/ACT inhaler Commonly known as:  PROVENTIL HFA;VENTOLIN HFA Inhale into the lungs.   budesonide-formoterol 160-4.5 MCG/ACT inhaler Commonly known as:  SYMBICORT Two puffs every 12 hours to prevent cough or wheeze. Rinse, gargle and spit after use.   cetirizine 10 MG tablet Commonly known as:  ZYRTEC Take 10 mg by mouth daily. Takes 1 tablet at night.   cyclobenzaprine 10 MG tablet Commonly known as:  FLEXERIL Take 10 mg by mouth as needed for muscle spasms.   EPINEPHrine 0.3 mg/0.3 mL Soaj injection Commonly known as:  EPI-PEN Use as directed for severe allergic reactions   fluticasone 50 MCG/ACT nasal spray Commonly known as:  FLONASE   ibuprofen 800 MG tablet Commonly known as:  ADVIL,MOTRIN Take 800 mg by mouth as needed.   IRON PO Take by mouth.   levocetirizine 5 MG tablet Commonly known as:  XYZAL TAKE 1 TABLET BY MOUTH EVERY DAY IN THE EVENING   MIRALAX PO Take by mouth.   montelukast 10 MG tablet Commonly known as:  SINGULAIR Take 1 tablet (10 mg total) by mouth at bedtime.   pantoprazole 40 MG tablet Commonly known as:  PROTONIX TAKE 1 TABLET BY MOUTH ONCE A DAY FOR GERD   PATADAY 0.2 % Soln Generic drug:  Olopatadine HCl ONE DROP EACH EYE ONCE A DAY FOR ITCHY EYES  QVAR REDIHALER 80 MCG/ACT inhaler Generic drug:  beclomethasone PREPARE STUDY-QVAR IN ADDITION to daily maintenance meds. Take QVAR 1 puff for EACH puff of beta-agonist or 5 puffs with each nebulizer use   ranitidine 150 MG tablet Commonly known as:  ZANTAC One tablet twice a day for reflux   Simethicone 125 MG Caps Take by mouth.   traMADol 50 MG tablet Commonly known as:  ULTRAM Take by mouth as needed.       Allergies:  Allergies  Allergen Reactions  . Amoxicillin Anaphylaxis  . Other Other (See Comments) and Hives    Tree nuts, anaphylaxis and migraine headache migranes  . Peanut-Containing Drug Products  Anaphylaxis  . Penicillins Anaphylaxis  . Wheat Bran   . Latex Hives  . Prednisone Other (See Comments)    "Makes me numb and tingly" increases eye pressure.   . Ciprofloxacin     Anaphylaxis   . Ciprofloxacin-Hydrocortisone   . Corn-Containing Products   . Molds & Smuts Hives    Shortness of breath  . Sulfa Antibiotics     Other reaction(s): UNSPECIFIED    Past Medical History, Surgical history, Social history, and Family History were reviewed and updated.  Review of Systems: All other 10 point review of systems is negative.   Physical Exam:  weight is 129 lb 1.9 oz (58.6 kg). Her oral temperature is 98.1 F (36.7 C). Her blood pressure is 127/83 and her pulse is 77. Her respiration is 19 and oxygen saturation is 100%.   Wt Readings from Last 3 Encounters:  02/13/18 129 lb 1.9 oz (58.6 kg)  12/18/17 132 lb (59.9 kg)  10/23/17 137 lb (62.1 kg)    Ocular: Sclerae unicteric, pupils equal, round and reactive to light Ear-nose-throat: Oropharynx clear, dentition fair Lymphatic: No cervical, supraclavicular or axillary adenopathy Lungs no rales or rhonchi, good excursion bilaterally Heart regular rate and rhythm, no murmur appreciated Abd soft, nontender, positive bowel sounds, no liver or spleen tip palpated on exam, no fluid wave  MSK no focal spinal tenderness, no joint edema Neuro: non-focal, well-oriented, appropriate affect Breasts: Deferred   Lab Results  Component Value Date   WBC 2.9 (L) 02/13/2018   HGB 12.7 02/13/2018   HCT 38.6 02/13/2018   MCV 95.3 02/13/2018   PLT 281 02/13/2018   Lab Results  Component Value Date   FERRITIN 186 12/18/2017   IRON 112 12/18/2017   TIBC 264 12/18/2017   UIBC 151 12/18/2017   IRONPCTSAT 43 12/18/2017   Lab Results  Component Value Date   RETICCTPCT 0.9 12/18/2017   RBC 4.05 02/13/2018   No results found for: KPAFRELGTCHN, LAMBDASER, KAPLAMBRATIO No results found for: IGGSERUM, IGA, IGMSERUM No results found for:  Ronnald Ramp, A1GS, A2GS, Tillman Sers, SPEI   Chemistry      Component Value Date/Time   NA 137 09/07/2017 0857   K 3.8 09/07/2017 0857   CL 103 09/07/2017 0857   CO2 27 09/07/2017 0857   BUN 5 (L) 09/07/2017 0857   CREATININE 0.98 09/07/2017 0857      Component Value Date/Time   CALCIUM 9.6 09/07/2017 0857   ALKPHOS 52 09/07/2017 0857   AST 12 09/07/2017 0857   ALT 11 09/07/2017 0857   BILITOT 0.4 09/07/2017 0857      Impression and Plan: Ms. Marsland is a very pleasant 47 yo African American female with iron deficiency anemia secondary to heavy cycles and malabsorption. She had her fibroids removed in August and seems  to be healing nicely.  She is symptomatic as mentioned above. We will see what her iron studies show and bring her back in for infusion if needed.  We will go ahead and plan to see her back in another 2 months for follow-up.  She will contact our office with any questions or concerns. We can certainly see her sooner if need be.   Laverna Peace, NP 9/24/20199:56 AM

## 2018-02-14 DIAGNOSIS — Z130 Encounter for screening for diseases of the blood and blood-forming organs and certain disorders involving the immune mechanism: Secondary | ICD-10-CM | POA: Diagnosis not present

## 2018-02-14 DIAGNOSIS — Z09 Encounter for follow-up examination after completed treatment for conditions other than malignant neoplasm: Secondary | ICD-10-CM | POA: Diagnosis not present

## 2018-02-14 LAB — IRON AND TIBC
Iron: 106 ug/dL (ref 41–142)
Saturation Ratios: 38 % (ref 21–57)
TIBC: 281 ug/dL (ref 236–444)
UIBC: 176 ug/dL

## 2018-02-14 LAB — FERRITIN: Ferritin: 102 ng/mL (ref 11–307)

## 2018-02-15 ENCOUNTER — Telehealth: Payer: Self-pay | Admitting: Family Medicine

## 2018-02-15 DIAGNOSIS — E042 Nontoxic multinodular goiter: Secondary | ICD-10-CM | POA: Insufficient documentation

## 2018-02-15 DIAGNOSIS — Z8639 Personal history of other endocrine, nutritional and metabolic disease: Secondary | ICD-10-CM | POA: Insufficient documentation

## 2018-02-15 NOTE — Telephone Encounter (Signed)
I reported to this patient that, after careful review of her chart with Dr. Shaune Leeks, Ragwitek tablet may not be the best choice for her due to her reactions with prior allergen immunotherapy at [facility] in [place]. She reports she stopped these shots due to extreme fatigue and large local reactions. I am sending allergen avoidance measures via mail.

## 2018-03-15 ENCOUNTER — Other Ambulatory Visit: Payer: Self-pay | Admitting: *Deleted

## 2018-03-15 MED ORDER — ALBUTEROL SULFATE HFA 108 (90 BASE) MCG/ACT IN AERS: 2.0000 | INHALATION_SPRAY | RESPIRATORY_TRACT | 2 refills | Status: DC | PRN

## 2018-03-20 ENCOUNTER — Ambulatory Visit: Payer: PPO | Admitting: Family Medicine

## 2018-04-04 DIAGNOSIS — M542 Cervicalgia: Secondary | ICD-10-CM | POA: Diagnosis not present

## 2018-04-04 DIAGNOSIS — M545 Low back pain: Secondary | ICD-10-CM | POA: Diagnosis not present

## 2018-04-05 DIAGNOSIS — Z8639 Personal history of other endocrine, nutritional and metabolic disease: Secondary | ICD-10-CM | POA: Diagnosis not present

## 2018-04-05 DIAGNOSIS — R7303 Prediabetes: Secondary | ICD-10-CM | POA: Diagnosis not present

## 2018-04-05 DIAGNOSIS — F439 Reaction to severe stress, unspecified: Secondary | ICD-10-CM | POA: Diagnosis not present

## 2018-04-05 DIAGNOSIS — F332 Major depressive disorder, recurrent severe without psychotic features: Secondary | ICD-10-CM | POA: Diagnosis not present

## 2018-04-05 DIAGNOSIS — D508 Other iron deficiency anemias: Secondary | ICD-10-CM | POA: Diagnosis not present

## 2018-04-05 DIAGNOSIS — F419 Anxiety disorder, unspecified: Secondary | ICD-10-CM | POA: Diagnosis not present

## 2018-04-08 ENCOUNTER — Other Ambulatory Visit: Payer: Self-pay | Admitting: Pediatrics

## 2018-04-09 ENCOUNTER — Other Ambulatory Visit: Payer: Self-pay | Admitting: Allergy

## 2018-04-09 MED ORDER — MONTELUKAST SODIUM 10 MG PO TABS: 10.0000 mg | ORAL_TABLET | Freq: Every day | ORAL | 0 refills | Status: DC

## 2018-04-16 ENCOUNTER — Inpatient Hospital Stay: Payer: PPO | Attending: Family | Admitting: Family

## 2018-04-16 ENCOUNTER — Other Ambulatory Visit: Payer: Self-pay

## 2018-04-16 ENCOUNTER — Inpatient Hospital Stay: Payer: PPO

## 2018-04-16 VITALS — BP 123/79 | HR 80 | Temp 97.8°F | Resp 20 | Wt 130.2 lb

## 2018-04-16 DIAGNOSIS — K909 Intestinal malabsorption, unspecified: Secondary | ICD-10-CM | POA: Diagnosis not present

## 2018-04-16 DIAGNOSIS — D508 Other iron deficiency anemias: Secondary | ICD-10-CM | POA: Diagnosis not present

## 2018-04-16 DIAGNOSIS — D500 Iron deficiency anemia secondary to blood loss (chronic): Secondary | ICD-10-CM

## 2018-04-16 DIAGNOSIS — N921 Excessive and frequent menstruation with irregular cycle: Secondary | ICD-10-CM

## 2018-04-16 DIAGNOSIS — N920 Excessive and frequent menstruation with regular cycle: Secondary | ICD-10-CM | POA: Insufficient documentation

## 2018-04-16 LAB — CBC WITH DIFFERENTIAL (CANCER CENTER ONLY)
Abs Immature Granulocytes: 0 10*3/uL (ref 0.00–0.07)
Basophils Absolute: 0 10*3/uL (ref 0.0–0.1)
Basophils Relative: 1 %
Eosinophils Absolute: 0.1 10*3/uL (ref 0.0–0.5)
Eosinophils Relative: 3 %
HCT: 41 % (ref 36.0–46.0)
Hemoglobin: 13.4 g/dL (ref 12.0–15.0)
Immature Granulocytes: 0 %
Lymphocytes Relative: 46 %
Lymphs Abs: 1.7 10*3/uL (ref 0.7–4.0)
MCH: 31.2 pg (ref 26.0–34.0)
MCHC: 32.7 g/dL (ref 30.0–36.0)
MCV: 95.3 fL (ref 80.0–100.0)
Monocytes Absolute: 0.2 10*3/uL (ref 0.1–1.0)
Monocytes Relative: 5 %
Neutro Abs: 1.6 10*3/uL — ABNORMAL LOW (ref 1.7–7.7)
Neutrophils Relative %: 45 %
Platelet Count: 251 10*3/uL (ref 150–400)
RBC: 4.3 MIL/uL (ref 3.87–5.11)
RDW: 12.8 % (ref 11.5–15.5)
WBC Count: 3.6 10*3/uL — ABNORMAL LOW (ref 4.0–10.5)
nRBC: 0 % (ref 0.0–0.2)

## 2018-04-16 LAB — RETICULOCYTES
Immature Retic Fract: 7.7 % (ref 2.3–15.9)
RBC.: 4.3 MIL/uL (ref 3.87–5.11)
Retic Count, Absolute: 42.6 10*3/uL (ref 19.0–186.0)
Retic Ct Pct: 1 % (ref 0.4–3.1)

## 2018-04-16 LAB — FERRITIN: Ferritin: 50 ng/mL (ref 11–307)

## 2018-04-16 LAB — IRON AND TIBC
Iron: 108 ug/dL (ref 41–142)
Saturation Ratios: 29 % (ref 21–57)
TIBC: 376 ug/dL (ref 236–444)
UIBC: 268 ug/dL (ref 120–384)

## 2018-04-16 NOTE — Progress Notes (Signed)
Hematology and Oncology Follow Up Visit  Angela Jimenez [id number] 05-03-1971 47 y.o. 04/16/2018   Principle Diagnosis:  Iron deficiency anemia secondary to malabsorptionand heavy cycles  Current Therapy:   IV iron as indicated    Interim History:  Angela Jimenez is here today for follow-up. She has been stressed lately due to several deaths in her family. She has also been travelling to visit colleges with her daughter.  She is feeling fatigued and has noted chills and SOB with exertion. No fever, n/v, cough, rash, chest pain, palpitations,  or changes in bowel or bladder habits.  She has occasional abdominal pain since her myomectomy surgery in August.  She has only had some spotting off and on with the birth control.  No swelling in her extremities. She has generalized numbness and tingling that waxes and wanes. She has seen her orthopedist and PCP regarding this and they both felt it was related to stress. She did not want to start Cymbalta. She prefers to avoid medication if possible. She has also seen a rheumatologist.  No lymphadenopathy noted on exam.  She has a good appetite and is staying well hydrated. Her weight is stable.   ECOG Performance Status: 1 - Symptomatic but completely ambulatory  Medications:  Allergies as of 04/16/2018      Reactions   Amoxicillin Anaphylaxis   Other Other (See Comments), Hives   Tree nuts, anaphylaxis and migraine headache migranes   Peanut-containing Drug Products Anaphylaxis   Penicillins Anaphylaxis   Wheat Bran    Latex Hives   Prednisone Other (See Comments)   "Makes me numb and tingly" increases eye pressure.    Ciprofloxacin    Anaphylaxis   Ciprofloxacin-hydrocortisone    Corn-containing Products    Molds & Smuts Hives   Shortness of breath   Sulfa Antibiotics    Other reaction(s): UNSPECIFIED      Medication List        Accurate as of 04/16/18  9:44 AM. Always use your most recent med list.            acetaminophen 325 MG tablet Commonly known as:  TYLENOL Take by mouth.   albuterol 108 (90 Base) MCG/ACT inhaler Commonly known as:  PROVENTIL HFA;VENTOLIN HFA Inhale into the lungs.   budesonide-formoterol 160-4.5 MCG/ACT inhaler Commonly known as:  SYMBICORT Two puffs every 12 hours to prevent cough or wheeze. Rinse, gargle and spit after use.   cyclobenzaprine 10 MG tablet Commonly known as:  FLEXERIL Take 10 mg by mouth as needed for muscle spasms.   EPINEPHrine 0.3 mg/0.3 mL Soaj injection Commonly known as:  EPI-PEN Use as directed for severe allergic reactions   fluticasone 50 MCG/ACT nasal spray Commonly known as:  FLONASE   IRON PO Take by mouth.   LESSINA-28 0.1-20 MG-MCG tablet Generic drug:  levonorgestrel-ethinyl estradiol TK 1 T PO QD   levocetirizine 5 MG tablet Commonly known as:  XYZAL TAKE 1 TABLET BY MOUTH EVERY EVENING   MIRALAX PO Take by mouth.   montelukast 10 MG tablet Commonly known as:  SINGULAIR Take 1 tablet (10 mg total) by mouth at bedtime.   nabumetone 500 MG tablet Commonly known as:  RELAFEN Take 500 mg by mouth 2 (two) times daily.   PATADAY 0.2 % Soln Generic drug:  Olopatadine HCl ONE DROP EACH EYE ONCE A DAY FOR ITCHY EYES   QVAR REDIHALER 80 MCG/ACT inhaler Generic drug:  beclomethasone PREPARE STUDY-QVAR IN ADDITION to daily maintenance meds. Take  QVAR 1 puff for EACH puff of beta-agonist or 5 puffs with each nebulizer use   REPLESTA 1.25 MG (50000 UT) Wafr Generic drug:  Cholecalciferol TAKE 1 WAFER BY MOUTH ONCE WEEKLY FOR 6 MONTHS AS DIRECTED   traMADol 50 MG tablet Commonly known as:  ULTRAM Take by mouth as needed.       Allergies:  Allergies  Allergen Reactions  . Amoxicillin Anaphylaxis  . Other Other (See Comments) and Hives    Tree nuts, anaphylaxis and migraine headache migranes  . Peanut-Containing Drug Products Anaphylaxis  . Penicillins Anaphylaxis  . Wheat Bran   . Latex Hives  .  Prednisone Other (See Comments)    "Makes me numb and tingly" increases eye pressure.   . Ciprofloxacin     Anaphylaxis   . Ciprofloxacin-Hydrocortisone   . Corn-Containing Products   . Molds & Smuts Hives    Shortness of breath  . Sulfa Antibiotics     Other reaction(s): UNSPECIFIED    Past Medical History, Surgical history, Social history, and Family History were reviewed and updated.  Review of Systems: All other 10 point review of systems is negative.   Physical Exam:  weight is 130 lb 4 oz (59.1 kg). Her oral temperature is 97.8 F (36.6 C). Her blood pressure is 123/79 and her pulse is 80. Her respiration is 20 and oxygen saturation is 100%.   Wt Readings from Last 3 Encounters:  04/16/18 130 lb 4 oz (59.1 kg)  02/13/18 129 lb 1.9 oz (58.6 kg)  12/18/17 132 lb (59.9 kg)    Ocular: Sclerae unicteric, pupils equal, round and reactive to light Ear-nose-throat: Oropharynx clear, dentition fair Lymphatic: No cervical, supraclavicular or axillary adenopathy Lungs no rales or rhonchi, good excursion bilaterally Heart regular rate and rhythm, no murmur appreciated Abd soft, nontender, positive bowel sounds, no liver or spleen tip palpated on exam, no fluid wave  MSK no focal spinal tenderness, no joint edema Neuro: non-focal, well-oriented, appropriate affect Breasts: Deferred   Lab Results  Component Value Date   WBC 3.6 (L) 04/16/2018   HGB 13.4 04/16/2018   HCT 41.0 04/16/2018   MCV 95.3 04/16/2018   PLT 251 04/16/2018   Lab Results  Component Value Date   FERRITIN 102 02/13/2018   IRON 106 02/13/2018   TIBC 281 02/13/2018   UIBC 176 02/13/2018   IRONPCTSAT 38 02/13/2018   Lab Results  Component Value Date   RETICCTPCT 1.0 04/16/2018   RBC 4.30 04/16/2018   RBC 4.30 04/16/2018   No results found for: KPAFRELGTCHN, LAMBDASER, KAPLAMBRATIO No results found for: IGGSERUM, IGA, IGMSERUM No results found for: Ronnald Ramp, A1GS, A2GS, Tillman Sers, SPEI   Chemistry      Component Value Date/Time   NA 137 09/07/2017 0857   K 3.8 09/07/2017 0857   CL 103 09/07/2017 0857   CO2 27 09/07/2017 0857   BUN 5 (L) 09/07/2017 0857   CREATININE 0.98 09/07/2017 0857      Component Value Date/Time   CALCIUM 9.6 09/07/2017 0857   ALKPHOS 52 09/07/2017 0857   AST 12 09/07/2017 0857   ALT 11 09/07/2017 0857   BILITOT 0.4 09/07/2017 0857       Impression and Plan: Ms. Woodroof is a very pleasant 47 yo African American female with iron deficiency anemia secondary to heavy cycles and malabsorption. Her cycles has reduced to intermittent spotting since surgery.  She is symptomatic as mentioned above. We will see what her iron  studies show and bring her back in for infusion if needed.   We will plan to see her in another 3 months.  She will contact our office with any questions or concerns. We can certainly see her sooner if need be.   Laverna Peace, NP 11/25/20199:44 AM

## 2018-04-27 ENCOUNTER — Other Ambulatory Visit: Payer: Self-pay | Admitting: Pediatrics

## 2018-04-30 DIAGNOSIS — G8929 Other chronic pain: Secondary | ICD-10-CM | POA: Diagnosis not present

## 2018-04-30 DIAGNOSIS — E559 Vitamin D deficiency, unspecified: Secondary | ICD-10-CM | POA: Diagnosis not present

## 2018-04-30 DIAGNOSIS — R7303 Prediabetes: Secondary | ICD-10-CM | POA: Diagnosis not present

## 2018-04-30 DIAGNOSIS — Z8639 Personal history of other endocrine, nutritional and metabolic disease: Secondary | ICD-10-CM | POA: Diagnosis not present

## 2018-04-30 DIAGNOSIS — D508 Other iron deficiency anemias: Secondary | ICD-10-CM | POA: Diagnosis not present

## 2018-05-10 DIAGNOSIS — N390 Urinary tract infection, site not specified: Secondary | ICD-10-CM | POA: Diagnosis not present

## 2018-05-17 DIAGNOSIS — R300 Dysuria: Secondary | ICD-10-CM | POA: Diagnosis not present

## 2018-05-23 DIAGNOSIS — M6530 Trigger finger, unspecified finger: Secondary | ICD-10-CM

## 2018-05-23 HISTORY — DX: Trigger finger, unspecified finger: M65.30

## 2018-05-28 ENCOUNTER — Ambulatory Visit (INDEPENDENT_AMBULATORY_CARE_PROVIDER_SITE_OTHER): Payer: PPO | Admitting: Family Medicine

## 2018-05-28 ENCOUNTER — Encounter: Payer: Self-pay | Admitting: Family Medicine

## 2018-05-28 ENCOUNTER — Other Ambulatory Visit: Payer: Self-pay | Admitting: Allergy

## 2018-05-28 VITALS — BP 102/70 | HR 86 | Temp 98.1°F | Resp 16 | Ht 62.3 in | Wt 125.4 lb

## 2018-05-28 DIAGNOSIS — T7800XD Anaphylactic reaction due to unspecified food, subsequent encounter: Secondary | ICD-10-CM

## 2018-05-28 DIAGNOSIS — T63441A Toxic effect of venom of bees, accidental (unintentional), initial encounter: Secondary | ICD-10-CM | POA: Insufficient documentation

## 2018-05-28 DIAGNOSIS — J4541 Moderate persistent asthma with (acute) exacerbation: Secondary | ICD-10-CM | POA: Diagnosis not present

## 2018-05-28 DIAGNOSIS — J050 Acute obstructive laryngitis [croup]: Secondary | ICD-10-CM | POA: Diagnosis not present

## 2018-05-28 DIAGNOSIS — T63441D Toxic effect of venom of bees, accidental (unintentional), subsequent encounter: Secondary | ICD-10-CM

## 2018-05-28 MED ORDER — MONTELUKAST SODIUM 10 MG PO TABS: 10.0000 mg | ORAL_TABLET | Freq: Every day | ORAL | 5 refills | Status: DC

## 2018-05-28 MED ORDER — MONTELUKAST SODIUM 10 MG PO TABS: 10.0000 mg | ORAL_TABLET | Freq: Every day | ORAL | 0 refills | Status: DC

## 2018-05-28 NOTE — Patient Instructions (Addendum)
Levocetirizine 5 mg-take 1 tablet once a day for runny nose Nasal saline irrigations followed by Nasacort 1 spray per nostril twice a day Montelukast 10 mg-take 1 tablet once a day to prevent coughing or wheezing Resume Symbicort 160-2 puffs twice a day with a spacer.  Rinse gargle and spit out after use Continue on your other medications Avoid almond milk, peanuts, tree nuts, wheat and corn products.  If you have an allergic reaction take Benadryl 50 mg every 4 hours and if you have life-threatening symptoms inject with EpiPen 0.3 mg We will let you know the results of your blood work for allergy to insect stings to determine if you can use bee pollen Continue on your other medications If your symptoms get worse, add prednisone 10 mg twice a day for 4 days, 10 mg on the fifth day.  At this time I do not feel that you need an antibiotic

## 2018-05-28 NOTE — Progress Notes (Signed)
[place] [address] Dept: [phone]  FOLLOW UP NOTE  Patient ID: Angela Jimenez, female    DOB: [birth year]  Age: 48 y.o. MRN: [id number] Date of Office Visit: 05/28/2018  Assessment  Chief Complaint: Cough (alot of mucus. laryngitis)  HPI Angela Jimenez presents for follow-up of asthma and allergic rhinitis..  She developed a cough 2 days ago and a slight sore throat.  Her cough is better.  She stopped using Symbicort a few months ago.  She is on montelukast 10 mg once a day.  Her nasal symptoms are under control with the use of Xyzal 5 mg once a day.  She is not using Nasacort on a daily basis.  She continues to avoid almond milk, peanuts, tree nuts wheat and corn products.  She did not start oral immunotherapy to peanuts   Drug Allergies:  Allergies  Allergen Reactions  . Amoxicillin Anaphylaxis  . Other Other (See Comments) and Hives    Tree nuts, anaphylaxis and migraine headache migranes migranes  . Peanut-Containing Drug Products Anaphylaxis  . Penicillins Anaphylaxis  . Wheat Bran   . Latex Hives  . Prednisone Other (See Comments)    "Makes me numb and tingly" increases eye pressure.   . Ciprofloxacin     Anaphylaxis   . Ciprofloxacin-Hydrocortisone   . Corn-Containing Products   . Molds & Smuts Hives    Shortness of breath  . Sulfa Antibiotics     Other reaction(s): UNSPECIFIED    Physical Exam: BP 102/70 (BP Location: Right Arm, Patient Position: Sitting, Cuff Size: Normal)   Pulse 86   Temp 98.1 F (36.7 C) (Oral)   Resp 16   Ht 5' 2.3" (1.582 m)   Wt 125 lb 6.4 oz (56.9 kg)   SpO2 97%   BMI 22.72 kg/m    Physical Exam Vitals signs reviewed.  Constitutional:      Appearance: Normal appearance. She is normal weight.  HENT:     Head:     Comments: Eyes normal.  Ears normal.  Nose mild swelling of the nasal  turbinates.  Pharynx normal. Neck:     Musculoskeletal: Neck supple.  Cardiovascular:     Comments: S1-S2 normal no  murmurs Pulmonary:     Comments: Clear to percussion and auscultation Lymphadenopathy:     Cervical: No cervical adenopathy.  Neurological:     General: No focal deficit present.     Mental Status: She is alert and oriented to person, place, and time.  Psychiatric:        Mood and Affect: Mood normal.        Behavior: Behavior normal.        Thought Content: Thought content normal.        Judgment: Judgment normal.     Diagnostics: FVC 2.61 L FEV1 2.16 L.  Predicted FVC 2.72 L predicted FEV1 2.20 L-the spirometry is in the normal range  Assessment and Plan: 1. Anaphylactic reaction to bee sting, accidental or unintentional, subsequent encounter   2. Moderate persistent asthma with acute exacerbation   3. Anaphylactic shock due to food, subsequent encounter   4. Croup     Meds ordered this encounter  Medications  . montelukast (SINGULAIR) 10 MG tablet    Sig: Take 1 tablet (10 mg total) by mouth at bedtime.    Dispense:  30 tablet    Refill:  5    Patient Instructions  Levocetirizine 5 mg-take 1 tablet once a day  for runny nose Nasal saline irrigations followed by Nasacort 1 spray per nostril twice a day Montelukast 10 mg-take 1 tablet once a day to prevent coughing or wheezing Resume Symbicort 160-2 puffs twice a day with a spacer.  Rinse gargle and spit out after use Continue on your other medications Avoid almond milk, peanuts, tree nuts, wheat and corn products.  If you have an allergic reaction take Benadryl 50 mg every 4 hours and if you have life-threatening symptoms inject with EpiPen 0.3 mg We will let you know the results of your blood work for allergy to insect stings to determine if you can use bee pollen Continue on your other medications If your symptoms get worse, add prednisone 10 mg twice a day for 4 days, 10 mg on the fifth day.  At this time I do not feel that you need an antibiotic   Return in about 6 months (around 11/26/2018).    Thank you for the  opportunity to care for this patient.  Please do not hesitate to contact me with questions.  Penne Lash, M.D.  [facility] [address] [place],  [address] [phone]

## 2018-05-30 ENCOUNTER — Telehealth: Payer: Self-pay | Admitting: Allergy

## 2018-05-30 ENCOUNTER — Other Ambulatory Visit: Payer: Self-pay | Admitting: Allergy

## 2018-05-30 MED ORDER — BENZONATATE 100 MG PO CAPS: 100.0000 mg | ORAL_CAPSULE | Freq: Three times a day (TID) | ORAL | 1 refills | Status: DC | PRN

## 2018-05-30 NOTE — Telephone Encounter (Signed)
Pt. Seen on Monday, no better patient worse. Coughing a lot at night and some in day. Patient said nose was bleeding . She is really hoarse Sinuse is hurting.Drainage is bad.yellow. nose running a lot No fever.Patient said she was achy she thinks from coughing so much.. Can you call in some cough syrup?Marland Kitchen Should patient be seen ? Please advise.Thank you

## 2018-05-30 NOTE — Telephone Encounter (Signed)
Faxed in prescription and informed patient.Told her if she didn't get better to call us back.

## 2018-06-01 ENCOUNTER — Encounter: Payer: Self-pay | Admitting: Allergy

## 2018-06-01 ENCOUNTER — Telehealth: Payer: Self-pay

## 2018-06-01 MED ORDER — GUAIFENESIN-CODEINE 100-10 MG/5ML PO SOLN: 5.0000 mL | Freq: Three times a day (TID) | ORAL | 0 refills | Status: DC | PRN

## 2018-06-01 NOTE — Telephone Encounter (Signed)
Sent in robitussin with codeine. Take 93ml three times a day as needed for coughing. This may make her drowsy. Recommend not operating any heavy machinery including driving a car. If not better by next week may need to be reevaluated.

## 2018-06-01 NOTE — Addendum Note (Signed)
Addended by: Garnet Sierras on: 06/01/2018 02:19 PM   Modules accepted: Orders

## 2018-06-01 NOTE — Telephone Encounter (Signed)
Pt called stating she is still not feeling any better, she was seen Monday 05/28/2018 by Lelon Frohlich. She states her nose is bleeding and she has a terrible cough. She was given prednisone, I told her to take all the pack. She would like to know if she can have a cough medicine called called in.  [phone] Mobile

## 2018-06-01 NOTE — Telephone Encounter (Signed)
From 1/8 phone encounter looks like they sent in [place]. Did she pick it up and taking it?   Nose Bleeds: Nosebleeds are very common.  Site of the bleeding is typically on the septum or at the very front of the nose.  Some of the more common causes are from trauma, inflammation or medication induced. Preventative treatment: 1.  Apply saline nasal gel in each nostril twice a day for 2 weeks to allow the nasal mucosa to heal 2.  Consider using a humidifier in the winter 3. Try to keep your blood pressure as normal as possible (120/80)

## 2018-06-01 NOTE — Telephone Encounter (Signed)
Informed pt of what dr Maudie Mercury has stated. Pt stated understanding

## 2018-06-01 NOTE — Telephone Encounter (Signed)
Pt states that tessalon perles doesn't work for her. Is there anything else you could prescribe?

## 2018-06-03 DIAGNOSIS — J069 Acute upper respiratory infection, unspecified: Secondary | ICD-10-CM | POA: Diagnosis not present

## 2018-06-04 ENCOUNTER — Other Ambulatory Visit: Payer: Self-pay

## 2018-06-04 ENCOUNTER — Encounter: Payer: Self-pay | Admitting: Pediatrics

## 2018-06-04 ENCOUNTER — Ambulatory Visit (INDEPENDENT_AMBULATORY_CARE_PROVIDER_SITE_OTHER): Payer: PPO | Admitting: Pediatrics

## 2018-06-04 ENCOUNTER — Telehealth: Payer: Self-pay | Admitting: Pediatrics

## 2018-06-04 VITALS — BP 116/84 | HR 82 | Temp 98.1°F | Resp 16

## 2018-06-04 DIAGNOSIS — J4540 Moderate persistent asthma, uncomplicated: Secondary | ICD-10-CM | POA: Diagnosis not present

## 2018-06-04 DIAGNOSIS — Z86018 Personal history of other benign neoplasm: Secondary | ICD-10-CM | POA: Insufficient documentation

## 2018-06-04 DIAGNOSIS — T7800XD Anaphylactic reaction due to unspecified food, subsequent encounter: Secondary | ICD-10-CM | POA: Diagnosis not present

## 2018-06-04 DIAGNOSIS — J3089 Other allergic rhinitis: Secondary | ICD-10-CM

## 2018-06-04 MED ORDER — DEXTROMETHORPHAN POLISTIREX ER 30 MG/5ML PO SUER: ORAL | 0 refills | Status: DC

## 2018-06-04 MED ORDER — AZITHROMYCIN 250 MG PO TABS: ORAL_TABLET | ORAL | 0 refills | Status: DC

## 2018-06-04 NOTE — Telephone Encounter (Signed)
PT was see last Monday 05/28/18 with Dr Shaune Leeks for sinus infections. She's calling back because of persistent cough and irritated throat. Went to walk in clinic yesterday, was told to let sickness run its course. PT believes she needs more meds. Schedule same day OV for 2:45 w/Dr Shaune Leeks.

## 2018-06-04 NOTE — Progress Notes (Signed)
Angela Jimenez [id number] Dept: [phone]  FOLLOW UP NOTE  Patient ID: Angela Jimenez, female    DOB: 12-19-1970  Age: 48 y.o. MRN: [id number] Date of Office Visit: 06/04/2018  Assessment  Chief Complaint: Cough  HPI [name] presents for follow-up of asthma and allergic rhinitis.  She had a cold about 2 weeks ago.  The use of prednisone 10 mg twice a day for 4 days 10 mg on the fifth day did not result in significant improvement.  Yesterday she had a low-grade fever.  She is bringing up a discolored postnasal drainage.  She did not get a flu shot.  She is on montelukast 10 mg once a day and Symbicort 160-2 puffs every 12 hours..  She did not do blood work regarding insect allergy to honeybee.  She wants to consider taking bee pollen.  She continues to avoid almond milk, peanuts, tree nuts, wheat and corn products   Drug Allergies:  Allergies  Allergen Reactions  . Amoxicillin Anaphylaxis  . Other Other (See Comments) and Hives    Tree nuts, anaphylaxis and migraine headache migranes migranes  . Peanut-Containing Drug Products Anaphylaxis  . Penicillins Anaphylaxis  . Wheat Bran   . Latex Hives  . Prednisone Other (See Comments)    "Makes me numb and tingly" increases eye pressure.   . Ciprofloxacin     Anaphylaxis   . Ciprofloxacin-Hydrocortisone   . Corn-Containing Products   . Molds & Smuts Hives    Shortness of breath  . Sulfa Antibiotics     Other reaction(s): UNSPECIFIED    Physical Exam: BP 116/84   Pulse 82   Temp 98.1 F (36.7 C) (Oral)   Resp 16   SpO2 97%    Physical Exam Constitutional:      Appearance: Normal appearance. She is normal weight.  HENT:     Head:     Comments: Eyes normal.  Ears normal.  Nose mild swelling of nasal turbinates turbinates.  Pharynx normal except for yellow postnasal drainage Neck:     Musculoskeletal: Neck supple.  Cardiovascular:     Comments: S1-S2 normal no murmur Pulmonary:     Comments:  Clear to percussion and auscultation Lymphadenopathy:     Cervical: No cervical adenopathy.  Neurological:     General: No focal deficit present.     Mental Status: She is alert and oriented to person, place, and time.  Psychiatric:        Mood and Affect: Mood normal.        Behavior: Behavior normal.        Thought Content: Thought content normal.        Judgment: Judgment normal.     Diagnostics: FVC 2.56 L FEV1 2.10 L.  Predicted FVC 2.72 L predicted FEV1 2.20 L- the spirometry is in the normal range  Assessment and Plan: 1. Moderate persistent asthma without complication   2. Anaphylactic shock due to food, subsequent encounter   3. History of uterine leiomyoma   4. Other allergic rhinitis     Meds ordered this encounter  Medications  . azithromycin (ZITHROMAX) 250 MG tablet    Sig: Take 2 tablets tonight then 1 tablet at night for the next 4 nights    Dispense:  6 each    Refill:  0  . dextromethorphan (DELSYM) 30 MG/5ML liquid    Sig: Take 2 teaspoonfuls every 12 hours if needed for cough    Dispense:  600  mL    Refill:  0    Patient Instructions  Levocetirizine 5 mg-take 1 tablet once a day for runny nose Nasal saline irrigations at night followed by Nasacort 1 spray per nostril twice a day Montelukast 10 mg-take 1 tablet once a day to prevent coughing or wheezing Symbicort 160-2 puffs  every 12 hours to prevent coughing or wheezing Avoid almond milk, peanuts, tree nuts, wheat and corn products.  If you have an allergic reaction take Benadryl 50 mg every 4 hours and if you have life-threatening symptoms inject with EpiPen 0.3 mg   Delsym 2 teaspoonfuls every 12 hours if needed for cough Zithromax 250 mg-take 2 tablets tonight, then 1 tablet at night for the next 4 nights Continuing your other medications Call us if you are not doing well on this treatment plan If the cough does not start improving , I recommend doing a chest x-ray   Return in about 3 months  (around 09/03/2018).    Thank you for the opportunity to care for this patient.  Please do not hesitate to contact me with questions.  Penne Lash, M.D.  [facility] [address] [place], [place] [address] [phone]

## 2018-06-04 NOTE — Telephone Encounter (Signed)
Pt being seen in clinic currently

## 2018-06-04 NOTE — Patient Instructions (Addendum)
Levocetirizine 5 mg-take 1 tablet once a day for runny nose Nasal saline irrigations at night followed by Nasacort 1 spray per nostril twice a day Montelukast 10 mg-take 1 tablet once a day to prevent coughing or wheezing Symbicort 160-2 puffs  every 12 hours to prevent coughing or wheezing Avoid almond milk, peanuts, tree nuts, wheat and corn products.  If you have an allergic reaction take Benadryl 50 mg every 4 hours and if you have life-threatening symptoms inject with EpiPen 0.3 mg   Delsym 2 teaspoonfuls every 12 hours if needed for cough Zithromax 250 mg-take 2 tablets tonight, then 1 tablet at night for the next 4 nights Continuing your other medications Call us if you are not doing well on this treatment plan If the cough does not start improving , I recommend doing a chest x-ray

## 2018-06-04 NOTE — Patient Outreach (Signed)
[place] [facility]) Care Management  06/04/2018  Angela Jimenez 1970-12-26 [id number]   Referral Date:06/04/2018 Referral Source: Nurseline Referral Reason: sinus pressure, cough, hoarse, and bodyaches   Outreach Attempt: no answer. HIPAA compliant voice message left.   Plan: RN CM will attempt again within 4 business days and send letter.     Jone Baseman, RN, MSN [facility] Care Management Care Management Coordinator Direct Line [phone] Toll Free: [phone]  Fax: [phone]

## 2018-06-05 ENCOUNTER — Other Ambulatory Visit: Payer: Self-pay

## 2018-06-05 NOTE — Patient Outreach (Signed)
[place] [facility]) Care Management  06/05/2018  Angela Jimenez Apr 08, 1971 [id number]   Referral Date:06/04/2018 Referral Source: Nurseline Referral Reason: sinus pressure, cough, hoarse, and bodyaches   Outreach Attempt: spoke with patient.  She states she feels rough.  She says that she went to the walk in clinic but was not given anything as they thought it maybe viral.  She states she went yesterday to her allergy doctor and was given a Z-pak.  She states she has a cough that has caused some pain. Advised patient if she feels that she hurt something with her coughing to notify the doctor.  Also discussed with patient about infection being viral vs infectious and signs that she would need to seek further medical attention.  She verbalized understanding.  Patient declines any further questions or concerns.     Plan: RN CM will close case.  Jone Baseman, RN, MSN [place] Management Care Management Coordinator Direct Line [phone] Cell [phone] Toll Free: [phone]  Fax: [phone]

## 2018-06-13 ENCOUNTER — Ambulatory Visit (INDEPENDENT_AMBULATORY_CARE_PROVIDER_SITE_OTHER): Payer: PPO | Admitting: Family Medicine

## 2018-06-13 ENCOUNTER — Ambulatory Visit (HOSPITAL_BASED_OUTPATIENT_CLINIC_OR_DEPARTMENT_OTHER)
Admission: RE | Admit: 2018-06-13 | Discharge: 2018-06-13 | Disposition: A | Discharge status: Home / Self Care | Payer: PPO | Source: Ambulatory Visit | Attending: Family Medicine | Admitting: Family Medicine

## 2018-06-13 ENCOUNTER — Encounter: Payer: Self-pay | Admitting: Family Medicine

## 2018-06-13 VITALS — BP 102/66 | HR 85 | Temp 98.2°F | Resp 16

## 2018-06-13 DIAGNOSIS — R05 Cough: Secondary | ICD-10-CM | POA: Diagnosis not present

## 2018-06-13 DIAGNOSIS — J0100 Acute maxillary sinusitis, unspecified: Secondary | ICD-10-CM | POA: Diagnosis not present

## 2018-06-13 DIAGNOSIS — J302 Other seasonal allergic rhinitis: Secondary | ICD-10-CM

## 2018-06-13 DIAGNOSIS — J4540 Moderate persistent asthma, uncomplicated: Secondary | ICD-10-CM | POA: Diagnosis not present

## 2018-06-13 DIAGNOSIS — R059 Cough, unspecified: Secondary | ICD-10-CM

## 2018-06-13 DIAGNOSIS — J3089 Other allergic rhinitis: Secondary | ICD-10-CM | POA: Diagnosis not present

## 2018-06-13 DIAGNOSIS — T7800XD Anaphylactic reaction due to unspecified food, subsequent encounter: Secondary | ICD-10-CM | POA: Diagnosis not present

## 2018-06-13 DIAGNOSIS — K219 Gastro-esophageal reflux disease without esophagitis: Secondary | ICD-10-CM | POA: Diagnosis not present

## 2018-06-13 MED ORDER — CLARITHROMYCIN 250 MG PO TABS: 250.0000 mg | ORAL_TABLET | Freq: Two times a day (BID) | ORAL | 0 refills | Status: DC

## 2018-06-13 MED ORDER — DOXYCYCLINE HYCLATE 100 MG PO TABS: 100.0000 mg | ORAL_TABLET | Freq: Two times a day (BID) | ORAL | 0 refills | Status: DC

## 2018-06-13 NOTE — Telephone Encounter (Signed)
Pt was seen 1/13 by Dr. Shaune Leeks and has finished zpak. Pt not better- coughing, drainage, sinus pressure, hoarseness. Wondering if she needs stronger antibiotic or prednisone? She is doing xyzal, montelukast, Nasacort and Delsym. Pt has been dealing with this since 1/6 apt. Please advise.

## 2018-06-13 NOTE — Telephone Encounter (Signed)
May put her on Anne's schedule this afternoon or tomorrow.

## 2018-06-13 NOTE — Patient Instructions (Addendum)
Acute non-recurrent maxillary sinusitis Begin biaxin 250 mg twice a day for 10 days Prednisone 10 mg tablets. Take 2 tablets once a day for 4 day, then take 1 tablet on the 5th day, then stop Continue saline nasal rinses. Use this before medicated nasal sprays Begin Mucinex [phone] mg twice a day and increase fluid as tolerated  Cough Chest xray to help [place] further evaluate the cough. I will call you with results as soon as they are available Continue Delsym 2 teaspoonfuls every 12 hours as needed for cough  Gastroesophageal reflux disease without esophagitis Continue lifestyle modifications Begin famotidine 20 mg twice a day as needed for reflux  Moderate persistent asthma without complication Continue Symbicort 160-2 puffs twice a day with a spacer to prevent cough and wheeze Continue Qvar 80 -2 puffs once a day Continue montelukast 10 mg once a day to prevent cough and wheeze Continue albuterol as needed for cough or wheeze  Seasonal and perennial allergic rhinitis Stop Xyzal for 10 days, while you are taking antibiotics and Mucinex Continue allergen avoidance measures Continue nasal saline rinses as above  Anaphylactic shock due to food, subsequent encounter Continue to avoid almond milk, peanuts, tree nuts, wheat, and corn products. If you are having an allergic reaction take Benadryl 50 mg and if you have life threatening symptoms inject with EpiPen 0.3 mg and call 911  Follow up in 4 weeks or sooner if needed

## 2018-06-13 NOTE — Progress Notes (Addendum)
[place] [address] Dept: [phone]  FOLLOW UP NOTE  Patient ID: MYSTIC LABO, female    DOB: [birth year]  Age: 48 y.o. MRN: [id number] Date of Office Visit: 06/13/2018  Assessment  Chief Complaint: Cough and Sinusitis  HPI Angela Jimenez is a 48 year old female who presents to the clinic for a sick visit. She was last seen in the clinic on 06/04/2018 by Dr. Shaune Leeks for evaluation of cough, discolored mucus, and low grade fever. At that time, she began Delsym cough syrup and azithromycin. At today's visit, she reports she is experiencing sinus pressure and pain under both eyes, dull headache, nasal congestion,and thick discolored mucus with cough. She denies fever, sweats, and chills. She reports the azithromycin helped for a few days and then her symptoms worsened. She reports asthma as moderately well controlled with Symbicort 160-2 puffs twice a day, Qvar 80-2 puffs once a day, montelukast 10 mg once a day, and albuterol once a day. Allergic rhinitis is not well controlled with Flonase 1 spray twice a day, Xyzal once a day, and saline nasal rinses and gel as needed. She continues to avoid almond milk, milk, peanuts, tree nuts, wheat, and corn products. Her current medications are listed in the chart.    Drug Allergies:  Allergies  Allergen Reactions  . Amoxicillin Anaphylaxis  . Other Other (See Comments) and Hives    Tree nuts, anaphylaxis and migraine headache migranes migranes  . Peanut-Containing Drug Products Anaphylaxis  . Penicillins Anaphylaxis  . Wheat Bran   . Latex Hives  . Prednisone Other (See Comments)    "Makes me numb and tingly" increases eye pressure.   . Ciprofloxacin     Anaphylaxis   . Ciprofloxacin-Hydrocortisone   . Corn-Containing Products   . Molds & Smuts Hives    Shortness of breath  . Sulfa Antibiotics     Other reaction(s): UNSPECIFIED    Physical Exam: BP 102/66   Pulse 85   Temp 98.2 F (36.8 C) (Oral)   Resp  16   SpO2 99%    Physical Exam Vitals signs reviewed.  Constitutional:      Appearance: Normal appearance.  HENT:     Head: Normocephalic and atraumatic.     Right Ear: Tympanic membrane normal.     Left Ear: Tympanic membrane normal.     Nose:     Comments: Bilateral nares slightly erythematous with clear nasal drainage noted. Pharynx erythematous with no exudate. Ears normal. Eyes normal.    Mouth/Throat:     Pharynx: Oropharynx is clear.  Eyes:     Conjunctiva/sclera: Conjunctivae normal.  Neck:     Musculoskeletal: Normal range of motion and neck supple.  Cardiovascular:     Rate and Rhythm: Normal rate and regular rhythm.     Heart sounds: Normal heart sounds. No murmur.  Pulmonary:     Effort: Pulmonary effort is normal.     Breath sounds: Normal breath sounds.     Comments: Lungs clear to auscultation Musculoskeletal: Normal range of motion.  Skin:    General: Skin is warm and dry.  Neurological:     Mental Status: She is alert and oriented to person, place, and time.  Psychiatric:        Mood and Affect: Mood normal.        Behavior: Behavior normal.        Thought Content: Thought content normal.        Judgment: Judgment  normal.     Diagnostics: FVC 2.63, FEV1 2.13. Predicted FVC 2.72, predicted FEV1 2.20. Spirometry is within the normal range.   Assessment and Plan: 1. Acute non-recurrent maxillary sinusitis   2. Moderate persistent asthma without complication   3. Seasonal and perennial allergic rhinitis   4. Gastroesophageal reflux disease without esophagitis   5. Anaphylactic shock due to food, subsequent encounter   6. Cough     Meds ordered this encounter  Medications  . DISCONTD: doxycycline (VIBRA-TABS) 100 MG tablet    Sig: Take 1 tablet (100 mg total) by mouth 2 (two) times daily.    Dispense:  20 tablet    Refill:  0  . clarithromycin (BIAXIN) 250 MG tablet    Sig: Take 1 tablet (250 mg total) by mouth 2 (two) times daily.    Dispense:   20 tablet    Refill:  0    Patient Instructions  Acute non-recurrent maxillary sinusitis Begin biaxin 250 mg twice a day for 10 days Prednisone 10 mg tablets. Take 2 tablets once a day for 4 day, then take 1 tablet on the 5th day, then stop Continue saline nasal rinses. Use this before medicated nasal sprays Begin Mucinex [phone] mg twice a day and increase fluid as tolerated  Cough Chest xray to help [place] further evaluate the cough. I will call you with results as soon as they are available Continue Delsym 2 teaspoonfuls every 12 hours as needed for cough  Gastroesophageal reflux disease without esophagitis Continue lifestyle modifications Begin famotidine 20 mg twice a day as needed for reflux  Moderate persistent asthma without complication Continue Symbicort 160-2 puffs twice a day with a spacer to prevent cough and wheeze Continue Qvar 80 -2 puffs once a day Continue montelukast 10 mg once a day to prevent cough and wheeze Continue albuterol as needed for cough or wheeze  Seasonal and perennial allergic rhinitis Stop Xyzal for 10 days, while you are taking antibiotics and Mucinex Continue allergen avoidance measures Continue nasal saline rinses as above  Anaphylactic shock due to food, subsequent encounter Continue to avoid almond milk, peanuts, tree nuts, wheat, and corn products. If you are having an allergic reaction take Benadryl 50 mg and if you have life threatening symptoms inject with EpiPen 0.3 mg and call 911  Follow up in 4 weeks or sooner if needed   Return in about 4 weeks (around 07/11/2018), or if symptoms worsen or fail to improve.    Thank you for the opportunity to care for this patient.  Please do not hesitate to contact me with questions.  Gareth Morgan, FNP [facility] [place]  _________________________________________________  I have provided oversight concerning Angela Jimenez's evaluation and treatment of this patient's health  issues addressed during today's encounter.  I agree with the assessment and therapeutic plan as outlined in the note.   Signed,   R Edgar Frisk, MD

## 2018-06-13 NOTE — Telephone Encounter (Signed)
OV scheduled today with [name]

## 2018-06-14 NOTE — Telephone Encounter (Signed)
Thank you :)

## 2018-06-14 NOTE — Progress Notes (Signed)
Please call this patient and let her know her chest xray form last night indicates her lungs are clear with no active cardiopulmonary disease. Please have her finish the antibiotic course to help her clear the acute sinusitis. Thank you.

## 2018-06-18 ENCOUNTER — Other Ambulatory Visit: Payer: Self-pay

## 2018-06-18 ENCOUNTER — Telehealth: Payer: Self-pay

## 2018-06-18 MED ORDER — DEXTROMETHORPHAN POLISTIREX ER 30 MG/5ML PO SUER: ORAL | 0 refills | Status: DC

## 2018-06-18 NOTE — Telephone Encounter (Signed)
Not that she mentioned it, just as a preventive.

## 2018-06-18 NOTE — Telephone Encounter (Signed)
Sent in rx and left samples at front desk

## 2018-06-18 NOTE — Telephone Encounter (Signed)
Does she have thrush now?

## 2018-06-18 NOTE — Telephone Encounter (Signed)
Pt was also wondering if we could send in a diflucan as antibiotics give her thrush?

## 2018-06-18 NOTE — Telephone Encounter (Signed)
Let's wait on this please

## 2018-06-18 NOTE — Telephone Encounter (Signed)
Can we please provide a sample of Delsym and also place an order with her pharmacy for Delsym 12 hour cough relief 10 ml every 12 hours as needed for 7 days. Please encourage her to use her nasal rinses also. Thank you

## 2018-06-18 NOTE — Telephone Encounter (Signed)
Pt called in stating she is still coughing. No fever she knows of. She is sore from coughing so much. She did ask for samples of delsym. I do have samples waiting up front but can we do anything else for her coughing? Pt does still cough with the robitussin we sent in and did finish the course of prednisone this am.

## 2018-06-19 NOTE — Telephone Encounter (Signed)
ok 

## 2018-06-20 ENCOUNTER — Telehealth: Payer: Self-pay | Admitting: Pediatrics

## 2018-06-20 MED ORDER — FLUCONAZOLE 150 MG PO TABS: ORAL_TABLET | ORAL | 0 refills | Status: DC

## 2018-06-20 NOTE — Telephone Encounter (Signed)
Angela Jimenez said when she talked to someone about getting some cough syrup a couple days ago she asked if she could get something called in for a yeast infection since she is on her second round of antibiotics.  She hasn't heard anything back.  [facility] in [place].

## 2018-06-20 NOTE — Telephone Encounter (Signed)
Called patient.  She now has a vaginal yeast infection.  Would like Diflucan called into [facility] Please advise.

## 2018-06-20 NOTE — Telephone Encounter (Signed)
Can you please order diflucan 150 mg tablet #2. Take one now and take the other one in 3 days. Thank you

## 2018-06-20 NOTE — Telephone Encounter (Signed)
Sent in Diflucan 150 mg #2.  Called patient with information.

## 2018-07-04 DIAGNOSIS — Z882 Allergy status to sulfonamides status: Secondary | ICD-10-CM | POA: Diagnosis not present

## 2018-07-04 DIAGNOSIS — Z79899 Other long term (current) drug therapy: Secondary | ICD-10-CM | POA: Diagnosis not present

## 2018-07-04 DIAGNOSIS — R0682 Tachypnea, not elsewhere classified: Secondary | ICD-10-CM | POA: Diagnosis not present

## 2018-07-04 DIAGNOSIS — Z91040 Latex allergy status: Secondary | ICD-10-CM | POA: Diagnosis not present

## 2018-07-04 DIAGNOSIS — Z91018 Allergy to other foods: Secondary | ICD-10-CM | POA: Diagnosis not present

## 2018-07-04 DIAGNOSIS — H9201 Otalgia, right ear: Secondary | ICD-10-CM | POA: Diagnosis not present

## 2018-07-04 DIAGNOSIS — Z880 Allergy status to penicillin: Secondary | ICD-10-CM | POA: Diagnosis not present

## 2018-07-04 DIAGNOSIS — H9209 Otalgia, unspecified ear: Secondary | ICD-10-CM | POA: Diagnosis not present

## 2018-07-04 DIAGNOSIS — R0602 Shortness of breath: Secondary | ICD-10-CM | POA: Diagnosis not present

## 2018-07-04 DIAGNOSIS — R112 Nausea with vomiting, unspecified: Secondary | ICD-10-CM | POA: Diagnosis not present

## 2018-07-04 DIAGNOSIS — Z7951 Long term (current) use of inhaled steroids: Secondary | ICD-10-CM | POA: Diagnosis not present

## 2018-07-04 DIAGNOSIS — Z91010 Allergy to peanuts: Secondary | ICD-10-CM | POA: Diagnosis not present

## 2018-07-04 DIAGNOSIS — Z881 Allergy status to other antibiotic agents status: Secondary | ICD-10-CM | POA: Diagnosis not present

## 2018-07-06 ENCOUNTER — Other Ambulatory Visit: Payer: Self-pay

## 2018-07-06 ENCOUNTER — Encounter: Payer: Self-pay | Admitting: Family Medicine

## 2018-07-06 ENCOUNTER — Ambulatory Visit (INDEPENDENT_AMBULATORY_CARE_PROVIDER_SITE_OTHER): Payer: PPO | Admitting: Family Medicine

## 2018-07-06 VITALS — BP 118/84 | HR 95 | Temp 99.1°F | Resp 20

## 2018-07-06 DIAGNOSIS — J4540 Moderate persistent asthma, uncomplicated: Secondary | ICD-10-CM | POA: Diagnosis not present

## 2018-07-06 DIAGNOSIS — R05 Cough: Secondary | ICD-10-CM | POA: Diagnosis not present

## 2018-07-06 DIAGNOSIS — T7800XD Anaphylactic reaction due to unspecified food, subsequent encounter: Secondary | ICD-10-CM | POA: Diagnosis not present

## 2018-07-06 DIAGNOSIS — K219 Gastro-esophageal reflux disease without esophagitis: Secondary | ICD-10-CM | POA: Diagnosis not present

## 2018-07-06 DIAGNOSIS — J302 Other seasonal allergic rhinitis: Secondary | ICD-10-CM | POA: Diagnosis not present

## 2018-07-06 DIAGNOSIS — J3089 Other allergic rhinitis: Secondary | ICD-10-CM | POA: Diagnosis not present

## 2018-07-06 DIAGNOSIS — R059 Cough, unspecified: Secondary | ICD-10-CM

## 2018-07-06 MED ORDER — EPINEPHRINE 0.3 MG/0.3ML IJ SOAJ: INTRAMUSCULAR | 2 refills | Status: DC

## 2018-07-06 MED ORDER — FLUTICASONE PROPIONATE 50 MCG/ACT NA SUSP: 1.0000 | Freq: Every day | NASAL | 5 refills | Status: DC

## 2018-07-06 MED ORDER — PATADAY 0.2 % OP SOLN: OPHTHALMIC | 5 refills | Status: DC

## 2018-07-06 NOTE — Progress Notes (Signed)
[place] [address] Dept: [phone]  FOLLOW UP NOTE  Patient ID: Angela Jimenez, female    DOB: 1971-05-10  Age: 48 y.o. MRN: [id number] Date of Office Visit: 07/06/2018  Assessment  Chief Complaint: Sinus Problem (ear fullness)  HPI Angela Jimenez is a 48 year old female who presents to the clinic for a sick visit. She was last seen in this clinic on 06/13/2018 by Gareth Morgan, NP for evaluation of acute sinusitis which required an antibiotic and prednisone for resolution. In the interim, she visited the ED 2 days ago for vomiting and trouble breathing for which she used her EpiPen and was given phenergan and zofran prescriptions for home use. She reports she had almond milk in her tea on Tuesday night which she believes is responsible for the cause of the vomiting. She reports trouble getting a breath out and talking with the vomiting. She denies throat or tongue swelling with the vomiting. At today's visit, she reports cough with post nasal drainage, bilateral ear fullness, and clear nasal drainage that began on Tuesday night. She is not currently using nasal sprays or rinses. She denies fever. She reports asthma as better than earlier in the week with no shortness of breath, cough or wheeze. She continues on Symbicort 160-2 puffs twice a day, Quvar 80-2 puffs twice a day, montelukast 10 mg once a day and albuterol 1-2 times a day on a schedule. She has been counseled several times regarding avoidance of scheduled albuterol, however, she continues this practice. Her current medications are listed in the chart.    Drug Allergies:  Allergies  Allergen Reactions  . Amoxicillin Anaphylaxis  . Other Other (See Comments) and Hives    Tree nuts, anaphylaxis and migraine headache migranes migranes  . Peanut-Containing Drug Products Anaphylaxis  . Penicillins Anaphylaxis  . Wheat Bran   . Latex Hives  . Prednisone Other (See Comments)    "Makes me numb and tingly"  increases eye pressure.   . Ciprofloxacin     Anaphylaxis   . Ciprofloxacin-Hydrocortisone   . Corn-Containing Products   . Molds & Smuts Hives    Shortness of breath  . Sulfa Antibiotics     Other reaction(s): UNSPECIFIED    Physical Exam: BP 118/84   Pulse 95   Temp 99.1 F (37.3 C) (Oral)   Resp 20   SpO2 96%    Physical Exam Vitals signs reviewed.  Constitutional:      Appearance: Normal appearance.  HENT:     Head: Normocephalic and atraumatic.     Right Ear: Tympanic membrane normal.     Left Ear: Tympanic membrane normal.     Nose:     Comments: Bilateral nares edematous and erythematous with clear thin nasal drainage noted. Pharynx erythematous with no exudate. Ears normal. Eyes normal. Eyes:     Conjunctiva/sclera: Conjunctivae normal.  Neck:     Musculoskeletal: Normal range of motion and neck supple.  Cardiovascular:     Rate and Rhythm: Normal rate and regular rhythm.     Heart sounds: Normal heart sounds. No murmur.  Pulmonary:     Effort: Pulmonary effort is normal.     Breath sounds: Normal breath sounds.     Comments: Lungs clear to auscultation Musculoskeletal: Normal range of motion.  Skin:    General: Skin is warm and dry.  Neurological:     Mental Status: She is alert and oriented to person, place, and time.  Psychiatric:  Mood and Affect: Mood normal.        Behavior: Behavior normal.        Thought Content: Thought content normal.        Judgment: Judgment normal.     Diagnostics: FVC 2.74, FEV1 2.21. Predicted FVC 2.72, predicted FEV1 2.20. Spirometry is within the normal range.  Assessment and Plan: 1. Moderate persistent asthma without complication   2. Cough   3. Seasonal and perennial allergic rhinitis   4. Gastroesophageal reflux disease without esophagitis   5. Anaphylactic shock due to food, subsequent encounter     Meds ordered this encounter  Medications  . fluticasone (FLONASE) 50 MCG/ACT nasal spray    Sig:  Place 1 spray into both nostrils daily.    Dispense:  16 g    Refill:  5  . PATADAY 0.2 % SOLN    Sig: ONE DROP EACH EYE ONCE A DAY FOR ITCHY EYES    Dispense:  2.5 mL    Refill:  5  . EPINEPHrine (EPIPEN 2-PAK) 0.3 mg/0.3 mL IJ SOAJ injection    Sig: Use as directed for severe allergic reactions    Dispense:  2 Device    Refill:  2    Patient Instructions  Seasonal and perennial allergic rhinitis Begin Flonase 1 spray in each nostril once a day. Right nostril point the applicator out toward the right ear Stop Xyzal for 10 days, while you are taking Mucinex Continue allergen avoidance measures Continue nasal saline rinses. Use before medicated nasal sprays  Cough Chest xray from 2 days ago showed no pulmonary disease. Cough is most likely to nasal drainage Continue Delsym 2 teaspoonfuls every 12 hours as needed for cough  Gastroesophageal reflux disease without esophagitis Continue lifestyle modifications Begin famotidine 20 mg twice a day as needed for reflux  Moderate persistent asthma without complication Continue Symbicort 160-2 puffs twice a day with a spacer to prevent cough and wheeze Continue Qvar 80 -2 puffs once a day Continue montelukast 10 mg once a day to prevent cough and wheeze Continue albuterol as needed for cough or wheeze  Anaphylactic shock due to food, subsequent encounter Continue to avoid almond milk, peanuts, tree nuts, wheat, and corn products. If you are having an allergic reaction take Benadryl 50 mg and if you have life threatening symptoms inject with EpiPen 0.3 mg and call 911  Follow up in 4 weeks or sooner if needed   Return in about 4 weeks (around 08/03/2018), or if symptoms worsen or fail to improve.    Thank you for the opportunity to care for this patient.  Please do not hesitate to contact me with questions.  Gareth Morgan, FNP [facility]

## 2018-07-06 NOTE — Patient Instructions (Addendum)
Seasonal and perennial allergic rhinitis Begin Flonase 1 spray in each nostril once a day. Right nostril point the applicator out toward the right ear Stop Xyzal for 10 days, while you are taking Mucinex Continue allergen avoidance measures Continue nasal saline rinses. Use before medicated nasal sprays  Cough Chest xray from 2 days ago showed no pulmonary disease. Cough is most likely to nasal drainage Continue Delsym 2 teaspoonfuls every 12 hours as needed for cough  Gastroesophageal reflux disease without esophagitis Continue lifestyle modifications Begin famotidine 20 mg twice a day as needed for reflux  Moderate persistent asthma without complication Continue Symbicort 160-2 puffs twice a day with a spacer to prevent cough and wheeze Continue Qvar 80 -2 puffs once a day Continue montelukast 10 mg once a day to prevent cough and wheeze Continue albuterol as needed for cough or wheeze  Anaphylactic shock due to food, subsequent encounter Continue to avoid almond milk, peanuts, tree nuts, wheat, and corn products. If you are having an allergic reaction take Benadryl 50 mg and if you have life threatening symptoms inject with EpiPen 0.3 mg and call 911  Follow up in 4 weeks or sooner if needed

## 2018-07-07 ENCOUNTER — Other Ambulatory Visit: Payer: Self-pay | Admitting: Pediatrics

## 2018-07-08 DIAGNOSIS — J0100 Acute maxillary sinusitis, unspecified: Secondary | ICD-10-CM | POA: Diagnosis not present

## 2018-07-08 DIAGNOSIS — J069 Acute upper respiratory infection, unspecified: Secondary | ICD-10-CM | POA: Diagnosis not present

## 2018-07-09 ENCOUNTER — Other Ambulatory Visit: Payer: Self-pay

## 2018-07-09 NOTE — Patient Outreach (Signed)
[facility]) Care Management  07/06/2018  Angela Jimenez 21-Nov-1970 [id number]   Referral Date: 07/06/2018 Referral Source: UM referral Referral Reason: Medication Assistance and possible transportation assistance   Outreach Attempt: no answer.  HIPAA compliant voice message left.   Plan: RN CM will attempt again within 4 business days and send letter.  Jone Baseman, RN, MSN [place] Management Care Management Coordinator Direct Line [phone] Cell [phone] Toll Free: [phone]  Fax: [phone]

## 2018-07-09 NOTE — Patient Outreach (Signed)
[facility]) Care Management  07/09/2018  Angela Jimenez November 19, 1970 [id number]   Referral Date: 07/06/2018 Referral Source: UM referral Referral Reason: Medication Assistance and possible transportation assistance   Incoming call from patient.  She is able to verify HIPAA.  Discussed reason for referral.  She states that she has problems affording some of her medications.  Discussed [facility] services with patient.  She reports she stopped taking the Pataday eye drops a while back.  However, patient reports that she cannot afford her Epipen that is about 8.50 and her replesta wafers about 16.00.  Patient agreeable to pharmacy referral for help with medications.  Asked patient about her transportation needs.  She states that she drives her car but it is unreliable.  Discussed [facility] social work assisting with giving her some transportation resources just in case.  She is agreeable.    Patient asked about her TENS unit and battery as she has some muscle aches.  Asked patient if she knew the model or even the battery size. She states no and looks at it on the call. She states that it is a flex-it model.  CM checked information online during the call. Advised patient that she could take the battery to [facility] or Local battery store for replacement.  She verbalized understanding.    Patient states she is being treated for a sinus infection and feels some better as she is not coughing like she was.   Patient states she got into some trouble as she put some almond milk in her tea causing an allergic reaction that sent her to the ER.  Discussed use of Epipen and treatment of her sinus infection and if no better contacting her physician again.  She verbalized understanding.  Patient declines any other questions or concerns.  Plan: RN CM refer to social work and pharmacy.    Jone Baseman, RN, MSN [place] Management Care Management Coordinator Direct Line [phone] Cell  [phone] Toll Free: [phone]  Fax: [phone]

## 2018-07-09 NOTE — Patient Outreach (Signed)
[facility]) Care Management  07/09/2018  Angela Jimenez Apr 04, 1971 [id number]   Referral Date: 07/06/2018 Referral Source: UM referral Referral Reason: Medication Assistance and possible transportation assistance   Outreach Attempt: no answer.  HIPAA compliant voice message left.   Plan: RN CM will attempt again within 4 business days.     Jone Baseman, RN, MSN [facility] Care Management Care Management Coordinator Direct Line [phone] Toll Free: [phone]  Fax: [phone]

## 2018-07-11 ENCOUNTER — Other Ambulatory Visit: Payer: Self-pay | Admitting: Pharmacist

## 2018-07-11 ENCOUNTER — Ambulatory Visit: Payer: Self-pay

## 2018-07-11 ENCOUNTER — Telehealth: Payer: Self-pay | Admitting: *Deleted

## 2018-07-11 DIAGNOSIS — N920 Excessive and frequent menstruation with regular cycle: Secondary | ICD-10-CM | POA: Diagnosis not present

## 2018-07-11 DIAGNOSIS — R102 Pelvic and perineal pain: Secondary | ICD-10-CM | POA: Diagnosis not present

## 2018-07-11 DIAGNOSIS — N83202 Unspecified ovarian cyst, left side: Secondary | ICD-10-CM | POA: Diagnosis not present

## 2018-07-11 DIAGNOSIS — N83201 Unspecified ovarian cyst, right side: Secondary | ICD-10-CM | POA: Diagnosis not present

## 2018-07-11 DIAGNOSIS — N925 Other specified irregular menstruation: Secondary | ICD-10-CM | POA: Diagnosis not present

## 2018-07-11 NOTE — Patient Outreach (Signed)
[facility]) Care Management  07/11/2018  Angela Jimenez 02/12/1971 [id number]   Patient was referred for medication assistance by [place] Nurse after an ED screening. Patient recently visited the ED for nausea, vomiting, and SOB.  Referral was sent for medication assistance with Replesta and EpiPen.  Patient has HTA and LIS 1 (FULL EXTRA HELP).  Epi Pen was filled this month with a copay of $8.95.  There are no programs to provide EpiPens to patient's with Medicare Part D with the exception of RX Outreach will provides 2 pens for $200.  Since patient has FULL EXTRA HELP, the $8.95 is the lowest the copay will be.  Replesta is a medical food and does not have a patient assistance program. Since it is 5000u of Vitamin D3, patient may consider taking the capsules.  There was a coupon available through singlecare https://www.singlecare.com/prescription/replesta (Not sure about the reliability)  Patient was called. She did not answer. HIPAA compliant message was left on her voicemail.  Plan: Send patient an unsuccessful contact letter. Call patient back in 3-5 business days.  Elayne Guerin, PharmD, [place] Clinical Pharmacist [phone]

## 2018-07-11 NOTE — Telephone Encounter (Signed)
Called and left voicemail asking for patient to call back to update insurance information in order to initiate PA for Pataday .2%.

## 2018-07-12 ENCOUNTER — Telehealth: Payer: Self-pay | Admitting: *Deleted

## 2018-07-12 ENCOUNTER — Ambulatory Visit: Payer: PPO | Admitting: Family Medicine

## 2018-07-12 MED ORDER — PREDNISONE 10 MG PO TABS: ORAL_TABLET | ORAL | 0 refills | Status: DC

## 2018-07-12 NOTE — Telephone Encounter (Signed)
Please order prednisone 10 mg. Take 2 tablets for 4 days, then take 1 tablet on the 5th day. Can you please call her to let her know to pick this up. Thank you

## 2018-07-12 NOTE — Telephone Encounter (Signed)
Thank you :)

## 2018-07-12 NOTE — Telephone Encounter (Signed)
Pt informed

## 2018-07-12 NOTE — Telephone Encounter (Signed)
Pt has had a sinus infection for several weeks and is taking all medications that we recommended/prescribed. She is on Doxycycline from her primary doctor but is still having sinus pressure, pain and is very nauseated. She is wondering if we can call in some prednisone to [facility] on [address] in [place].

## 2018-07-13 ENCOUNTER — Other Ambulatory Visit: Payer: Self-pay

## 2018-07-13 ENCOUNTER — Telehealth: Payer: Self-pay | Admitting: *Deleted

## 2018-07-13 NOTE — Telephone Encounter (Signed)
Unable to complete PA Pataday due to insurance information not being updated.

## 2018-07-13 NOTE — Patient Outreach (Addendum)
[facility]) Care Management  07/13/2018  Angela Jimenez [birth year] [id number]   Successful outreach to patient regarding social work referral for transportation resources.  [name] reported that she is able to drive and owns a vehicle but it is not always reliable.  BSW explained that transportation resources, other that things like [place] or the public bus system, are specifically for individuals that are physically unable to drive themselves or get to a public bus stop.  BSW is not able to provide resources for her particular request.  Closing case at this time. BSW informed patient that Pharmacist, Denyse Amass, has attempted to reach her.  Provided [name] contact information.   Ronn Melena, BSW Social Worker [phone]

## 2018-07-16 ENCOUNTER — Inpatient Hospital Stay: Payer: PPO

## 2018-07-16 ENCOUNTER — Ambulatory Visit: Payer: PPO | Admitting: *Deleted

## 2018-07-16 ENCOUNTER — Inpatient Hospital Stay: Payer: PPO | Admitting: Family

## 2018-07-17 ENCOUNTER — Inpatient Hospital Stay: Payer: PPO | Attending: Family

## 2018-07-17 ENCOUNTER — Inpatient Hospital Stay (HOSPITAL_BASED_OUTPATIENT_CLINIC_OR_DEPARTMENT_OTHER): Payer: PPO | Admitting: Family

## 2018-07-17 ENCOUNTER — Other Ambulatory Visit: Payer: Self-pay

## 2018-07-17 VITALS — BP 109/72 | HR 80 | Temp 98.3°F | Resp 18 | Ht 62.0 in | Wt 128.1 lb

## 2018-07-17 DIAGNOSIS — R110 Nausea: Secondary | ICD-10-CM | POA: Insufficient documentation

## 2018-07-17 DIAGNOSIS — R05 Cough: Secondary | ICD-10-CM | POA: Insufficient documentation

## 2018-07-17 DIAGNOSIS — N909 Noninflammatory disorder of vulva and perineum, unspecified: Secondary | ICD-10-CM | POA: Insufficient documentation

## 2018-07-17 DIAGNOSIS — N920 Excessive and frequent menstruation with regular cycle: Secondary | ICD-10-CM

## 2018-07-17 DIAGNOSIS — N83201 Unspecified ovarian cyst, right side: Secondary | ICD-10-CM | POA: Diagnosis not present

## 2018-07-17 DIAGNOSIS — D508 Other iron deficiency anemias: Secondary | ICD-10-CM | POA: Insufficient documentation

## 2018-07-17 DIAGNOSIS — D500 Iron deficiency anemia secondary to blood loss (chronic): Secondary | ICD-10-CM | POA: Insufficient documentation

## 2018-07-17 DIAGNOSIS — R5383 Other fatigue: Secondary | ICD-10-CM | POA: Insufficient documentation

## 2018-07-17 DIAGNOSIS — N83202 Unspecified ovarian cyst, left side: Secondary | ICD-10-CM | POA: Diagnosis not present

## 2018-07-17 DIAGNOSIS — K909 Intestinal malabsorption, unspecified: Secondary | ICD-10-CM

## 2018-07-17 LAB — RETICULOCYTES
Immature Retic Fract: 13.7 % (ref 2.3–15.9)
RBC.: 4.12 MIL/uL (ref 3.87–5.11)
Retic Count, Absolute: 57.7 10*3/uL (ref 19.0–186.0)
Retic Ct Pct: 1.4 % (ref 0.4–3.1)

## 2018-07-17 LAB — CBC WITH DIFFERENTIAL (CANCER CENTER ONLY)
Abs Immature Granulocytes: 0.02 10*3/uL (ref 0.00–0.07)
Basophils Absolute: 0 10*3/uL (ref 0.0–0.1)
Basophils Relative: 0 %
Eosinophils Absolute: 0.1 10*3/uL (ref 0.0–0.5)
Eosinophils Relative: 1 %
HCT: 39 % (ref 36.0–46.0)
Hemoglobin: 12.8 g/dL (ref 12.0–15.0)
Immature Granulocytes: 0 %
Lymphocytes Relative: 39 %
Lymphs Abs: 3.4 10*3/uL (ref 0.7–4.0)
MCH: 31.1 pg (ref 26.0–34.0)
MCHC: 32.8 g/dL (ref 30.0–36.0)
MCV: 94.7 fL (ref 80.0–100.0)
Monocytes Absolute: 0.6 10*3/uL (ref 0.1–1.0)
Monocytes Relative: 7 %
Neutro Abs: 4.7 10*3/uL (ref 1.7–7.7)
Neutrophils Relative %: 53 %
Platelet Count: 322 10*3/uL (ref 150–400)
RBC: 4.12 MIL/uL (ref 3.87–5.11)
RDW: 13 % (ref 11.5–15.5)
WBC Count: 8.7 10*3/uL (ref 4.0–10.5)
nRBC: 0 % (ref 0.0–0.2)

## 2018-07-17 NOTE — Progress Notes (Signed)
Hematology and Oncology Follow Up Visit  Angela Jimenez [id number] August 06, 1970 48 y.o. 07/17/2018   Principle Diagnosis:  Iron deficiency anemia secondary to malabsorptionand heavy cycles  Current Therapy:   IV iron as indicated    Interim History:  Angela Jimenez is here today for follow-up. She is getting over a sinus infection and finishing her 3rd round of antibiotics. She is currently on Doxycycline and she states that she finished her steroid taper yesterday.  She still has a dry cough and some mild SOB with over exertion. She will take breaks to rest when needed.  She is feeling fatigued and did not sleep well while on the steroid.  She has had some nausea but this seems to have improved since yesterday.  She states that her cycle is 2 months late. She saw her gynecologist last week and was diagnosed with cysts on both ovaries. She has had some cramping and bloating and has taken ibuprofen as needed. She states that they are just watching for now. No episodes of bleeding, no petechiae. She states that she bruises easily but not in excess.  No fever, chills, vomiting, rash, dizziness, chest pain, palpitations, abdominal pain or changes in bowel or bladder habits.  No swelling, tenderness, numbness or tingling in her extremities.  No lymphadenopathy noted on exam.  She has a good appetite and is staying hydrated. Her weight is stable.   ECOG Performance Status: 1 - Symptomatic but completely ambulatory  Medications:  Allergies as of 07/17/2018      Reactions   Amoxicillin Anaphylaxis   Other Other (See Comments), Hives   Tree nuts, anaphylaxis and migraine headache migranes migranes   Peanut-containing Drug Products Anaphylaxis   Penicillins Anaphylaxis   Wheat Bran    Latex Hives   Prednisone Other (See Comments)   "Makes me numb and tingly" increases eye pressure.    Ciprofloxacin    Anaphylaxis   Ciprofloxacin-hydrocortisone    Corn-containing Products    Molds & Smuts  Hives   Shortness of breath   Sulfa Antibiotics    Other reaction(s): UNSPECIFIED      Medication List       Accurate as of July 17, 2018  1:21 PM. Always use your most recent med list.        albuterol 108 (90 Base) MCG/ACT inhaler Commonly known as:  PROVENTIL HFA;VENTOLIN HFA Inhale into the lungs.   benzonatate 100 MG capsule Commonly known as:  TESSALON PERLES Take 1 capsule (100 mg total) by mouth 3 (three) times daily as needed for cough.   budesonide-formoterol 160-4.5 MCG/ACT inhaler Commonly known as:  SYMBICORT Two puffs every 12 hours to prevent cough or wheeze. Rinse, gargle and spit after use.   cyclobenzaprine 10 MG tablet Commonly known as:  FLEXERIL Take 10 mg by mouth as needed for muscle spasms.   EPINEPHrine 0.3 mg/0.3 mL Soaj injection Commonly known as:  EPIPEN 2-PAK Use as directed for severe allergic reactions   fluticasone 50 MCG/ACT nasal spray Commonly known as:  FLONASE Place 1 spray into both nostrils daily.   IRON PO Take by mouth.   LESSINA-28 0.1-20 MG-MCG tablet Generic drug:  levonorgestrel-ethinyl estradiol TK 1 T PO QD   levocetirizine 5 MG tablet Commonly known as:  XYZAL TAKE 1 TABLET BY MOUTH EVERY EVENING   MIRALAX PO Take by mouth.   montelukast 10 MG tablet Commonly known as:  SINGULAIR Take 1 tablet (10 mg total) by mouth at bedtime.   PATADAY  0.2 % Soln Generic drug:  Olopatadine HCl ONE DROP EACH EYE ONCE A DAY FOR ITCHY EYES   predniSONE 10 MG tablet Commonly known as:  DELTASONE Take 2 tablets for 4 days, then 1 tablet on day 5, then stop.   promethazine 12.5 MG tablet Commonly known as:  PHENERGAN Take by mouth.   QVAR REDIHALER 80 MCG/ACT inhaler Generic drug:  beclomethasone Inhale 2 puffs into the lungs daily.   REPLESTA 1.25 MG (50000 UT) Wafr Generic drug:  Cholecalciferol TAKE 1 WAFER BY MOUTH ONCE WEEKLY FOR 6 MONTHS AS DIRECTED   traMADol 50 MG tablet Commonly known as:   ULTRAM Take by mouth as needed.       Allergies:  Allergies  Allergen Reactions  . Amoxicillin Anaphylaxis  . Other Other (See Comments) and Hives    Tree nuts, anaphylaxis and migraine headache migranes migranes  . Peanut-Containing Drug Products Anaphylaxis  . Penicillins Anaphylaxis  . Wheat Bran   . Latex Hives  . Prednisone Other (See Comments)    "Makes me numb and tingly" increases eye pressure.   . Ciprofloxacin     Anaphylaxis   . Ciprofloxacin-Hydrocortisone   . Corn-Containing Products   . Molds & Smuts Hives    Shortness of breath  . Sulfa Antibiotics     Other reaction(s): UNSPECIFIED    Past Medical History, Surgical history, Social history, and Family History were reviewed and updated.  Review of Systems: All other 10 point review of systems is negative.   Physical Exam:  vitals were not taken for this visit.   Wt Readings from Last 3 Encounters:  05/28/18 125 lb 6.4 oz (56.9 kg)  04/16/18 130 lb 4 oz (59.1 kg)  02/13/18 129 lb 1.9 oz (58.6 kg)    Ocular: Sclerae unicteric, pupils equal, round and reactive to light Ear-nose-throat: Oropharynx clear, dentition fair Lymphatic: No cervical, supraclavicular or axillary adenopathy Lungs no rales or rhonchi, good excursion bilaterally Heart regular rate and rhythm, no murmur appreciated Abd soft, nontender, positive bowel sounds, no liver or spleen tip palpated on exam, no fluid wave  MSK no focal spinal tenderness, no joint edema Neuro: non-focal, well-oriented, appropriate affect Breasts: Deferred   Lab Results  Component Value Date   WBC 8.7 07/17/2018   HGB 12.8 07/17/2018   HCT 39.0 07/17/2018   MCV 94.7 07/17/2018   PLT 322 07/17/2018   Lab Results  Component Value Date   FERRITIN 50 04/16/2018   IRON 108 04/16/2018   TIBC 376 04/16/2018   UIBC 268 04/16/2018   IRONPCTSAT 29 04/16/2018   Lab Results  Component Value Date   RETICCTPCT 1.4 07/17/2018   RBC 4.12 07/17/2018   RBC  4.12 07/17/2018   No results found for: KPAFRELGTCHN, LAMBDASER, KAPLAMBRATIO No results found for: IGGSERUM, IGA, IGMSERUM No results found for: Ronnald Ramp, A1GS, A2GS, Tillman Sers, SPEI   Chemistry      Component Value Date/Time   NA 137 09/07/2017 0857   K 3.8 09/07/2017 0857   CL 103 09/07/2017 0857   CO2 27 09/07/2017 0857   BUN 5 (L) 09/07/2017 0857   CREATININE 0.98 09/07/2017 0857      Component Value Date/Time   CALCIUM 9.6 09/07/2017 0857   ALKPHOS 52 09/07/2017 0857   AST 12 09/07/2017 0857   ALT 11 09/07/2017 0857   BILITOT 0.4 09/07/2017 0857       Impression and Plan: Angela Jimenez is a very pleasant 48 yo African  American female with iron deficiency anemia secondary to heavy cycles and malabsorption.  She is symptomatic as mentioned above and recuperating from a sinus infection.  We will see what her iron studies show and bring her back in for infusion if needed.  We will plan to see her in another 4 months.  She will contact our office with any questions or concerns. We can certainly see her sooner if need be.   Laverna Peace, NP 2/25/20201:21 PM

## 2018-07-18 ENCOUNTER — Other Ambulatory Visit: Payer: Self-pay | Admitting: Pharmacist

## 2018-07-18 ENCOUNTER — Ambulatory Visit: Payer: Self-pay | Admitting: Pharmacist

## 2018-07-18 LAB — IRON AND TIBC
Iron: 131 ug/dL (ref 41–142)
Saturation Ratios: 45 % (ref 21–57)
TIBC: 288 ug/dL (ref 236–444)
UIBC: 157 ug/dL (ref 120–384)

## 2018-07-18 LAB — FERRITIN: Ferritin: 72 ng/mL (ref 11–307)

## 2018-07-18 NOTE — Patient Outreach (Signed)
[facility]) Care Management  [place]   07/18/2018  Angela Jimenez Mar 10, 1971 [id number]  Reason for referral: medication assistance  Referral source: Telephonic Nurse Referral medication(s): Replesta Current insurance:HTA  HPI: Patient is a 48 year old female referred for medication assistance. HIPAA identifiers were obtained. Patient has multiple medical conditions including but not limited to:  Allergic rhinitis, GERD, asthma, Chronic Back Pain, and severe food allergies. She expressed concern over paying for Replesta. She said she had an allergic reaction to regular Vitamin D capsules. She said a health food store sales person told her regular vitamin D capsules have wool in them and she is allergic to wool.   Objective: Allergies  Allergen Reactions  . Amoxicillin Anaphylaxis  . Ciprofloxacin Anaphylaxis       . Molds & Smuts Hives and Shortness Of Breath  . Other Anaphylaxis, Hives and Other (See Comments)    Tree nuts, anaphylaxis and migraine headache  . Peanut-Containing Drug Products Anaphylaxis  . Penicillins Anaphylaxis  . Wheat Bran   . Corn-Containing Products Other (See Comments)    Bloated and gassy  . Latex Hives  . Prednisone Other (See Comments)    "Makes me numb and tingly" increases eye pressure.   . Sulfa Antibiotics Other (See Comments)    Other reaction(s): UNSPECIFIED    Medications Reviewed Today    Reviewed by Angela Jimenez, [facility] (Pharmacist) on 07/18/18 at 1207  Med List Status: <None>  Medication Order Taking? Sig Documenting Provider Last Dose Status Informant  albuterol (PROVENTIL HFA;VENTOLIN HFA) 108 (90 Base) MCG/ACT inhaler [id number] Yes Inhale into the lungs. [provider] Taking Active   beclomethasone (QVAR REDIHALER) 80 MCG/ACT inhaler [id number] Yes Inhale 2 puffs into the lungs daily.  [provider] Taking Active   benzonatate (TESSALON PERLES) 100 MG capsule [id number] Yes Take 1  capsule (100 mg total) by mouth 3 (three) times daily as needed for cough. Dara Hoyer, FNP Taking Active   budesonide-formoterol [facility]) 160-4.5 MCG/ACT inhaler [id number] Yes Two puffs every 12 hours to prevent cough or wheeze. Rinse, gargle and spit after use. Dara Hoyer, FNP Taking Active   cyclobenzaprine (FLEXERIL) 10 MG tablet [id number] Yes Take 10 mg by mouth as needed for muscle spasms. [provider] Taking Active   doxycycline (VIBRA-TABS) 100 MG tablet [id number] Yes Take 1 tablet by mouth 2 (two) times daily. [provider] Taking Active   EPINEPHrine (EPIPEN 2-PAK) 0.3 mg/0.3 mL IJ SOAJ injection [id number] Yes Use as directed for severe allergic reactions Ambs, Kathrine Cords, FNP Taking Active   fluticasone (FLONASE) 50 MCG/ACT nasal spray [id number] Yes Place 1 spray into both nostrils daily. Dara Hoyer, FNP Taking Active   LESSINA-28 0.1-20 MG-MCG tablet [id number] No TK 1 T PO QD [provider] Not Taking Active   levocetirizine (XYZAL) 5 MG tablet [id number] Yes TAKE 1 TABLET BY MOUTH EVERY EVENING Ambs, Kathrine Cords, FNP Taking Active   montelukast (SINGULAIR) 10 MG tablet [id number] Yes Take 1 tablet (10 mg total) by mouth at bedtime. Charlies Silvers, MD Taking Active   PATADAY 0.2 % Bailey Mech [id number] Yes ONE DROP EACH EYE ONCE A DAY FOR ITCHY EYES Ambs, Kathrine Cords, FNP Taking Active   Polyethylene Glycol 3350 (MIRALAX PO) [id number] Yes Take 1 Scoop by mouth daily.  [provider] Taking Active   promethazine (PHENERGAN) 12.5 MG tablet [id number]  Take 12.5 mg by mouth every 4 (four) hours as needed.  [provider]  Expired 07/11/18 2359   REPLESTA 1.25 MG (50000 UT) Marrian Salvage [id number] Yes TAKE 1 WAFER BY MOUTH ONCE WEEKLY FOR 6 MONTHS AS DIRECTED [provider] Taking Active   traMADol (ULTRAM) 50 MG tablet [id number] Yes Take by mouth as needed. [provider] Taking Active           Assessment:  Drugs sorted by  system:  Pulmonary/Allergy: ProAir, QVar, Symbicort, Benzonatate, Epinephrine, fluticasone nasal spray, ipratropium nasal spray, Levocetirizine, Montelukast, Pataday,   Gastrointestinal: Promethazine, Polyethylene Glycol,   Pain: Tramadol, Cyclobenzaprine  Infectious Diseases: Doxycycline   Genitourinary: Lessina (not taking)  Vitamins/Minerals/Supplements: Replesta   Medication Review Findings:  . Therapeutic Duplication-QVar and Symbicort --both products have an inhaled corticosteroid. Not sure she needs both. Patient reported she get the QVAR from a drug study at [facility].   Medication Assistance Findings:  No medication assistance needs identified  Patient has full LIS/Extra Help  Additional medication assistance options reviewed with patient as warranted:  Coupon and Discount pharmacy lists   [name] is a medical food and does not have a patient assistance program. Since it is 5000u of Vitamin D3, patient may consider taking the capsules.  There was a coupon available through singlecare https://www.singlecare.com/prescription/replesta--coupon link was sent to the patient.   Plan: Close patient's pharmacy case as she has Full LIS.  Send Case Closure Letter Patient was given my contact information for future medication related questions or concerns.  Angela Jimenez, PharmD, [place] Clinical Pharmacist [phone]

## 2018-07-19 ENCOUNTER — Telehealth: Payer: Self-pay | Admitting: Family Medicine

## 2018-07-19 ENCOUNTER — Telehealth: Payer: Self-pay

## 2018-07-19 NOTE — Telephone Encounter (Signed)
PT called in returning Anne's call to her.

## 2018-07-19 NOTE — Telephone Encounter (Signed)
Patient reports she is continuing to experience headache, post nasal drainage, and ear fullness. She reports that she went to [facility] and received Doxycycline last week and she has taken the last dose this morning. The week prior, 07/12/2018 she received a dose pack of prednisone. She denies fever and reports that her cough has cleared. She continues to use her nasal sprays and nasal rinses as ordered. She reports she has not previously been evaluated by ENT specialty.

## 2018-07-19 NOTE — Telephone Encounter (Signed)
Finished antibiotic and still having some coughing, nausea, nasal drainage, feels blah, no fever, ear drainage. She said this has been going on since Jan.

## 2018-07-19 NOTE — Telephone Encounter (Signed)
Please advise 

## 2018-07-23 NOTE — Telephone Encounter (Signed)
Thank you :)

## 2018-07-23 NOTE — Telephone Encounter (Signed)
Referral has been faxed to [facility].   Thanks

## 2018-07-24 DIAGNOSIS — F4323 Adjustment disorder with mixed anxiety and depressed mood: Secondary | ICD-10-CM | POA: Diagnosis not present

## 2018-07-26 DIAGNOSIS — J3089 Other allergic rhinitis: Secondary | ICD-10-CM | POA: Diagnosis not present

## 2018-07-26 DIAGNOSIS — H9209 Otalgia, unspecified ear: Secondary | ICD-10-CM | POA: Diagnosis not present

## 2018-07-31 DIAGNOSIS — F4323 Adjustment disorder with mixed anxiety and depressed mood: Secondary | ICD-10-CM | POA: Diagnosis not present

## 2018-07-31 NOTE — Telephone Encounter (Signed)
Angela Jimenez Can you update this person's insurance so Hollie Beach can complete the PA. Please and thank you.

## 2018-08-01 DIAGNOSIS — J3089 Other allergic rhinitis: Secondary | ICD-10-CM | POA: Diagnosis not present

## 2018-08-01 DIAGNOSIS — J329 Chronic sinusitis, unspecified: Secondary | ICD-10-CM | POA: Diagnosis not present

## 2018-08-08 DIAGNOSIS — N83202 Unspecified ovarian cyst, left side: Secondary | ICD-10-CM | POA: Diagnosis not present

## 2018-08-08 DIAGNOSIS — N941 Unspecified dyspareunia: Secondary | ICD-10-CM | POA: Diagnosis not present

## 2018-08-08 DIAGNOSIS — R300 Dysuria: Secondary | ICD-10-CM | POA: Diagnosis not present

## 2018-08-08 DIAGNOSIS — Z3202 Encounter for pregnancy test, result negative: Secondary | ICD-10-CM | POA: Diagnosis not present

## 2018-08-08 DIAGNOSIS — R102 Pelvic and perineal pain: Secondary | ICD-10-CM | POA: Diagnosis not present

## 2018-08-08 DIAGNOSIS — N83201 Unspecified ovarian cyst, right side: Secondary | ICD-10-CM | POA: Diagnosis not present

## 2018-08-10 ENCOUNTER — Ambulatory Visit: Payer: PPO | Admitting: Family Medicine

## 2018-08-20 ENCOUNTER — Telehealth: Payer: Self-pay

## 2018-08-21 ENCOUNTER — Other Ambulatory Visit: Payer: Self-pay | Admitting: Family Medicine

## 2018-08-21 ENCOUNTER — Telehealth: Payer: Self-pay | Admitting: Family Medicine

## 2018-08-21 NOTE — Telephone Encounter (Signed)
PT mom called in to get med refill for the following:  flonase, epi pen, pataday eye drops, symbicort, singulair, xyzal  Some of these meds were ordered in Jan and Feb but there was a billing issue where she could not pick them up.   Please research and call pt back.  [phone]

## 2018-08-21 NOTE — Telephone Encounter (Signed)
Spoke with pharmacist at [facility] they got everything figured out and refilled everything except pataday as it is now otc, so pt will have to buy it otc, and Flonase was just refilled and insurance will not cover it until September 16, 2018.   Informed pt of all of this. She stated her understanding.

## 2018-08-28 NOTE — Telephone Encounter (Signed)
I called to check and see if the patient was scheduled and it says it is pending review. I will follow back up next week to check again.

## 2018-08-28 NOTE — Telephone Encounter (Signed)
Thank you :)

## 2018-09-03 ENCOUNTER — Ambulatory Visit: Payer: PPO | Admitting: Pediatrics

## 2018-10-01 ENCOUNTER — Other Ambulatory Visit: Payer: Self-pay | Admitting: Family Medicine

## 2018-10-22 DIAGNOSIS — M542 Cervicalgia: Secondary | ICD-10-CM | POA: Diagnosis not present

## 2018-10-25 DIAGNOSIS — F4323 Adjustment disorder with mixed anxiety and depressed mood: Secondary | ICD-10-CM | POA: Diagnosis not present

## 2018-11-02 ENCOUNTER — Other Ambulatory Visit: Payer: Self-pay | Admitting: Family Medicine

## 2018-11-02 NOTE — Telephone Encounter (Signed)
Patient needs office visit.  

## 2018-11-14 DIAGNOSIS — D649 Anemia, unspecified: Secondary | ICD-10-CM | POA: Diagnosis not present

## 2018-11-14 DIAGNOSIS — E559 Vitamin D deficiency, unspecified: Secondary | ICD-10-CM | POA: Diagnosis not present

## 2018-11-14 DIAGNOSIS — B373 Candidiasis of vulva and vagina: Secondary | ICD-10-CM | POA: Diagnosis not present

## 2018-11-15 ENCOUNTER — Encounter: Payer: Self-pay | Admitting: Hematology & Oncology

## 2018-11-15 ENCOUNTER — Inpatient Hospital Stay (HOSPITAL_BASED_OUTPATIENT_CLINIC_OR_DEPARTMENT_OTHER): Payer: PPO | Admitting: Hematology & Oncology

## 2018-11-15 ENCOUNTER — Inpatient Hospital Stay: Payer: PPO | Attending: Hematology & Oncology

## 2018-11-15 ENCOUNTER — Other Ambulatory Visit: Payer: Self-pay

## 2018-11-15 VITALS — BP 123/80 | HR 86 | Temp 98.4°F | Resp 16 | Wt 134.0 lb

## 2018-11-15 DIAGNOSIS — D508 Other iron deficiency anemias: Secondary | ICD-10-CM | POA: Diagnosis not present

## 2018-11-15 DIAGNOSIS — R258 Other abnormal involuntary movements: Secondary | ICD-10-CM | POA: Diagnosis not present

## 2018-11-15 DIAGNOSIS — N920 Excessive and frequent menstruation with regular cycle: Secondary | ICD-10-CM | POA: Diagnosis not present

## 2018-11-15 DIAGNOSIS — F410 Panic disorder [episodic paroxysmal anxiety] without agoraphobia: Secondary | ICD-10-CM

## 2018-11-15 DIAGNOSIS — K219 Gastro-esophageal reflux disease without esophagitis: Secondary | ICD-10-CM

## 2018-11-15 DIAGNOSIS — K920 Hematemesis: Secondary | ICD-10-CM | POA: Diagnosis not present

## 2018-11-15 DIAGNOSIS — K909 Intestinal malabsorption, unspecified: Secondary | ICD-10-CM | POA: Diagnosis not present

## 2018-11-15 DIAGNOSIS — D500 Iron deficiency anemia secondary to blood loss (chronic): Secondary | ICD-10-CM | POA: Insufficient documentation

## 2018-11-15 LAB — CBC WITH DIFFERENTIAL (CANCER CENTER ONLY)
Abs Immature Granulocytes: 0.01 10*3/uL (ref 0.00–0.07)
Basophils Absolute: 0.1 10*3/uL (ref 0.0–0.1)
Basophils Relative: 2 %
Eosinophils Absolute: 0.1 10*3/uL (ref 0.0–0.5)
Eosinophils Relative: 2 %
HCT: 42 % (ref 36.0–46.0)
Hemoglobin: 13.9 g/dL (ref 12.0–15.0)
Immature Granulocytes: 0 %
Lymphocytes Relative: 51 %
Lymphs Abs: 1.7 10*3/uL (ref 0.7–4.0)
MCH: 31 pg (ref 26.0–34.0)
MCHC: 33.1 g/dL (ref 30.0–36.0)
MCV: 93.5 fL (ref 80.0–100.0)
Monocytes Absolute: 0.2 10*3/uL (ref 0.1–1.0)
Monocytes Relative: 6 %
Neutro Abs: 1.3 10*3/uL — ABNORMAL LOW (ref 1.7–7.7)
Neutrophils Relative %: 39 %
Platelet Count: 227 10*3/uL (ref 150–400)
RBC: 4.49 MIL/uL (ref 3.87–5.11)
RDW: 12.2 % (ref 11.5–15.5)
WBC Count: 3.4 10*3/uL — ABNORMAL LOW (ref 4.0–10.5)
nRBC: 0 % (ref 0.0–0.2)

## 2018-11-15 LAB — RETICULOCYTES
Immature Retic Fract: 6.3 % (ref 2.3–15.9)
RBC.: 4.43 MIL/uL (ref 3.87–5.11)
Retic Count, Absolute: 37.2 10*3/uL (ref 19.0–186.0)
Retic Ct Pct: 0.8 % (ref 0.4–3.1)

## 2018-11-15 LAB — CMP (CANCER CENTER ONLY)
ALT: 15 U/L (ref 0–44)
AST: 16 U/L (ref 15–41)
Albumin: 4.4 g/dL (ref 3.5–5.0)
Alkaline Phosphatase: 56 U/L (ref 38–126)
Anion gap: 6 (ref 5–15)
BUN: 10 mg/dL (ref 6–20)
CO2: 31 mmol/L (ref 22–32)
Calcium: 9.4 mg/dL (ref 8.9–10.3)
Chloride: 101 mmol/L (ref 98–111)
Creatinine: 0.9 mg/dL (ref 0.44–1.00)
GFR, Est AFR Am: >60 mL/min (ref >60)
GFR, Estimated: >60 mL/min (ref >60)
Glucose, Bld: 93 mg/dL (ref 70–99)
Potassium: 3.7 mmol/L (ref 3.5–5.1)
Sodium: 138 mmol/L (ref 135–145)
Total Bilirubin: 0.5 mg/dL (ref 0.3–1.2)
Total Protein: 6.7 g/dL (ref 6.5–8.1)

## 2018-11-15 NOTE — Progress Notes (Signed)
Hematology and Oncology Follow Up Visit  Angela Jimenez [id number] 1971-05-01 48 y.o. 11/15/2018   Principle Diagnosis:  Iron deficiency anemia secondary to malabsorptionand heavy cycles  Current Therapy:   IV iron as indicated    Interim History:  Angela Jimenez is here today for follow-up.  She is complaining of "shakiness".  Seems to be in the morning.  This is been going on for couple weeks.  Again she takes a lot of supplements.  I will know for the supplements might be causing her an issue.  She saw her family doctor yesterday.  I am not sure what kind of work-up was done.  I cannot imagine that there is anything going on with her blood with respect to her iron.  I would think that her iron studies should be okay.  She has a myriad of other issues.  Again, a lot of this is stress related.  There is no problems with bleeding.  Patient did have a laparotomy for the uterine fibroids while back.  She is still trying to cover for this.  Overall, her performance status is ECOG 1.   Medications:  Allergies as of 11/15/2018      Reactions   Amoxicillin Anaphylaxis   Ciprofloxacin Anaphylaxis      Molds & Smuts Hives, Shortness Of Breath   Other Anaphylaxis, Hives, Other (See Comments)   Tree nuts, anaphylaxis and migraine headache   Peanut-containing Drug Products Anaphylaxis   Penicillins Anaphylaxis   Wheat Bran    Corn-containing Products Other (See Comments)   Bloated and gassy   Latex Hives   Prednisone Other (See Comments)   "Makes me numb and tingly" increases eye pressure.    Sulfa Antibiotics Other (See Comments)   Other reaction(s): UNSPECIFIED      Medication List       Accurate as of November 15, 2018 12:14 PM. If you have any questions, ask your nurse or doctor.        STOP taking these medications   benzonatate 100 MG capsule Commonly known as: Best boy Stopped by: Volanda Napoleon, MD   doxycycline 100 MG tablet Commonly known as: VIBRA-TABS  Stopped by: Volanda Napoleon, MD   ipratropium 0.06 % nasal spray Commonly known as: ATROVENT Stopped by: Volanda Napoleon, MD     TAKE these medications   albuterol 108 (90 Base) MCG/ACT inhaler Commonly known as: VENTOLIN HFA INHALE 2 PUFFS INTO THE LUNGS EVERY 4 HOURS AS NEEDED FOR WHEEZING OR SHORTNESS OF BREATH   budesonide-formoterol 160-4.5 MCG/ACT inhaler Commonly known as: Symbicort Two puffs every 12 hours to prevent cough or wheeze. Rinse, gargle and spit after use.   cyclobenzaprine 10 MG tablet Commonly known as: FLEXERIL Take 10 mg by mouth as needed for muscle spasms.   EPINEPHrine 0.3 mg/0.3 mL Soaj injection Commonly known as: EPI-PEN INJECT IN THE MUSCLE AS DIRECTED FOR SEVERE ALLERGIC REACTIONS   fluticasone 50 MCG/ACT nasal spray Commonly known as: FLONASE SHAKE LIQUID AND USE 1 SPRAY IN EACH NOSTRIL DAILY   GAS-X PO Take by mouth as needed.   LESSINA-28 0.1-20 MG-MCG tablet Generic drug: levonorgestrel-ethinyl estradiol TK 1 T PO QD   levocetirizine 5 MG tablet Commonly known as: XYZAL TAKE 1 TABLET BY MOUTH EVERY EVENING   MIRALAX PO Take 1 Scoop by mouth daily.   montelukast 10 MG tablet Commonly known as: Singulair Take 1 tablet (10 mg total) by mouth at bedtime.   NON FORMULARY Take 500 mg  by mouth daily. Fibrenza   NON FORMULARY Take 1,000 mg by mouth daily. Pumpkin seed   NON FORMULARY Take by mouth daily. Gluten Defense   NON FORMULARY Take 400 mg by mouth daily. Red Rasberry   Pataday 0.2 % Soln Generic drug: Olopatadine HCl ONE DROP EACH EYE ONCE A DAY FOR ITCHY EYES   promethazine 12.5 MG tablet Commonly known as: PHENERGAN Take 12.5 mg by mouth every 4 (four) hours as needed.   Qvar RediHaler 80 MCG/ACT inhaler Generic drug: beclomethasone Inhale 2 puffs into the lungs daily.   SAW PALMETTO PO Take 580 mg by mouth daily.   Sudafed 30 MG tablet Generic drug: pseudoephedrine Take 30 mg by mouth every 4 (four)  hours as needed for congestion.   traMADol 50 MG tablet Commonly known as: ULTRAM Take by mouth as needed.   VITAMIN D3 PO Take 2,000 Units by mouth. What changed: Another medication with the same name was removed. Continue taking this medication, and follow the directions you see here. Changed by: Volanda Napoleon, MD       Allergies:  Allergies  Allergen Reactions  . Amoxicillin Anaphylaxis  . Ciprofloxacin Anaphylaxis       . Molds & Smuts Hives and Shortness Of Breath  . Other Anaphylaxis, Hives and Other (See Comments)    Tree nuts, anaphylaxis and migraine headache  . Peanut-Containing Drug Products Anaphylaxis  . Penicillins Anaphylaxis  . Wheat Bran   . Corn-Containing Products Other (See Comments)    Bloated and gassy  . Latex Hives  . Prednisone Other (See Comments)    "Makes me numb and tingly" increases eye pressure.   . Sulfa Antibiotics Other (See Comments)    Other reaction(s): UNSPECIFIED    Past Medical History, Surgical history, Social history, and Family History were reviewed and updated.  Review of Systems: All other 10 point review of systems is negative.   Physical Exam:  weight is 134 lb (60.8 kg). Her oral temperature is 98.4 F (36.9 C). Her blood pressure is 123/80 and her pulse is 86. Her respiration is 16 and oxygen saturation is 100%.   Wt Readings from Last 3 Encounters:  11/15/18 134 lb (60.8 kg)  07/17/18 128 lb 1.9 oz (58.1 kg)  05/28/18 125 lb 6.4 oz (56.9 kg)    Ocular: Sclerae unicteric, pupils equal, round and reactive to light Ear-nose-throat: Oropharynx clear, dentition fair Lymphatic: No cervical, supraclavicular or axillary adenopathy Lungs no rales or rhonchi, good excursion bilaterally Heart regular rate and rhythm, no murmur appreciated Abd soft, nontender, positive bowel sounds, no liver or spleen tip palpated on exam, no fluid wave  MSK no focal spinal tenderness, no joint edema Neuro: non-focal, well-oriented,  appropriate affect Breasts: Deferred   Lab Results  Component Value Date   WBC 3.4 (L) 11/15/2018   HGB 13.9 11/15/2018   HCT 42.0 11/15/2018   MCV 93.5 11/15/2018   PLT 227 11/15/2018   Lab Results  Component Value Date   FERRITIN 72 07/17/2018   IRON 131 07/17/2018   TIBC 288 07/17/2018   UIBC 157 07/17/2018   IRONPCTSAT 45 07/17/2018   Lab Results  Component Value Date   RETICCTPCT 0.8 11/15/2018   RBC 4.49 11/15/2018   No results found for: KPAFRELGTCHN, LAMBDASER, KAPLAMBRATIO No results found for: IGGSERUM, IGA, IGMSERUM No results found for: TOTALPROTELP, ALBUMINELP, A1GS, A2GS, BETS, BETA2SER, GAMS, MSPIKE, SPEI   Chemistry      Component Value Date/Time   NA 137  09/07/2017 0857   K 3.8 09/07/2017 0857   CL 103 09/07/2017 0857   CO2 27 09/07/2017 0857   BUN 5 (L) 09/07/2017 0857   CREATININE 0.98 09/07/2017 0857      Component Value Date/Time   CALCIUM 9.6 09/07/2017 0857   ALKPHOS 52 09/07/2017 0857   AST 12 09/07/2017 0857   ALT 11 09/07/2017 0857   BILITOT 0.4 09/07/2017 0857       Impression and Plan: Angela Jimenez is a very pleasant 48 yo African American female with iron deficiency anemia secondary to heavy cycles and malabsorption.   I would what is going on with this "shakiness.".  I know she takes a ton of supplements.  Not sure if she is having some interaction with these supplements.  I will check her TSH.  She saw her family doctor yesterday.  It sounds like nothing was done.  I am sure that Dr. Curly Rim did a thorough work-up.  We will see what her iron studies look like.  I have to believe that they will be okay.  We will have her come back in 3 months just for follow-up.   Volanda Napoleon, MD 6/25/202012:14 PM

## 2018-11-16 LAB — IRON AND TIBC
Iron: 154 ug/dL — ABNORMAL HIGH (ref 41–142)
Saturation Ratios: 46 % (ref 21–57)
TIBC: 338 ug/dL (ref 236–444)
UIBC: 183 ug/dL (ref 120–384)

## 2018-11-16 LAB — TSH: TSH: 0.919 u[IU]/mL (ref 0.308–3.960)

## 2018-11-16 LAB — FERRITIN: Ferritin: 66 ng/mL (ref 11–307)

## 2018-11-27 ENCOUNTER — Other Ambulatory Visit: Payer: Self-pay

## 2018-11-27 ENCOUNTER — Encounter: Payer: Self-pay | Admitting: Family Medicine

## 2018-11-27 ENCOUNTER — Ambulatory Visit (INDEPENDENT_AMBULATORY_CARE_PROVIDER_SITE_OTHER): Payer: PPO | Admitting: Family Medicine

## 2018-11-27 DIAGNOSIS — J4540 Moderate persistent asthma, uncomplicated: Secondary | ICD-10-CM

## 2018-11-27 DIAGNOSIS — J302 Other seasonal allergic rhinitis: Secondary | ICD-10-CM | POA: Diagnosis not present

## 2018-11-27 DIAGNOSIS — T7800XD Anaphylactic reaction due to unspecified food, subsequent encounter: Secondary | ICD-10-CM | POA: Diagnosis not present

## 2018-11-27 DIAGNOSIS — J3089 Other allergic rhinitis: Secondary | ICD-10-CM

## 2018-11-27 DIAGNOSIS — K219 Gastro-esophageal reflux disease without esophagitis: Secondary | ICD-10-CM | POA: Diagnosis not present

## 2018-11-27 DIAGNOSIS — H1010 Acute atopic conjunctivitis, unspecified eye: Secondary | ICD-10-CM

## 2018-11-27 MED ORDER — PAZEO 0.7 % OP SOLN: 1.0000 [drp] | Freq: Every day | OPHTHALMIC | 5 refills | Status: DC | PRN

## 2018-11-27 MED ORDER — ALBUTEROL SULFATE HFA 108 (90 BASE) MCG/ACT IN AERS: INHALATION_SPRAY | RESPIRATORY_TRACT | 0 refills | Status: DC

## 2018-11-27 NOTE — Patient Instructions (Addendum)
Seasonal and perennial allergic rhinitis Begin Flonase 1 spray in each nostril once a day. Right nostril point the applicator out toward the right ear Continue Xyzal 5 mg once a day as needed for a runny nose or itch Continue allergen avoidance measures Begin nasal saline rinses. Use before medicated nasal sprays  Allergic conjunctivitis Stop Pataday and begin Pazeo one drop once a day as needed for red, itchy eyes  Gastroesophageal reflux disease without esophagitis Continue lifestyle modifications as listed below Continue esomeprazole as prescribed by your primary care provider a day as needed for reflux Consider referral to gastrointestinal specialist for evaluation of gas and bloating  Moderate persistent asthma without complication Continue Symbicort 160-2 puffs twice a day with a spacer to prevent cough and wheeze Continue montelukast 10 mg once a day to prevent cough and wheeze Continue albuterol as needed for cough or wheeze  Anaphylactic shock due to food, subsequent encounter Continue to avoid almond milk, peanuts, tree nuts, wheat, and corn products. If you are having an allergic reaction take Benadryl 50 mg and if you have life threatening symptoms inject with EpiPen 0.3 mg and call 911  Follow up in 4 mo or sooner if needed   Lifestyle Changes for Controlling GERD When you have GERD, stomach acid feels as if it's backing up toward your mouth. Whether or not you take medication to control your GERD, your symptoms can often be improved with lifestyle changes.   Raise Your Head  Reflux is more likely to strike when you're lying down flat, because stomach fluid can  flow backward more easily. Raising the head of your bed 4-6 inches can help. To do this:  Slide blocks or books under the legs at the head of your bed. Or, place a wedge under  the mattress. Many foam stores can make a suitable wedge for you. The wedge  should run from your waist to the top of your  head.  Don't just prop your head on several pillows. This increases pressure on your  stomach. It can make GERD worse.  Watch Your Eating Habits Certain foods may increase the acid in your stomach or relax the lower esophageal sphincter, making GERD more likely. It's best to avoid the following:  Coffee, tea, and carbonated drinks (with and without caffeine)  Fatty, fried, or spicy food  Mint, chocolate, onions, and tomatoes  Any other foods that seem to irritate your stomach or cause you pain  Relieve the Pressure  Eat smaller meals, even if you have to eat more often.  Don't lie down right after you eat. Wait a few hours for your stomach to empty.  Avoid tight belts and tight-fitting clothes.  Lose excess weight.  Tobacco and Alcohol  Avoid smoking tobacco and drinking alcohol. They can make GERD symptoms worse.

## 2018-11-27 NOTE — Progress Notes (Signed)
RE: Angela Jimenez MRN: [id number] DOB: [birth year] Date of Telemedicine Visit: 11/27/2018  Referring provider: No ref. provider found Primary care provider: Lyman Bishop, DO  Chief Complaint: Sinus Problem   Telemedicine Follow Up Visit via Telephone: I connected with Angela Jimenez for a follow up on 11/28/18 by telephone and verified that I am speaking with the correct person using two identifiers.   I discussed the limitations, risks, security and privacy concerns of performing an evaluation and management service by telephone and the availability of in person appointments. I also discussed with the patient that there may be a patient responsible charge related to this service. The patient expressed understanding and agreed to proceed.  Patient is at home Provider is at the office.  Visit start time: 9:43 Visit end time: 10:25 Insurance consent/check in by: [place] consent and medical assistant/nurse: Katherina Right  History of Present Illness: She is a 48 y.o. female, who is being followed for asthma, allergic rhinitis, allergic conjunctivitis, reflux, and food allergy to peanut, tree nuts, almond milk, corn, and wheat. Her previous allergy office visit was on 07/06/2018 with Gareth Morgan, [place]. At today's visit, she reports her asthma has been well controlled with Symbicort 160-2 puffs once a day, montelukast 10 mg once a day, and albuterol daily on a routine schedule. Allergic rhinitis is reported as moderately well controlled with sinus pressure for which she takes sudafed with relief of pressure. She has recently started using Flonase and is not using saline nasal rinses at this time. She continues Xyzal once a day and occasionally takes [facility] for relief of allergic rhinitis. She reports occasional occular pruritus for which she has been using Pataday, which is no longer covered by her insurance. She is interested in exploring the possibility of sublingual therapt for her  allergies. She continues to avoid peanuts, tree nuts, almond milk, wheat, and corn. She has not needed to use her epinephrine device since her last visit to this office. She reports gas and bloating that has been occurring for several years and is relieved by putting pressure on her neck or wearing her neck brace. She has been taking esomeprazole on the advice of her primary care provider for the last 2 weeks with no change. She reports that she is experiencing an increased amount of stress primarily related to anxiety about COVID19. Her current medications are listed in the chart.   Assessment and Plan: Seasonal and perennial allergic rhinitis Begin Flonase 1 spray in each nostril once a day. Right nostril point the applicator out toward the right ear Continue Xyzal 5 mg once a day as needed for a runny nose or itch Continue allergen avoidance measures Begin nasal saline rinses. Use before medicated nasal sprays  Allergic conjunctivitis Stop Pataday and begin Pazeo one drop once a day as needed for red, itchy eyes  Gastroesophageal reflux disease without esophagitis Continue lifestyle modifications as listed below Continue esomeprazole as prescribed by your primary care provider a day as needed for reflux Consider referral to gastrointestinal specialist for evaluation of gas and bloating  Moderate persistent asthma without complication Continue Symbicort 160-2 puffs twice a day with a spacer to prevent cough and wheeze Continue montelukast 10 mg once a day to prevent cough and wheeze Continue albuterol as needed for cough or wheeze  Anaphylactic shock due to food, subsequent encounter Continue to avoid almond milk, peanuts, tree nuts, wheat, and corn products. If you are having an allergic reaction take Benadryl 50  mg and if you have life threatening symptoms inject with EpiPen 0.3 mg and call 911  Follow up in 4 mo or sooner if needed  Return in about 4 months (around 03/30/2019), or if  symptoms worsen or fail to improve.  Meds ordered this encounter  Medications  . Olopatadine HCl (PAZEO) 0.7 % SOLN    Sig: Place 1 drop into both eyes daily as needed (for red itchy eyes).    Dispense:  2.5 mL    Refill:  5  . albuterol (VENTOLIN HFA) 108 (90 Base) MCG/ACT inhaler    Sig: INHALE 2 PUFFS INTO THE LUNGS EVERY 4 HOURS AS NEEDED FOR WHEEZING OR SHORTNESS OF BREATH    Dispense:  8.5 g    Refill:  0    Patient needs office visit     Medication List:  Current Outpatient Medications  Medication Sig Dispense Refill  . albuterol (VENTOLIN HFA) 108 (90 Base) MCG/ACT inhaler INHALE 2 PUFFS INTO THE LUNGS EVERY 4 HOURS AS NEEDED FOR WHEEZING OR SHORTNESS OF BREATH 8.5 g 0  . budesonide-formoterol (SYMBICORT) 160-4.5 MCG/ACT inhaler Two puffs every 12 hours to prevent cough or wheeze. Rinse, gargle and spit after use. 10.2 g 5  . Cholecalciferol (VITAMIN D3 PO) Take 2,000 Units by mouth.     . cyclobenzaprine (FLEXERIL) 10 MG tablet Take 10 mg by mouth as needed for muscle spasms.    Marland Kitchen EPINEPHrine 0.3 mg/0.3 mL IJ SOAJ injection INJECT IN THE MUSCLE AS DIRECTED FOR SEVERE ALLERGIC REACTIONS 2 Device 1  . fluticasone (FLONASE) 50 MCG/ACT nasal spray SHAKE LIQUID AND USE 1 SPRAY IN EACH NOSTRIL DAILY (Patient taking differently: Place 1 spray into both nostrils daily as needed. ) 16 g 5  . levocetirizine (XYZAL) 5 MG tablet TAKE 1 TABLET BY MOUTH EVERY EVENING 90 tablet 3  . montelukast (SINGULAIR) 10 MG tablet Take 1 tablet (10 mg total) by mouth at bedtime. 90 tablet 0  . NON FORMULARY Take 500 mg by mouth daily. Fibrenza    . NON FORMULARY Take 1,000 mg by mouth daily. Pumpkin seed    . NON FORMULARY Take by mouth daily. Gluten Defense    . NON FORMULARY Take 400 mg by mouth daily. Red Rasberry    . Polyethylene Glycol 3350 (MIRALAX PO) Take 1 Scoop by mouth daily.     . pseudoephedrine (SUDAFED) 30 MG tablet Take 30 mg by mouth every 4 (four) hours as needed for congestion.     . Saw Palmetto, Serenoa repens, (SAW PALMETTO PO) Take 580 mg by mouth daily.    . Simethicone (GAS-X PO) Take by mouth as needed.    . traMADol (ULTRAM) 50 MG tablet Take by mouth as needed.    . beclomethasone (QVAR REDIHALER) 80 MCG/ACT inhaler Inhale 2 puffs into the lungs daily.     . LESSINA-28 0.1-20 MG-MCG tablet TK 1 T PO QD  3  . Olopatadine HCl (PAZEO) 0.7 % SOLN Place 1 drop into both eyes daily as needed (for red itchy eyes). 2.5 mL 5  . promethazine (PHENERGAN) 12.5 MG tablet Take 12.5 mg by mouth every 4 (four) hours as needed.      No current facility-administered medications for this visit.    Allergies: Allergies  Allergen Reactions  . Amoxicillin Anaphylaxis  . Ciprofloxacin Anaphylaxis       . Molds & Smuts Hives and Shortness Of Breath  . Other Anaphylaxis, Hives and Other (See Comments)    Tree  nuts, anaphylaxis and migraine headache  . Peanut-Containing Drug Products Anaphylaxis  . Penicillins Anaphylaxis  . Wheat Bran   . Corn-Containing Products Other (See Comments)    Bloated and gassy  . Latex Hives  . Prednisone Other (See Comments)    "Makes me numb and tingly" increases eye pressure.   . Sulfa Antibiotics Other (See Comments)    Other reaction(s): UNSPECIFIED   I reviewed her past medical history, social history, family history, and environmental history and no significant changes have been reported from previous visit on 07/06/2018.  Objective: Physical Exam Not obtained as encounter was done via telephone.   Previous notes and tests were reviewed.  I discussed the assessment and treatment plan with the patient. The patient was provided an opportunity to ask questions and all were answered. The patient agreed with the plan and demonstrated an understanding of the instructions.   The patient was advised to call back or seek an in-person evaluation if the symptoms worsen or if the condition fails to improve as anticipated.  I provided 42  minutes of non-face-to-face time during this encounter.  It was my pleasure to participate in [place] care today. Please feel free to contact me with any questions or concerns.   Sincerely,  Carlena Bjornstad, MD

## 2018-11-28 DIAGNOSIS — N839 Noninflammatory disorder of ovary, fallopian tube and broad ligament, unspecified: Secondary | ICD-10-CM | POA: Diagnosis not present

## 2018-11-28 DIAGNOSIS — N83202 Unspecified ovarian cyst, left side: Secondary | ICD-10-CM | POA: Diagnosis not present

## 2018-11-28 DIAGNOSIS — D259 Leiomyoma of uterus, unspecified: Secondary | ICD-10-CM | POA: Diagnosis not present

## 2018-12-06 DIAGNOSIS — M65312 Trigger thumb, left thumb: Secondary | ICD-10-CM | POA: Diagnosis not present

## 2018-12-06 DIAGNOSIS — M65311 Trigger thumb, right thumb: Secondary | ICD-10-CM | POA: Diagnosis not present

## 2018-12-11 DIAGNOSIS — F4323 Adjustment disorder with mixed anxiety and depressed mood: Secondary | ICD-10-CM | POA: Diagnosis not present

## 2018-12-12 DIAGNOSIS — M79641 Pain in right hand: Secondary | ICD-10-CM | POA: Diagnosis not present

## 2018-12-12 DIAGNOSIS — M79642 Pain in left hand: Secondary | ICD-10-CM | POA: Diagnosis not present

## 2018-12-25 DIAGNOSIS — M25531 Pain in right wrist: Secondary | ICD-10-CM | POA: Diagnosis not present

## 2018-12-25 DIAGNOSIS — M545 Low back pain: Secondary | ICD-10-CM | POA: Diagnosis not present

## 2018-12-25 DIAGNOSIS — M791 Myalgia, unspecified site: Secondary | ICD-10-CM | POA: Diagnosis not present

## 2018-12-25 DIAGNOSIS — M542 Cervicalgia: Secondary | ICD-10-CM | POA: Diagnosis not present

## 2018-12-25 DIAGNOSIS — M79644 Pain in right finger(s): Secondary | ICD-10-CM | POA: Diagnosis not present

## 2018-12-26 DIAGNOSIS — F4323 Adjustment disorder with mixed anxiety and depressed mood: Secondary | ICD-10-CM | POA: Diagnosis not present

## 2019-01-01 DIAGNOSIS — M25531 Pain in right wrist: Secondary | ICD-10-CM | POA: Diagnosis not present

## 2019-01-01 DIAGNOSIS — M542 Cervicalgia: Secondary | ICD-10-CM | POA: Diagnosis not present

## 2019-01-01 DIAGNOSIS — M545 Low back pain: Secondary | ICD-10-CM | POA: Diagnosis not present

## 2019-01-01 DIAGNOSIS — M791 Myalgia, unspecified site: Secondary | ICD-10-CM | POA: Diagnosis not present

## 2019-01-01 DIAGNOSIS — M79644 Pain in right finger(s): Secondary | ICD-10-CM | POA: Diagnosis not present

## 2019-01-03 DIAGNOSIS — M65312 Trigger thumb, left thumb: Secondary | ICD-10-CM | POA: Diagnosis not present

## 2019-01-03 DIAGNOSIS — M65311 Trigger thumb, right thumb: Secondary | ICD-10-CM | POA: Diagnosis not present

## 2019-01-04 ENCOUNTER — Telehealth: Payer: Self-pay | Admitting: Pediatrics

## 2019-01-04 NOTE — Telephone Encounter (Signed)
PT called to leave msg for anne or bardelas. pcp will not submit referral to see rheumatoid dr. main issue trigger finger and getting steriod shots in both hands. also has vit d and anemia but it is controlled. pcp says she may have inflamation due to food intolerance. PT really wants referral to rheumatology.  Lauro Regulus [phone]

## 2019-01-07 NOTE — Telephone Encounter (Signed)
Tried calling pt but voice mailbox is full at this time

## 2019-01-07 NOTE — Telephone Encounter (Signed)
She is getting steroid injections which should help any inflammatory disease.  Let us know if she gets better with the injections and then worse when they wear off.  Does her primary care physician have to refer her for her insurance to pay

## 2019-01-07 NOTE — Telephone Encounter (Addendum)
Pt is doing steroid injections monthly. Pt states they are not helping, as far as she can tell. Pt has health team advantage, and medicare due to being on disability. PCP does not want to do it.

## 2019-01-07 NOTE — Telephone Encounter (Signed)
Please advise 

## 2019-01-08 ENCOUNTER — Telehealth: Payer: Self-pay | Admitting: *Deleted

## 2019-01-08 DIAGNOSIS — M25531 Pain in right wrist: Secondary | ICD-10-CM | POA: Diagnosis not present

## 2019-01-08 DIAGNOSIS — M791 Myalgia, unspecified site: Secondary | ICD-10-CM | POA: Diagnosis not present

## 2019-01-08 DIAGNOSIS — M79644 Pain in right finger(s): Secondary | ICD-10-CM | POA: Diagnosis not present

## 2019-01-08 DIAGNOSIS — M542 Cervicalgia: Secondary | ICD-10-CM | POA: Diagnosis not present

## 2019-01-08 DIAGNOSIS — M545 Low back pain: Secondary | ICD-10-CM | POA: Diagnosis not present

## 2019-01-08 NOTE — Telephone Encounter (Signed)
Per Dr. Shaune Leeks please refer pt to rheumatology Dr. Estanislado Pandy for arthritis.  Refer to tele enc 01/04/19.

## 2019-01-09 NOTE — Telephone Encounter (Signed)
Go ahead and schedule her with Dr.Deveshwar  -  a rheumatologist in [facility]

## 2019-01-10 DIAGNOSIS — H5213 Myopia, bilateral: Secondary | ICD-10-CM | POA: Diagnosis not present

## 2019-01-10 DIAGNOSIS — Z135 Encounter for screening for eye and ear disorders: Secondary | ICD-10-CM | POA: Diagnosis not present

## 2019-01-10 DIAGNOSIS — F4323 Adjustment disorder with mixed anxiety and depressed mood: Secondary | ICD-10-CM | POA: Diagnosis not present

## 2019-01-10 NOTE — Telephone Encounter (Signed)
Referral has been placed to Dr Herminio Heads with [facility].

## 2019-01-10 NOTE — Telephone Encounter (Signed)
Referral has been placed to Dr Herminio Heads with [facility].

## 2019-01-14 ENCOUNTER — Telehealth: Payer: Self-pay | Admitting: *Deleted

## 2019-01-14 NOTE — Telephone Encounter (Signed)
Pt called asking if her allergy test showed a corn allergy. We did a full food panel skin test 06/2017 and all foods were negative. She believes she still has a corn allergy because there is a medication that she takes that has a corn ingredient and it is causing gas pain. Her daughter is also allergic to corn. Told her to avoid any corn products that may cause issues. Pt is interested in getting blood tests for foods. Please advise. She is aware that [name] will be back in on Wednesday.

## 2019-01-15 DIAGNOSIS — M79644 Pain in right finger(s): Secondary | ICD-10-CM | POA: Diagnosis not present

## 2019-01-15 DIAGNOSIS — M791 Myalgia, unspecified site: Secondary | ICD-10-CM | POA: Diagnosis not present

## 2019-01-15 DIAGNOSIS — M25531 Pain in right wrist: Secondary | ICD-10-CM | POA: Diagnosis not present

## 2019-01-15 DIAGNOSIS — M542 Cervicalgia: Secondary | ICD-10-CM | POA: Diagnosis not present

## 2019-01-15 DIAGNOSIS — M545 Low back pain: Secondary | ICD-10-CM | POA: Diagnosis not present

## 2019-01-16 NOTE — Telephone Encounter (Signed)
Pt scheduled for in office visit.

## 2019-01-16 NOTE — Telephone Encounter (Signed)
Can you please have her set up an appointment in the office or phone? Thank you

## 2019-01-18 NOTE — Progress Notes (Deleted)
Office Visit Note  Patient: Angela Jimenez             Date of Birth: 1970/10/01           MRN: [id number]             PCP: Lyman Bishop, DO Referring: Charlies Silvers, MD Visit Date: 01/23/2019 Occupation: @GUAROCC @  Subjective:  No chief complaint on file.   History of Present Illness: Angela Jimenez is a 48 y.o. female ***   Activities of Daily Living:  Patient reports morning stiffness for *** {minute/hour:19697}.   Patient {ACTIONS;DENIES/REPORTS:21021675::"Denies"} nocturnal pain.  Difficulty dressing/grooming: {ACTIONS;DENIES/REPORTS:21021675::"Denies"} Difficulty climbing stairs: {ACTIONS;DENIES/REPORTS:21021675::"Denies"} Difficulty getting out of chair: {ACTIONS;DENIES/REPORTS:21021675::"Denies"} Difficulty using hands for taps, buttons, cutlery, and/or writing: {ACTIONS;DENIES/REPORTS:21021675::"Denies"}  No Rheumatology ROS completed.   PMFS History:  Patient Active Problem List   Diagnosis Date Noted  . Seasonal allergic conjunctivitis 11/27/2018  . Cough 07/06/2018  . Acute non-recurrent maxillary sinusitis 06/13/2018  . Other allergic rhinitis 06/04/2018  . History of uterine leiomyoma 06/04/2018  . Croup 05/28/2018  . Moderate persistent asthma with acute exacerbation 05/28/2018  . Bee sting-induced anaphylaxis 05/28/2018  . History of hypothyroidism 02/15/2018  . Multiple thyroid nodules 02/15/2018  . ERRONEOUS ENCOUNTER--DISREGARD 02/13/2018  . Uterine leiomyoma 01/01/2018  . Seasonal and perennial allergic rhinitis 09/18/2017  . Menometrorrhagia 09/08/2017  . Iron malabsorption 09/08/2017  . Iron deficiency anemia due to chronic blood loss 09/07/2017  . Chronic bilateral thoracic back pain 07/18/2017  . Neck pain 07/18/2017  . Symptomatic mammary hypertrophy 07/18/2017  . Acute bronchitis due to other specified organisms 06/27/2016  . Mild persistent asthma 10/13/2015  . Moderate persistent asthma without complication [id number]  .  Allergic rhinitis due to pollen 04/09/2015  . Gastroesophageal reflux disease without esophagitis 04/09/2015  . Anaphylactic shock due to adverse food reaction 04/09/2015    Past Medical History:  Diagnosis Date  . Asthma   . Bulging lumbar disc    between c3 and c4   . Iron malabsorption 09/08/2017  . Menometrorrhagia 09/08/2017    Family History  Problem Relation Age of Onset  . Allergic rhinitis Neg Hx   . Angioedema Neg Hx   . Asthma Neg Hx   . Eczema Neg Hx   . Immunodeficiency Neg Hx   . Urticaria Neg Hx    Past Surgical History:  Procedure Laterality Date  . MYOMECTOMY ABDOMINAL APPROACH     Social History   Social History Narrative  . Not on file    There is no immunization history on file for this patient.   Objective: Vital Signs: There were no vitals taken for this visit.   Physical Exam   Musculoskeletal Exam: ***  CDAI Exam: CDAI Score: - Patient Global: -; Provider Global: - Swollen: -; Tender: - Joint Exam   No joint exam has been documented for this visit   There is currently no information documented on the homunculus. Go to the Rheumatology activity and complete the homunculus joint exam.  Investigation: No additional findings.  Imaging: No results found.  Recent Labs: Lab Results  Component Value Date   WBC 3.4 (L) 11/15/2018   HGB 13.9 11/15/2018   PLT 227 11/15/2018   NA 138 11/15/2018   K 3.7 11/15/2018   CL 101 11/15/2018   CO2 31 11/15/2018   GLUCOSE 93 11/15/2018   BUN 10 11/15/2018   CREATININE 0.90 11/15/2018   BILITOT 0.5 11/15/2018   ALKPHOS 56 11/15/2018  AST 16 11/15/2018   ALT 15 11/15/2018   PROT 6.7 11/15/2018   ALBUMIN 4.4 11/15/2018   CALCIUM 9.4 11/15/2018   GFRAA >60 11/15/2018    Speciality Comments: No specialty comments available.  Procedures:  No procedures performed Allergies: Amoxicillin, Ciprofloxacin, Molds & smuts, Other, Peanut-containing drug products, Penicillins, Wheat bran,  Corn-containing products, Latex, Prednisone, and Sulfa antibiotics   Assessment / Plan:     Visit Diagnoses: No diagnosis found.  Orders: No orders of the defined types were placed in this encounter.  No orders of the defined types were placed in this encounter.   Face-to-face time spent with patient was *** minutes. Greater than 50% of time was spent in counseling and coordination of care.  Follow-Up Instructions: No follow-ups on file.   Ofilia Neas, PA-C  Note - This record has been created using Dragon software.  Chart creation errors have been sought, but may not always  have been located. Such creation errors do not reflect on  the standard of medical care.

## 2019-01-19 DIAGNOSIS — R52 Pain, unspecified: Secondary | ICD-10-CM | POA: Diagnosis not present

## 2019-01-22 DIAGNOSIS — M545 Low back pain: Secondary | ICD-10-CM | POA: Diagnosis not present

## 2019-01-22 DIAGNOSIS — M791 Myalgia, unspecified site: Secondary | ICD-10-CM | POA: Diagnosis not present

## 2019-01-22 DIAGNOSIS — M79644 Pain in right finger(s): Secondary | ICD-10-CM | POA: Diagnosis not present

## 2019-01-22 DIAGNOSIS — M542 Cervicalgia: Secondary | ICD-10-CM | POA: Diagnosis not present

## 2019-01-22 DIAGNOSIS — M25531 Pain in right wrist: Secondary | ICD-10-CM | POA: Diagnosis not present

## 2019-01-23 ENCOUNTER — Ambulatory Visit: Payer: PPO | Admitting: Rheumatology

## 2019-01-24 ENCOUNTER — Other Ambulatory Visit: Payer: Self-pay

## 2019-01-24 ENCOUNTER — Encounter: Payer: Self-pay | Admitting: Family Medicine

## 2019-01-24 ENCOUNTER — Ambulatory Visit (INDEPENDENT_AMBULATORY_CARE_PROVIDER_SITE_OTHER): Payer: PPO | Admitting: Family Medicine

## 2019-01-24 VITALS — BP 114/70 | HR 85 | Temp 98.6°F | Resp 24

## 2019-01-24 DIAGNOSIS — H1010 Acute atopic conjunctivitis, unspecified eye: Secondary | ICD-10-CM | POA: Diagnosis not present

## 2019-01-24 DIAGNOSIS — J302 Other seasonal allergic rhinitis: Secondary | ICD-10-CM

## 2019-01-24 DIAGNOSIS — K219 Gastro-esophageal reflux disease without esophagitis: Secondary | ICD-10-CM | POA: Diagnosis not present

## 2019-01-24 DIAGNOSIS — J4540 Moderate persistent asthma, uncomplicated: Secondary | ICD-10-CM

## 2019-01-24 DIAGNOSIS — T7800XD Anaphylactic reaction due to unspecified food, subsequent encounter: Secondary | ICD-10-CM

## 2019-01-24 DIAGNOSIS — K9049 Malabsorption due to intolerance, not elsewhere classified: Secondary | ICD-10-CM

## 2019-01-24 DIAGNOSIS — J3089 Other allergic rhinitis: Secondary | ICD-10-CM | POA: Diagnosis not present

## 2019-01-24 MED ORDER — ALBUTEROL SULFATE HFA 108 (90 BASE) MCG/ACT IN AERS: INHALATION_SPRAY | RESPIRATORY_TRACT | 0 refills | Status: DC

## 2019-01-24 MED ORDER — BUDESONIDE-FORMOTEROL FUMARATE 160-4.5 MCG/ACT IN AERO: INHALATION_SPRAY | RESPIRATORY_TRACT | 5 refills | Status: DC

## 2019-01-24 MED ORDER — MONTELUKAST SODIUM 10 MG PO TABS: 10.0000 mg | ORAL_TABLET | Freq: Every day | ORAL | 2 refills | Status: DC

## 2019-01-24 NOTE — Progress Notes (Addendum)
[place] [address] Dept: [phone]  FOLLOW UP NOTE  Patient ID: Angela Jimenez, female    DOB: 12-11-1970  Age: 48 y.o. MRN: [id number] Date of Office Visit: 01/24/2019  Assessment  Chief Complaint: Asthma  HPI Angela Jimenez is a 48 year old female who presents to the clinic for a follow up visit. She was last seen in this clinic on 11/27/2018 by Gareth Morgan, FNP for evaluation of asthma, allergic rhinitis, reflux, and food allergy. At today's visit, she reports her asthma has been moderately well controlled with occasional shortness of breath with increased humidity. She reports that she misplaced her Symbicort inhaler for several days and has recently started using this. She denies cough and wheeze with activity and rest. She is currently taking montelukast 10 mg once a day, Symbicort 160-2 puffs with a spacer on most days, and albuterol once a day on a regular schedule. Allergic rhinitis is reported as moderately well controlled with "sinus inflammation" for which she is taking Xyzal once a day. She is not currently using Flonase or sinus rinses. Allergic conjunctivitis is reported as moderately well controlled with Pazeo as needed. She is currently avoiding peanuts, tree nuts, wheat, and corn. She reports that she frequently experiences gas, frequent burping, and abdominal discomfort with  a mix of constipation and diarrhea. She reports that esomeprazole did not improve her abdominal issues and has stopped taking this medication. She reports a significant increase in her anxiety level over the last several months. Her current medications are listed in the chart.   Drug Allergies:  Allergies  Allergen Reactions  . Amoxicillin Anaphylaxis  . Ciprofloxacin Anaphylaxis       . Molds & Smuts Hives and Shortness Of Breath  . Other Anaphylaxis, Hives and Other (See Comments)    Tree nuts, anaphylaxis and migraine headache  . Peanut-Containing Drug Products Anaphylaxis   . Penicillins Anaphylaxis  . Wheat Bran   . Corn-Containing Products Other (See Comments)    Bloated and gassy  . Latex Hives  . Prednisone Other (See Comments)    "Makes me numb and tingly" increases eye pressure.   . Sulfa Antibiotics Other (See Comments)    Other reaction(s): UNSPECIFIED    Physical Exam: BP 114/70   Pulse 85   Temp 98.6 F (37 C) (Oral)   Resp (!) 24   SpO2 97%    Physical Exam Vitals signs reviewed.  Constitutional:      Appearance: Normal appearance.  HENT:     Head: Normocephalic and atraumatic.     Nose:     Comments: Bilateral nares edematous and pale with clear nasal drainage noted. Pharynx normal. Ears normal. Eyes normal.    Mouth/Throat:     Pharynx: Oropharynx is clear.  Eyes:     Conjunctiva/sclera: Conjunctivae normal.  Neck:     Musculoskeletal: Normal range of motion and neck supple.  Cardiovascular:     Rate and Rhythm: Normal rate and regular rhythm.     Heart sounds: Normal heart sounds. No murmur.  Pulmonary:     Effort: Pulmonary effort is normal.     Breath sounds: Normal breath sounds.     Comments: Lungs clear to auscultation Musculoskeletal: Normal range of motion.  Skin:    General: Skin is warm and dry.  Neurological:     Mental Status: She is alert and oriented to person, place, and time.  Psychiatric:        Mood and  Affect: Mood normal.        Behavior: Behavior normal.        Thought Content: Thought content normal.        Judgment: Judgment normal.     Diagnostics: FVC 2.51, FEV1 2.15. Predicted FVC 2.70, predicted FEV1 2.18. Spirometry indicates normal ventilatory function.   Assessment and Plan: 1. Moderate persistent asthma without complication   2. Seasonal and perennial allergic rhinitis   3. Seasonal allergic conjunctivitis   4. Gastroesophageal reflux disease without esophagitis   5. Food intolerance   6. Anaphylactic shock due to food, subsequent encounter     Meds ordered this encounter   Medications  . budesonide-formoterol (SYMBICORT) 160-4.5 MCG/ACT inhaler    Sig: Two puffs every 12 hours to prevent cough or wheeze. Rinse, gargle and spit after use.    Dispense:  10.2 g    Refill:  5  . albuterol (VENTOLIN HFA) 108 (90 Base) MCG/ACT inhaler    Sig: INHALE 2 PUFFS INTO THE LUNGS EVERY 4 HOURS AS NEEDED FOR WHEEZING OR SHORTNESS OF BREATH    Dispense:  8.5 g    Refill:  0  . montelukast (SINGULAIR) 10 MG tablet    Sig: Take 1 tablet (10 mg total) by mouth at bedtime.    Dispense:  90 tablet    Refill:  2    DISPENSE 90 DAYS    Patient Instructions  Moderate persistent asthma without complication Increase Symbicort 160- to 2 puffs twice a day with a spacer to prevent cough and wheeze Continue montelukast 10 mg once a day to prevent cough and wheeze Continue albuterol as needed for cough or wheeze  Food intolerance Recommend referral to gastrointestinal specialist for evaluation of gas and bloating  Seasonal and perennial allergic rhinitis Begin Flonase 1 spray in each nostril once a day. Right nostril point the applicator out toward the right ear Continue Xyzal 5 mg once a day as needed for a runny nose or itch Continue allergen avoidance measures Begin nasal saline rinses. Use before medicated nasal sprays  Allergic conjunctivitis Stop Pataday and begin Pazeo one drop once a day as needed for red, itchy eyes  Gastroesophageal reflux disease without esophagitis Continue lifestyle modifications as listed below  Anaphylactic shock due to food, subsequent encounter Continue to avoid almond milk, peanuts, tree nuts, wheat, and corn products. If you are having an allergic reaction take Benadryl 50 mg and if you have life threatening symptoms inject with EpiPen 0.3 mg and call 911  Follow up in 3 mo or sooner if needed   Return in about 2 months (around 03/26/2019), or if symptoms worsen or fail to improve.    Thank you for the opportunity to care for this  patient.  Please do not hesitate to contact me with questions.  Gareth Morgan, FNP [facility]  ________________________________________________  I have provided oversight concerning [name]'s evaluation and treatment of this patient's health issues addressed during today's encounter.  I agree with the assessment and therapeutic plan as outlined in the note.   Signed,   R Edgar Frisk, MD

## 2019-01-24 NOTE — Patient Instructions (Addendum)
Moderate persistent asthma without complication Increase Symbicort 160-to 2 puffs twice a day with a spacer to prevent cough and wheeze Continue montelukast 10 mg once a day to prevent cough and wheeze Continue albuterol as needed for cough or wheeze  Food intolerance Recommend referral to gastrointestinal specialist for evaluation of gas and bloating  Seasonal and perennial allergic rhinitis Begin Flonase 1 spray in each nostril once a day. Right nostril point the applicator out toward the right ear Continue Xyzal 5 mg once a day as needed for a runny nose or itch Continue allergen avoidance measures Begin nasal saline rinses. Use before medicated nasal sprays  Allergic conjunctivitis Stop Pataday and begin Pazeo one drop once a day as needed for red, itchy eyes  Gastroesophageal reflux disease without esophagitis Continue lifestyle modifications as listed below  Anaphylactic shock due to food, subsequent encounter Continue to avoid almond milk, peanuts, tree nuts, wheat, and corn products. If you are having an allergic reaction take Benadryl 50 mg and if you have life threatening symptoms inject with EpiPen 0.3 mg and call 911  Follow up in 3 mo or sooner if needed   Lifestyle Changes for Controlling GERD When you have GERD, stomach acid feels as if it's backing up toward your mouth. Whether or not you take medication to control your GERD, your symptoms can often be improved with lifestyle changes.   Raise Your Head  Reflux is more likely to strike when you're lying down flat, because stomach fluid can  flow backward more easily. Raising the head of your bed 4-6 inches can help. To do this:  Slide blocks or books under the legs at the head of your bed. Or, place a wedge under  the mattress. Many foam stores can make a suitable wedge for you. The wedge  should run from your waist to the top of your head.  Don't just prop your head on several pillows. This increases  pressure on your  stomach. It can make GERD worse.  Watch Your Eating Habits Certain foods may increase the acid in your stomach or relax the lower esophageal sphincter, making GERD more likely. It's best to avoid the following:  Coffee, tea, and carbonated drinks (with and without caffeine)  Fatty, fried, or spicy food  Mint, chocolate, onions, and tomatoes  Any other foods that seem to irritate your stomach or cause you pain  Relieve the Pressure  Eat smaller meals, even if you have to eat more often.  Don't lie down right after you eat. Wait a few hours for your stomach to empty.  Avoid tight belts and tight-fitting clothes.  Lose excess weight.  Tobacco and Alcohol  Avoid smoking tobacco and drinking alcohol. They can make GERD symptoms worse.

## 2019-01-31 DIAGNOSIS — M2550 Pain in unspecified joint: Secondary | ICD-10-CM | POA: Diagnosis not present

## 2019-01-31 DIAGNOSIS — Z6824 Body mass index (BMI) 24.0-24.9, adult: Secondary | ICD-10-CM | POA: Diagnosis not present

## 2019-02-05 DIAGNOSIS — M79644 Pain in right finger(s): Secondary | ICD-10-CM | POA: Diagnosis not present

## 2019-02-05 DIAGNOSIS — M25531 Pain in right wrist: Secondary | ICD-10-CM | POA: Diagnosis not present

## 2019-02-05 DIAGNOSIS — M545 Low back pain: Secondary | ICD-10-CM | POA: Diagnosis not present

## 2019-02-05 DIAGNOSIS — M791 Myalgia, unspecified site: Secondary | ICD-10-CM | POA: Diagnosis not present

## 2019-02-05 DIAGNOSIS — M542 Cervicalgia: Secondary | ICD-10-CM | POA: Diagnosis not present

## 2019-02-13 DIAGNOSIS — M542 Cervicalgia: Secondary | ICD-10-CM | POA: Diagnosis not present

## 2019-02-13 DIAGNOSIS — M545 Low back pain: Secondary | ICD-10-CM | POA: Diagnosis not present

## 2019-02-13 DIAGNOSIS — M797 Fibromyalgia: Secondary | ICD-10-CM | POA: Diagnosis not present

## 2019-02-15 ENCOUNTER — Other Ambulatory Visit: Payer: Self-pay

## 2019-02-15 ENCOUNTER — Inpatient Hospital Stay: Payer: PPO

## 2019-02-15 ENCOUNTER — Encounter: Payer: Self-pay | Admitting: *Deleted

## 2019-02-15 ENCOUNTER — Inpatient Hospital Stay: Payer: PPO | Attending: Hematology & Oncology | Admitting: Family

## 2019-02-15 ENCOUNTER — Telehealth: Payer: Self-pay | Admitting: Family

## 2019-02-15 ENCOUNTER — Encounter: Payer: Self-pay | Admitting: Family

## 2019-02-15 VITALS — BP 120/81 | HR 78 | Temp 97.0°F | Resp 18 | Wt 134.0 lb

## 2019-02-15 DIAGNOSIS — R002 Palpitations: Secondary | ICD-10-CM | POA: Insufficient documentation

## 2019-02-15 DIAGNOSIS — D500 Iron deficiency anemia secondary to blood loss (chronic): Secondary | ICD-10-CM | POA: Diagnosis not present

## 2019-02-15 DIAGNOSIS — R5383 Other fatigue: Secondary | ICD-10-CM | POA: Diagnosis not present

## 2019-02-15 DIAGNOSIS — D509 Iron deficiency anemia, unspecified: Secondary | ICD-10-CM | POA: Diagnosis not present

## 2019-02-15 DIAGNOSIS — K909 Intestinal malabsorption, unspecified: Secondary | ICD-10-CM

## 2019-02-15 DIAGNOSIS — K219 Gastro-esophageal reflux disease without esophagitis: Secondary | ICD-10-CM

## 2019-02-15 LAB — CBC WITH DIFFERENTIAL (CANCER CENTER ONLY)
Abs Immature Granulocytes: 0.02 10*3/uL (ref 0.00–0.07)
Basophils Absolute: 0 10*3/uL (ref 0.0–0.1)
Basophils Relative: 1 %
Eosinophils Absolute: 0.1 10*3/uL (ref 0.0–0.5)
Eosinophils Relative: 2 %
HCT: 40.8 % (ref 36.0–46.0)
Hemoglobin: 13.4 g/dL (ref 12.0–15.0)
Immature Granulocytes: 1 %
Lymphocytes Relative: 49 %
Lymphs Abs: 1.9 10*3/uL (ref 0.7–4.0)
MCH: 30.6 pg (ref 26.0–34.0)
MCHC: 32.8 g/dL (ref 30.0–36.0)
MCV: 93.2 fL (ref 80.0–100.0)
Monocytes Absolute: 0.2 10*3/uL (ref 0.1–1.0)
Monocytes Relative: 6 %
Neutro Abs: 1.6 10*3/uL — ABNORMAL LOW (ref 1.7–7.7)
Neutrophils Relative %: 41 %
Platelet Count: 221 10*3/uL (ref 150–400)
RBC: 4.38 MIL/uL (ref 3.87–5.11)
RDW: 11.9 % (ref 11.5–15.5)
WBC Count: 3.8 10*3/uL — ABNORMAL LOW (ref 4.0–10.5)
nRBC: 0 % (ref 0.0–0.2)

## 2019-02-15 LAB — IRON AND TIBC
Iron: 80 ug/dL (ref 41–142)
Saturation Ratios: 26 % (ref 21–57)
TIBC: 305 ug/dL (ref 236–444)
UIBC: 225 ug/dL (ref 120–384)

## 2019-02-15 LAB — CMP (CANCER CENTER ONLY)
ALT: 10 U/L (ref 0–44)
AST: 13 U/L — ABNORMAL LOW (ref 15–41)
Albumin: 4.6 g/dL (ref 3.5–5.0)
Alkaline Phosphatase: 47 U/L (ref 38–126)
Anion gap: 8 (ref 5–15)
BUN: 16 mg/dL (ref 6–20)
CO2: 30 mmol/L (ref 22–32)
Calcium: 9.4 mg/dL (ref 8.9–10.3)
Chloride: 103 mmol/L (ref 98–111)
Creatinine: 0.94 mg/dL (ref 0.44–1.00)
GFR, Est AFR Am: >60 mL/min (ref >60)
GFR, Estimated: >60 mL/min (ref >60)
Glucose, Bld: 89 mg/dL (ref 70–99)
Potassium: 3.6 mmol/L (ref 3.5–5.1)
Sodium: 141 mmol/L (ref 135–145)
Total Bilirubin: 0.4 mg/dL (ref 0.3–1.2)
Total Protein: 7.1 g/dL (ref 6.5–8.1)

## 2019-02-15 LAB — FERRITIN: Ferritin: 79 ng/mL (ref 11–307)

## 2019-02-15 NOTE — Progress Notes (Signed)
Hematology and Oncology Follow Up Visit  Angela Jimenez [id number] 03/12/1971 48 y.o. 02/15/2019   Principle Diagnosis:  Iron deficiency anemia secondary to malabsorptionand heavy cycles  Current Therapy:   Iv iron as indicated    Interim History:  Angela Jimenez is here today for follow-up. She is having some mild fatigue at times as well as palpitations.  She states that she has a nerve impingement in her neck that has caused trigger finger in both hands. She states that this is painful and is wearing braces on both wrists.  Her cycle is irregular but flow not heavy. No other blood loss noted. No petechiae.  She takes Gabriel Earing powders often for headaches and does bruise easily. She is aware of being careful not to take too much of this.  No fever, chills, n/v, cough, rash, dizziness, SB, chest pain, palpitations, abdominal pain or changes in bowel or bladder habits.  No swelling, numbness or tingling in her extremities.  She has maintained a good appetite and is staying well hydrated. Her weight is stable.   ECOG Performance Status: 1 - Symptomatic but completely ambulatory  Medications:  Allergies as of 02/15/2019      Reactions   Amoxicillin Anaphylaxis   Ciprofloxacin Anaphylaxis      Molds & Smuts Hives, Shortness Of Breath   Other Anaphylaxis, Hives, Other (See Comments)   Tree nuts, anaphylaxis and migraine headache   Peanut-containing Drug Products Anaphylaxis   Penicillins Anaphylaxis   Wheat Bran    Corn-containing Products Other (See Comments)   Bloated and gassy   Latex Hives   Prednisone Other (See Comments)   "Makes me numb and tingly" increases eye pressure.    Sulfa Antibiotics Other (See Comments)   Other reaction(s): UNSPECIFIED      Medication List       Accurate as of February 15, 2019 10:47 AM. If you have any questions, ask your nurse or doctor.        albuterol 108 (90 Base) MCG/ACT inhaler Commonly known as: VENTOLIN HFA INHALE 2 PUFFS INTO THE  LUNGS EVERY 4 HOURS AS NEEDED FOR WHEEZING OR SHORTNESS OF BREATH   budesonide-formoterol 160-4.5 MCG/ACT inhaler Commonly known as: Symbicort Two puffs every 12 hours to prevent cough or wheeze. Rinse, gargle and spit after use.   cyclobenzaprine 10 MG tablet Commonly known as: FLEXERIL Take 10 mg by mouth as needed for muscle spasms.   EPINEPHrine 0.3 mg/0.3 mL Soaj injection Commonly known as: EPI-PEN INJECT IN THE MUSCLE AS DIRECTED FOR SEVERE ALLERGIC REACTIONS   fluticasone 50 MCG/ACT nasal spray Commonly known as: FLONASE SHAKE LIQUID AND USE 1 SPRAY IN EACH NOSTRIL DAILY What changed: See the new instructions.   GAS-X PO Take by mouth as needed.   LESSINA-28 0.1-20 MG-MCG tablet Generic drug: levonorgestrel-ethinyl estradiol TK 1 T PO QD   levocetirizine 5 MG tablet Commonly known as: XYZAL TAKE 1 TABLET BY MOUTH EVERY EVENING   MIRALAX PO Take 1 Scoop by mouth daily.   montelukast 10 MG tablet Commonly known as: Singulair Take 1 tablet (10 mg total) by mouth at bedtime.   NON FORMULARY Take 500 mg by mouth daily. Fibrenza   NON FORMULARY Take 1,000 mg by mouth daily. Pumpkin seed   NON FORMULARY Take by mouth daily. Gluten Defense   NON FORMULARY Take 400 mg by mouth daily. Red Rasberry   Pazeo 0.7 % Soln Generic drug: Olopatadine HCl Place 1 drop into both eyes daily as needed (for  red itchy eyes).   promethazine 12.5 MG tablet Commonly known as: PHENERGAN Take 12.5 mg by mouth every 4 (four) hours as needed.   SAW PALMETTO PO Take 580 mg by mouth daily.   Sudafed 30 MG tablet Generic drug: pseudoephedrine Take 30 mg by mouth every 4 (four) hours as needed for congestion.   traMADol 50 MG tablet Commonly known as: ULTRAM Take by mouth as needed.   VITAMIN D3 PO Take 2,000 Units by mouth.       Allergies:  Allergies  Allergen Reactions  . Amoxicillin Anaphylaxis  . Ciprofloxacin Anaphylaxis       . Molds & Smuts Hives and  Shortness Of Breath  . Other Anaphylaxis, Hives and Other (See Comments)    Tree nuts, anaphylaxis and migraine headache  . Peanut-Containing Drug Products Anaphylaxis  . Penicillins Anaphylaxis  . Wheat Bran   . Corn-Containing Products Other (See Comments)    Bloated and gassy  . Latex Hives  . Prednisone Other (See Comments)    "Makes me numb and tingly" increases eye pressure.   . Sulfa Antibiotics Other (See Comments)    Other reaction(s): UNSPECIFIED    Past Medical History, Surgical history, Social history, and Family History were reviewed and updated.  Review of Systems: All other 10 point review of systems is negative.   Physical Exam:  vitals were not taken for this visit.   Wt Readings from Last 3 Encounters:  11/15/18 134 lb (60.8 kg)  07/17/18 128 lb 1.9 oz (58.1 kg)  05/28/18 125 lb 6.4 oz (56.9 kg)    Ocular: Sclerae unicteric, pupils equal, round and reactive to light Ear-nose-throat: Oropharynx clear, dentition fair Lymphatic: No cervical or supraclavicular adenopathy Lungs no rales or rhonchi, good excursion bilaterally Heart regular rate and rhythm, no murmur appreciated Abd soft, nontender, positive bowel sounds., no liver or spleen tip palpated on exam, no fluid wave  MSK no focal spinal tenderness, no joint edema Neuro: non-focal, well-oriented, appropriate affect Breasts: Deferred   Lab Results  Component Value Date   WBC 3.8 (L) 02/15/2019   HGB 13.4 02/15/2019   HCT 40.8 02/15/2019   MCV 93.2 02/15/2019   PLT 221 02/15/2019   Lab Results  Component Value Date   FERRITIN 66 11/15/2018   IRON 154 (H) 11/15/2018   TIBC 338 11/15/2018   UIBC 183 11/15/2018   IRONPCTSAT 46 11/15/2018   Lab Results  Component Value Date   RETICCTPCT 0.8 11/15/2018   RBC 4.38 02/15/2019   No results found for: KPAFRELGTCHN, LAMBDASER, KAPLAMBRATIO No results found for: IGGSERUM, IGA, IGMSERUM No results found for: Odetta Pink, SPEI   Chemistry      Component Value Date/Time   NA 138 11/15/2018 1117   K 3.7 11/15/2018 1117   CL 101 11/15/2018 1117   CO2 31 11/15/2018 1117   BUN 10 11/15/2018 1117   CREATININE 0.90 11/15/2018 1117      Component Value Date/Time   CALCIUM 9.4 11/15/2018 1117   ALKPHOS 56 11/15/2018 1117   AST 16 11/15/2018 1117   ALT 15 11/15/2018 1117   BILITOT 0.5 11/15/2018 1117       Impression and Plan: Angela Jimenez is a very pleasant 48 yo African American female with iron deficiency anemia secondary to heavy cycles and malabsorption.  We will see what her iron studies show and bring her back in for infusion if needed.  We will plan to see her  back in another 4 months.  She will contact our office with any questions or concerns. We can certainly see her sooner if needed.   Laverna Peace, NP 9/25/202010:47 AM

## 2019-02-15 NOTE — Telephone Encounter (Signed)
Spoke with patient to confirm Jan appts per 9/25 LOS

## 2019-02-19 DIAGNOSIS — M791 Myalgia, unspecified site: Secondary | ICD-10-CM | POA: Diagnosis not present

## 2019-02-19 DIAGNOSIS — M79644 Pain in right finger(s): Secondary | ICD-10-CM | POA: Diagnosis not present

## 2019-02-19 DIAGNOSIS — M542 Cervicalgia: Secondary | ICD-10-CM | POA: Diagnosis not present

## 2019-02-19 DIAGNOSIS — M545 Low back pain: Secondary | ICD-10-CM | POA: Diagnosis not present

## 2019-02-19 DIAGNOSIS — M25531 Pain in right wrist: Secondary | ICD-10-CM | POA: Diagnosis not present

## 2019-02-25 ENCOUNTER — Telehealth: Payer: Self-pay | Admitting: *Deleted

## 2019-02-25 NOTE — Telephone Encounter (Signed)
Message received from patient requesting Ferritin results. Call placed back to patient and patient notified of Ferritin and iron results.  Pt states that she has had recent bruising to her hands and platelets and would like to know if this is caused by anything related to her iron.  Jory Ee NP notified.  Call placed back to patient and patient notified per order of [place] NP to contact her PCP regarding bruising concerns.  Pt appreciative of call back and has no further questions or concerns at this time.

## 2019-02-27 ENCOUNTER — Ambulatory Visit: Payer: PPO | Admitting: Rheumatology

## 2019-03-03 IMAGING — CR DG CHEST 2V
2 series · 2 of 2 positions shown · non-contrast
Comparison: PA and lateral chest x-ray October 14, 2014

CLINICAL DATA: One month history of cough and chest congestion
without fever. Patient reports history of asthma. Currently having
sinus drainage. Nonsmoker.

EXAM:
CHEST  2 VIEW

[w chest pa]
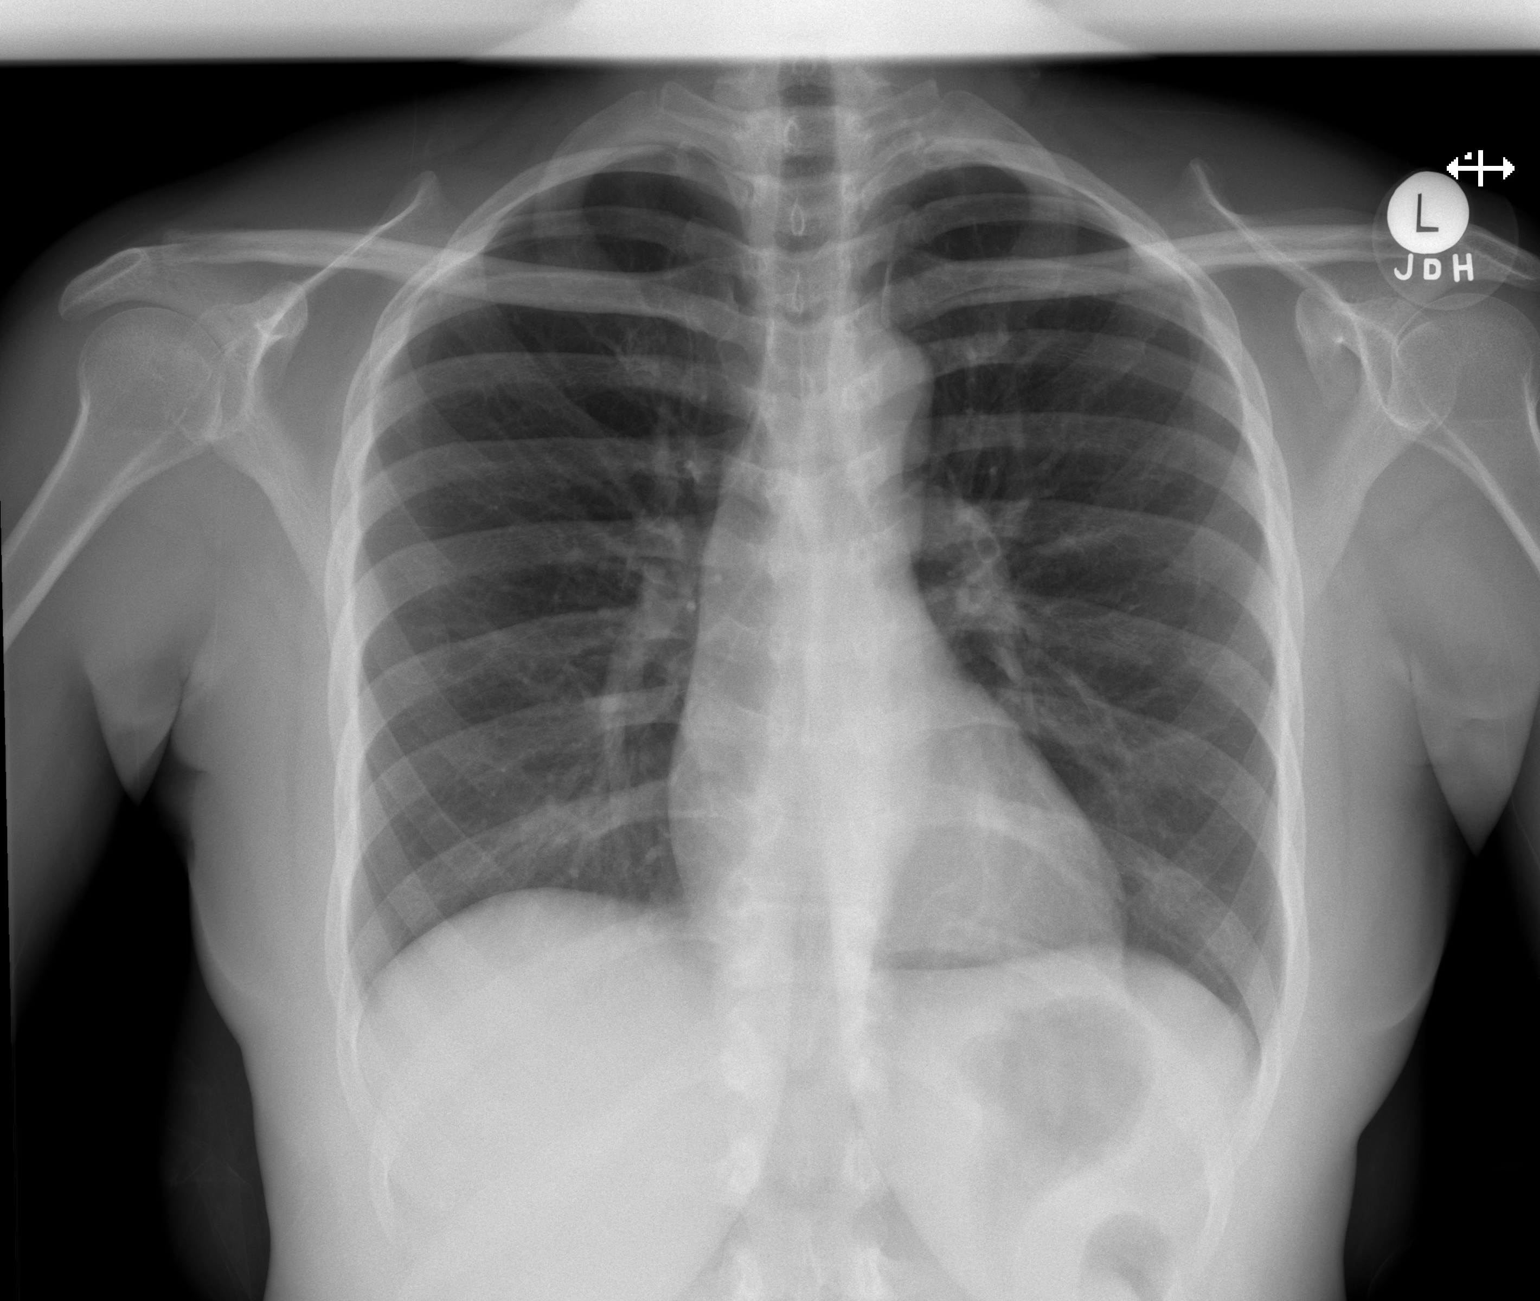

[w chest lat]
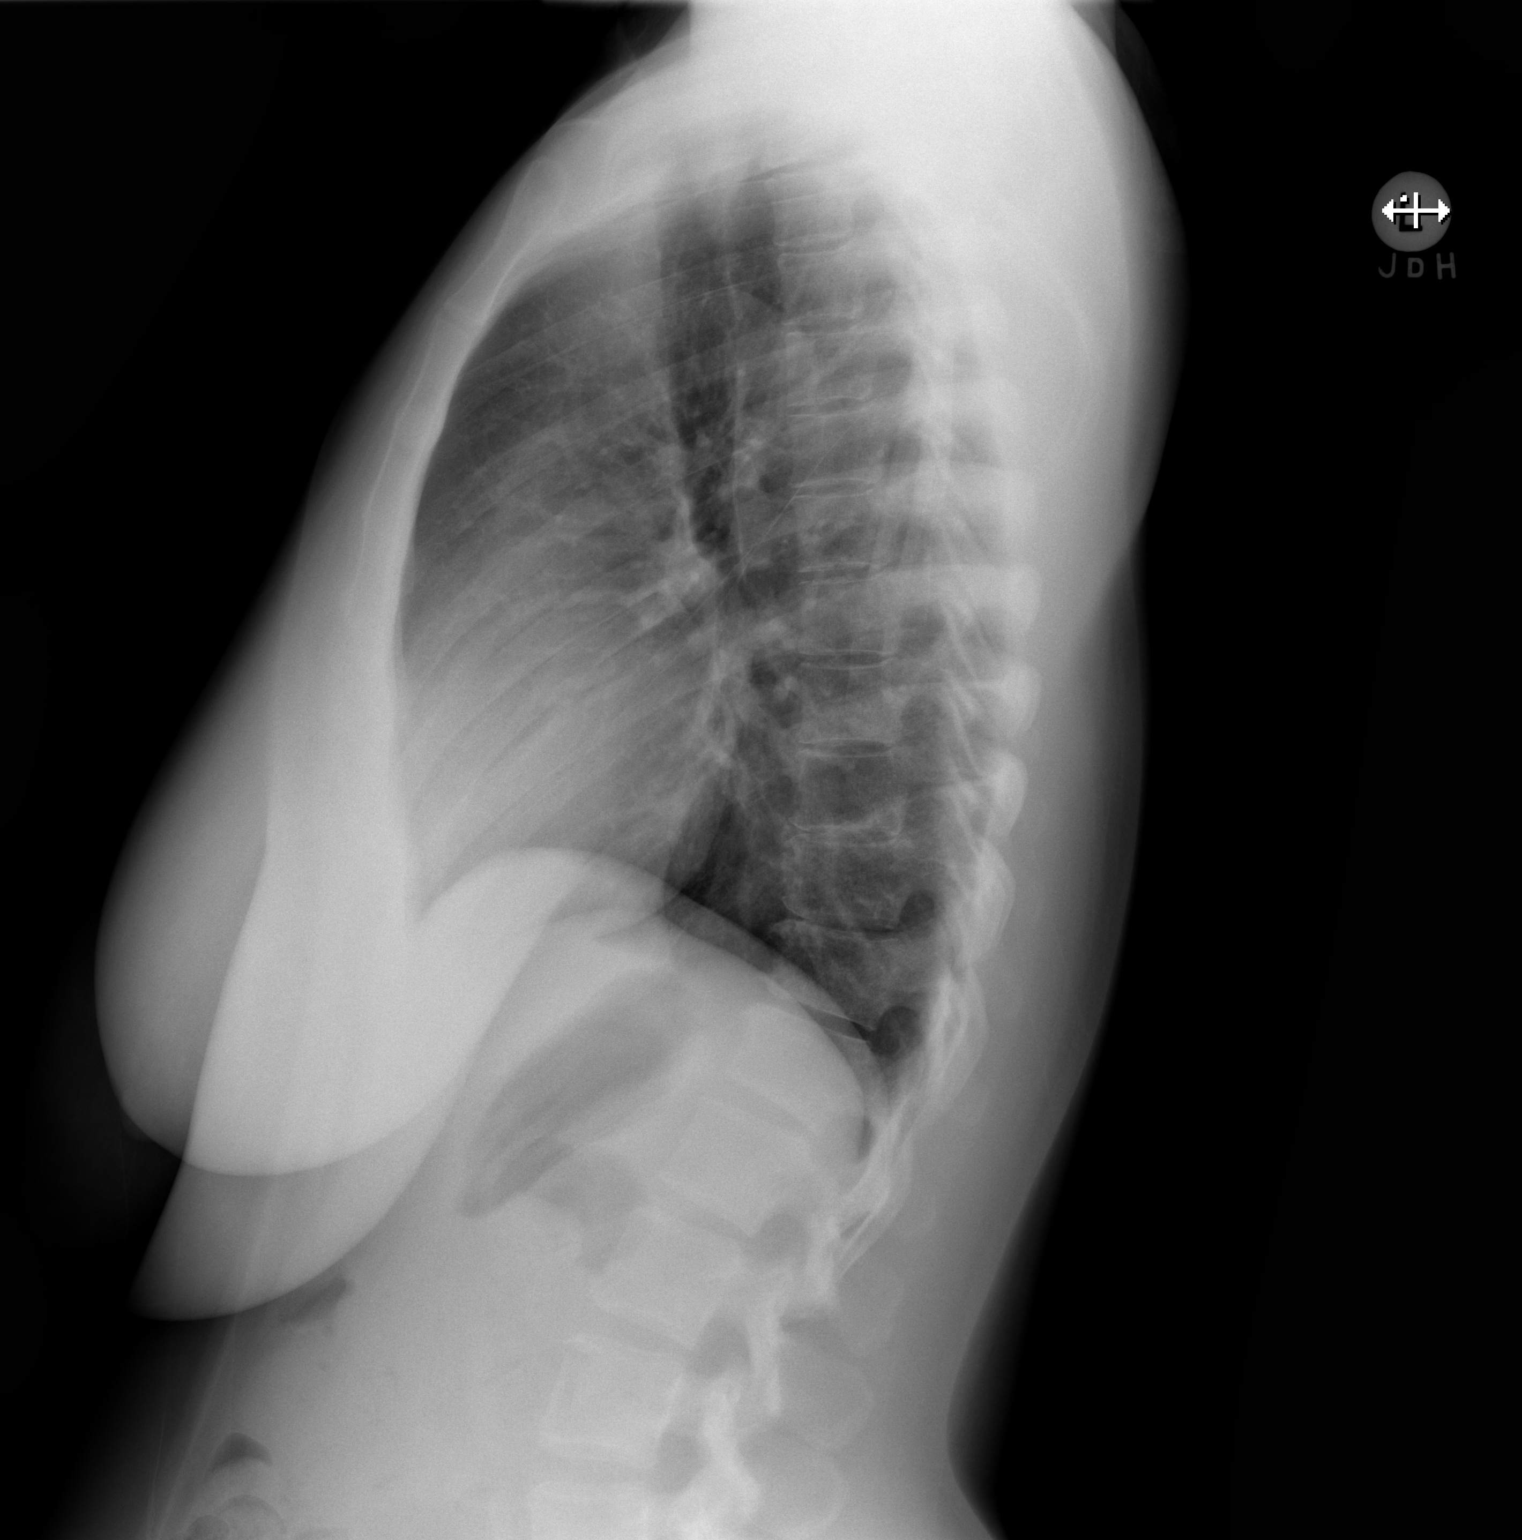

[2 of 2 positions shown; findings below may reference images not displayed]

FINDINGS: The lungs are mildly hyperinflated. There is no focal infiltrate.
There is no pleural effusion. The heart and pulmonary vascularity
are normal. The mediastinum is normal in width. The trachea is
midline. The bony thorax exhibits no acute abnormality.
IMPRESSION: Mild hyperinflation consistent with the history of asthma. There is
no pneumonia nor other acute cardiopulmonary abnormality.

## 2019-03-04 ENCOUNTER — Ambulatory Visit (INDEPENDENT_AMBULATORY_CARE_PROVIDER_SITE_OTHER): Payer: PPO | Admitting: Gastroenterology

## 2019-03-04 ENCOUNTER — Other Ambulatory Visit: Payer: Self-pay

## 2019-03-04 ENCOUNTER — Encounter: Payer: Self-pay | Admitting: Gastroenterology

## 2019-03-04 ENCOUNTER — Encounter

## 2019-03-04 VITALS — BP 123/81 | HR 83 | Temp 97.8°F | Ht 62.0 in | Wt 136.2 lb

## 2019-03-04 DIAGNOSIS — R109 Unspecified abdominal pain: Secondary | ICD-10-CM | POA: Diagnosis not present

## 2019-03-04 DIAGNOSIS — R1013 Epigastric pain: Secondary | ICD-10-CM

## 2019-03-04 DIAGNOSIS — R140 Abdominal distension (gaseous): Secondary | ICD-10-CM | POA: Diagnosis not present

## 2019-03-04 NOTE — Progress Notes (Signed)
Gastroenterology Consultation  Referring Provider:     Lyman Bishop, DO Primary Care Physician:  Lyman Bishop, DO Primary Gastroenterologist:  Dr. Allen Norris     Reason for Consultation:     Bloating and gas        HPI:   Angela Jimenez is a 48 y.o. y/o female referred for consultation & management of bloating and gas by Dr. Curly Rim, Adele Barthel, DO.  This patient comes in today with a long history of having food allergies.  The patient states that she is being followed by an allergy specialist for her multiple food allergies.  Her biggest concern is that she is allergic to corn and many medications and foods that she would not think are derived from corn actually are.  The patient has a reaction that she reports to be a lot of burping and bloating after eating anything with corn in it.  She also seems quite upset because many medications that have been given to her by different physicians have contained corn products. The patient denies any constipation and states that she moves her bowels usually once a day.  She also denies any unexplained weight loss fevers chills nausea or vomiting.  She reports that whenever she is eating something that she is allergic to it results in her having a lot of burping.  She does drink with a straw and drinks carbonated drinks on occasion.  She denies chewing gum or any overt paraphasia inducing activities such as eating quickly.  Past Medical History:  Diagnosis Date  . Asthma   . Bulging lumbar disc    between c3 and c4   . Iron malabsorption 09/08/2017  . Menometrorrhagia 09/08/2017  . Trigger finger 2020    Past Surgical History:  Procedure Laterality Date  . MYOMECTOMY ABDOMINAL APPROACH      Prior to Admission medications   Medication Sig Start Date End Date Taking? Authorizing Provider  albuterol (VENTOLIN HFA) 108 (90 Base) MCG/ACT inhaler INHALE 2 PUFFS INTO THE LUNGS EVERY 4 HOURS AS NEEDED FOR WHEEZING OR SHORTNESS OF BREATH 01/24/19   Yes Ambs, Kathrine Cords, FNP  budesonide-formoterol (SYMBICORT) 160-4.5 MCG/ACT inhaler Two puffs every 12 hours to prevent cough or wheeze. Rinse, gargle and spit after use. 01/24/19  Yes Ambs, Kathrine Cords, FNP  Cholecalciferol (VITAMIN D3 PO) Take 2,000 Units by mouth.    Yes [provider]  citalopram (CELEXA) 20 MG tablet TK 1 T PO D 02/14/19  Yes [provider]  cyclobenzaprine (FLEXERIL) 10 MG tablet Take 10 mg by mouth as needed for muscle spasms.   Yes [provider]  EPINEPHrine 0.3 mg/0.3 mL IJ SOAJ injection INJECT IN THE MUSCLE AS DIRECTED FOR SEVERE ALLERGIC REACTIONS 08/21/18  Yes Ambs, Kathrine Cords, FNP  fluticasone (FLONASE) 50 MCG/ACT nasal spray SHAKE LIQUID AND USE 1 SPRAY IN EACH NOSTRIL DAILY Patient taking differently: Place 1 spray into both nostrils daily as needed.  08/21/18  Yes Ambs, Kathrine Cords, FNP  LESSINA-28 0.1-20 MG-MCG tablet TK 1 T PO QD 02/19/18  Yes [provider]  levocetirizine (XYZAL) 5 MG tablet TAKE 1 TABLET BY MOUTH EVERY EVENING 07/09/18  Yes Ambs, Kathrine Cords, FNP  montelukast (SINGULAIR) 10 MG tablet Take 1 tablet (10 mg total) by mouth at bedtime. 01/24/19  Yes Ambs, Kathrine Cords, FNP  NON FORMULARY Take 500 mg by mouth daily. Fibrenza   Yes [provider]  NON FORMULARY Take 1,000 mg by mouth daily. Pumpkin  seed   Yes [provider]  NON FORMULARY Take by mouth daily. Gluten Defense   Yes [provider]  NON FORMULARY Take 400 mg by mouth daily. Red Rasberry   Yes [provider]  Olopatadine HCl (PAZEO) 0.7 % SOLN Place 1 drop into both eyes daily as needed (for red itchy eyes). 11/27/18  Yes Ambs, Kathrine Cords, FNP  Polyethylene Glycol 3350 (MIRALAX PO) Take 1 Scoop by mouth daily.    Yes [provider]  pseudoephedrine (SUDAFED) 30 MG tablet Take 30 mg by mouth every 4 (four) hours as needed for congestion.   Yes [provider]  Saw Palmetto, Serenoa repens, (SAW PALMETTO PO) Take 580 mg by mouth  daily.   Yes [provider]  Simethicone (GAS-X PO) Take by mouth as needed.   Yes [provider]  traMADol (ULTRAM) 50 MG tablet Take by mouth as needed.   Yes [provider]  promethazine (PHENERGAN) 12.5 MG tablet Take 12.5 mg by mouth every 4 (four) hours as needed.  07/04/18 07/11/18  [provider]    Family History  Problem Relation Age of Onset  . Allergic rhinitis Neg Hx   . Angioedema Neg Hx   . Asthma Neg Hx   . Eczema Neg Hx   . Immunodeficiency Neg Hx   . Urticaria Neg Hx      Social History   Tobacco Use  . Smoking status: Never Smoker  . Smokeless tobacco: Never Used  Substance Use Topics  . Alcohol use: No    Alcohol/week: 0.0 standard drinks  . Drug use: No    Allergies as of 03/04/2019 - Review Complete 03/04/2019  Allergen Reaction Noted  . Amoxicillin Anaphylaxis 05/21/2013  . Ciprofloxacin Anaphylaxis 02/05/2018  . Molds & smuts Hives and Shortness Of Breath 07/14/2017  . Other Anaphylaxis, Hives, and Other (See Comments) 04/09/2015  . Peanut-containing drug products Anaphylaxis 05/21/2013  . Penicillins Anaphylaxis 05/21/2013  . Wheat bran  06/27/2016  . Corn-containing products Other (See Comments) 06/27/2016  . Latex Hives 07/14/2017  . Prednisone Other (See Comments) 04/13/2017  . Sulfa antibiotics Other (See Comments) 07/17/2018    Review of Systems:    All systems reviewed and negative except where noted in HPI.   Physical Exam:  BP 123/81   Pulse 83   Temp 97.8 F (36.6 C) (Temporal)   Ht 5\' 2"  (1.575 m)   Wt 136 lb 3.2 oz (61.8 kg)   BMI 24.91 kg/m  No LMP recorded. General:   Alert,  Well-developed, well-nourished, pleasant and cooperative in NAD Head:  Normocephalic and atraumatic. Eyes:  Sclera clear, no icterus.   Conjunctiva pink. Ears:  Normal auditory acuity. Neck:  Supple; no masses or thyromegaly. Lungs:  Respirations even and unlabored.  Clear throughout to auscultation.   No  wheezes, crackles, or rhonchi. No acute distress. Heart:  Regular rate and rhythm; no murmurs, clicks, rubs, or gallops. Abdomen:  Normal bowel sounds.  No bruits.  Soft, non-tender and non-distended without masses, hepatosplenomegaly or hernias noted.  No guarding or rebound tenderness.  Negative Carnett sign.   Rectal:  Deferred.  Msk:  Symmetrical without gross deformities.  Good, equal movement & strength bilaterally. Pulses:  Normal pulses noted. Extremities:  No clubbing or edema.  No cyanosis. Neurologic:  Alert and oriented x3;  grossly normal neurologically. Skin:  Intact without significant lesions or rashes.  No jaundice. Lymph Nodes:  No significant cervical adenopathy. Psych:  Alert and  cooperative. Normal mood and affect.  Imaging Studies: No results found.  Assessment and Plan:   Angela Jimenez is a 48 y.o. y/o female who comes in today with a history of multiple food allergies and intolerance to many more foods.  The patient states that she has bloating with burping only when she has something that she is intolerant to.  This does not seem to be a chronic GI issue but more of a result of something she eats.  The patient has been told that she should avoid foods that are gassy.  The patient has also been told that she should try and avoid drinking with a straw, chewing gum, carbonated beverages and eating quickly to decrease the amount of swallowed air.  She has also been told that she should try some prune juice once a day to see if she can more completely evacuate her intestines thereby decreasing the buildup of any residual gas.  She has been also told that if this is a reaction to food and thinks she is intolerant/allergic to this would be better handled by her allergy specialist.  This visit consisted of 35 minutes face to face contact with myself and at least 60% of this time was spent in counseling and education regarding diagnosis, treatment options, medication management,  risks and benefits of treatment.  Lucilla Lame, MD. Marval Regal    Note: This dictation was prepared with Dragon dictation along with smaller phrase technology. Any transcriptional errors that result from this process are unintentional.

## 2019-03-05 DIAGNOSIS — M791 Myalgia, unspecified site: Secondary | ICD-10-CM | POA: Diagnosis not present

## 2019-03-05 DIAGNOSIS — M25531 Pain in right wrist: Secondary | ICD-10-CM | POA: Diagnosis not present

## 2019-03-05 DIAGNOSIS — M545 Low back pain: Secondary | ICD-10-CM | POA: Diagnosis not present

## 2019-03-05 DIAGNOSIS — M542 Cervicalgia: Secondary | ICD-10-CM | POA: Diagnosis not present

## 2019-03-05 DIAGNOSIS — M79644 Pain in right finger(s): Secondary | ICD-10-CM | POA: Diagnosis not present

## 2019-03-06 ENCOUNTER — Telehealth: Payer: Self-pay

## 2019-03-06 LAB — CELIAC DISEASE PANEL
Endomysial IgA: NEGATIVE
IgA/Immunoglobulin A, Serum: 163 mg/dL (ref 87–352)
Transglutaminase IgA: <2 U/mL (ref 0–3)

## 2019-03-06 NOTE — Telephone Encounter (Signed)
Pt notified of lab results

## 2019-03-06 NOTE — Telephone Encounter (Signed)
-----   Message from Lucilla Lame, MD sent at 03/06/2019  9:02 AM EDT ----- Let the patient know that her blood test for celiac sprue was negative and it does not appear she has an allergy to gluten.

## 2019-03-08 DIAGNOSIS — N898 Other specified noninflammatory disorders of vagina: Secondary | ICD-10-CM | POA: Diagnosis not present

## 2019-03-08 DIAGNOSIS — Z79899 Other long term (current) drug therapy: Secondary | ICD-10-CM | POA: Diagnosis not present

## 2019-03-08 DIAGNOSIS — R8290 Unspecified abnormal findings in urine: Secondary | ICD-10-CM | POA: Diagnosis not present

## 2019-03-08 DIAGNOSIS — Z2821 Immunization not carried out because of patient refusal: Secondary | ICD-10-CM | POA: Diagnosis not present

## 2019-03-11 DIAGNOSIS — B373 Candidiasis of vulva and vagina: Secondary | ICD-10-CM | POA: Diagnosis not present

## 2019-03-11 DIAGNOSIS — N390 Urinary tract infection, site not specified: Secondary | ICD-10-CM | POA: Diagnosis not present

## 2019-03-19 DIAGNOSIS — M542 Cervicalgia: Secondary | ICD-10-CM | POA: Diagnosis not present

## 2019-03-19 DIAGNOSIS — M25531 Pain in right wrist: Secondary | ICD-10-CM | POA: Diagnosis not present

## 2019-03-19 DIAGNOSIS — M791 Myalgia, unspecified site: Secondary | ICD-10-CM | POA: Diagnosis not present

## 2019-03-19 DIAGNOSIS — M545 Low back pain: Secondary | ICD-10-CM | POA: Diagnosis not present

## 2019-03-19 DIAGNOSIS — M79644 Pain in right finger(s): Secondary | ICD-10-CM | POA: Diagnosis not present

## 2019-04-01 ENCOUNTER — Ambulatory Visit: Payer: Self-pay | Admitting: Family Medicine

## 2019-04-01 DIAGNOSIS — N760 Acute vaginitis: Secondary | ICD-10-CM | POA: Diagnosis not present

## 2019-04-01 DIAGNOSIS — R350 Frequency of micturition: Secondary | ICD-10-CM | POA: Diagnosis not present

## 2019-04-02 DIAGNOSIS — M545 Low back pain: Secondary | ICD-10-CM | POA: Diagnosis not present

## 2019-04-02 DIAGNOSIS — M79644 Pain in right finger(s): Secondary | ICD-10-CM | POA: Diagnosis not present

## 2019-04-02 DIAGNOSIS — M25531 Pain in right wrist: Secondary | ICD-10-CM | POA: Diagnosis not present

## 2019-04-02 DIAGNOSIS — M542 Cervicalgia: Secondary | ICD-10-CM | POA: Diagnosis not present

## 2019-04-02 DIAGNOSIS — M791 Myalgia, unspecified site: Secondary | ICD-10-CM | POA: Diagnosis not present

## 2019-04-03 DIAGNOSIS — R002 Palpitations: Secondary | ICD-10-CM | POA: Insufficient documentation

## 2019-04-03 DIAGNOSIS — E611 Iron deficiency: Secondary | ICD-10-CM | POA: Insufficient documentation

## 2019-04-03 HISTORY — DX: Iron deficiency: E61.1

## 2019-04-04 DIAGNOSIS — R002 Palpitations: Secondary | ICD-10-CM | POA: Diagnosis not present

## 2019-04-04 DIAGNOSIS — R011 Cardiac murmur, unspecified: Secondary | ICD-10-CM | POA: Diagnosis not present

## 2019-04-08 DIAGNOSIS — M542 Cervicalgia: Secondary | ICD-10-CM | POA: Diagnosis not present

## 2019-04-08 DIAGNOSIS — G8929 Other chronic pain: Secondary | ICD-10-CM | POA: Diagnosis not present

## 2019-04-08 DIAGNOSIS — M545 Low back pain: Secondary | ICD-10-CM | POA: Diagnosis not present

## 2019-04-08 DIAGNOSIS — Z79899 Other long term (current) drug therapy: Secondary | ICD-10-CM | POA: Diagnosis not present

## 2019-04-10 DIAGNOSIS — M542 Cervicalgia: Secondary | ICD-10-CM | POA: Diagnosis not present

## 2019-04-15 DIAGNOSIS — M542 Cervicalgia: Secondary | ICD-10-CM | POA: Diagnosis not present

## 2019-04-16 DIAGNOSIS — M79644 Pain in right finger(s): Secondary | ICD-10-CM | POA: Diagnosis not present

## 2019-04-16 DIAGNOSIS — M542 Cervicalgia: Secondary | ICD-10-CM | POA: Diagnosis not present

## 2019-04-16 DIAGNOSIS — M545 Low back pain: Secondary | ICD-10-CM | POA: Diagnosis not present

## 2019-04-16 DIAGNOSIS — M791 Myalgia, unspecified site: Secondary | ICD-10-CM | POA: Diagnosis not present

## 2019-04-16 DIAGNOSIS — M25531 Pain in right wrist: Secondary | ICD-10-CM | POA: Diagnosis not present

## 2019-04-17 DIAGNOSIS — R309 Painful micturition, unspecified: Secondary | ICD-10-CM | POA: Diagnosis not present

## 2019-04-17 DIAGNOSIS — R350 Frequency of micturition: Secondary | ICD-10-CM | POA: Diagnosis not present

## 2019-04-17 DIAGNOSIS — R300 Dysuria: Secondary | ICD-10-CM | POA: Diagnosis not present

## 2019-04-17 DIAGNOSIS — R3914 Feeling of incomplete bladder emptying: Secondary | ICD-10-CM | POA: Diagnosis not present

## 2019-04-22 DIAGNOSIS — M47812 Spondylosis without myelopathy or radiculopathy, cervical region: Secondary | ICD-10-CM | POA: Diagnosis not present

## 2019-04-23 ENCOUNTER — Telehealth: Payer: Self-pay | Admitting: *Deleted

## 2019-04-23 MED ORDER — ALBUTEROL SULFATE (2.5 MG/3ML) 0.083% IN NEBU: 2.5000 mg | INHALATION_SOLUTION | RESPIRATORY_TRACT | 1 refills | Status: DC | PRN

## 2019-04-23 NOTE — Telephone Encounter (Signed)
Pt aware she needs ov- she will call back to schedule. She needed albuterol for her nebulizer sent in.

## 2019-04-24 DIAGNOSIS — M25531 Pain in right wrist: Secondary | ICD-10-CM | POA: Diagnosis not present

## 2019-04-24 DIAGNOSIS — M542 Cervicalgia: Secondary | ICD-10-CM | POA: Diagnosis not present

## 2019-04-24 DIAGNOSIS — M79644 Pain in right finger(s): Secondary | ICD-10-CM | POA: Diagnosis not present

## 2019-04-24 DIAGNOSIS — M791 Myalgia, unspecified site: Secondary | ICD-10-CM | POA: Diagnosis not present

## 2019-04-24 DIAGNOSIS — M545 Low back pain: Secondary | ICD-10-CM | POA: Diagnosis not present

## 2019-04-25 ENCOUNTER — Other Ambulatory Visit: Payer: Self-pay

## 2019-04-25 ENCOUNTER — Emergency Department (HOSPITAL_COMMUNITY)
Admission: EM | Admit: 2019-04-25 | Discharge: 2019-04-25 | Payer: PPO | Attending: Emergency Medicine | Admitting: Emergency Medicine

## 2019-04-25 ENCOUNTER — Encounter (HOSPITAL_COMMUNITY): Payer: Self-pay

## 2019-04-25 DIAGNOSIS — J45909 Unspecified asthma, uncomplicated: Secondary | ICD-10-CM | POA: Diagnosis not present

## 2019-04-25 DIAGNOSIS — Z91040 Latex allergy status: Secondary | ICD-10-CM | POA: Diagnosis not present

## 2019-04-25 DIAGNOSIS — R110 Nausea: Secondary | ICD-10-CM | POA: Diagnosis not present

## 2019-04-25 DIAGNOSIS — R0602 Shortness of breath: Secondary | ICD-10-CM | POA: Diagnosis not present

## 2019-04-25 DIAGNOSIS — F10920 Alcohol use, unspecified with intoxication, uncomplicated: Secondary | ICD-10-CM

## 2019-04-25 DIAGNOSIS — F10929 Alcohol use, unspecified with intoxication, unspecified: Secondary | ICD-10-CM | POA: Diagnosis not present

## 2019-04-25 NOTE — ED Provider Notes (Signed)
[facility] DEPT Provider Note   CSN: [id number] Arrival date & time: 04/25/19  0207     History   Chief Complaint Chief Complaint  Patient presents with  . Alcohol Intoxication    HPI Angela Jimenez is a 48 y.o. female.     The history is provided by the patient.  Alcohol Intoxication This is a new problem. The current episode started 3 to 5 hours ago. The problem occurs constantly. The problem has not changed since onset.Pertinent negatives include no chest pain, no abdominal pain, no headaches and no shortness of breath. Associated symptoms comments: Wheezing in an asthmatic . Nothing aggravates the symptoms. Nothing relieves the symptoms. She has tried nothing for the symptoms. The treatment provided no relief.  Patient is here with [employer] after being pulled over for concern for intoxication.  She then complained of wheezing and was brought to the ED.  She denies f/c/r. No cough.  No CP.    Past Medical History:  Diagnosis Date  . Asthma   . Bulging lumbar disc    between c3 and c4   . Iron malabsorption 09/08/2017  . Menometrorrhagia 09/08/2017  . Trigger finger 2020    Patient Active Problem List   Diagnosis Date Noted  . Food intolerance 01/24/2019  . Seasonal allergic conjunctivitis 11/27/2018  . Cough 07/06/2018  . Acute non-recurrent maxillary sinusitis 06/13/2018  . Other allergic rhinitis 06/04/2018  . History of uterine leiomyoma 06/04/2018  . Croup 05/28/2018  . Moderate persistent asthma with acute exacerbation 05/28/2018  . Bee sting-induced anaphylaxis 05/28/2018  . History of hypothyroidism 02/15/2018  . Multiple thyroid nodules 02/15/2018  . ERRONEOUS ENCOUNTER--DISREGARD 02/13/2018  . Uterine leiomyoma 01/01/2018  . Seasonal and perennial allergic rhinitis 09/18/2017  . Menometrorrhagia 09/08/2017  . Iron malabsorption 09/08/2017  . Iron deficiency anemia due to chronic blood loss 09/07/2017  . Chronic bilateral  thoracic back pain 07/18/2017  . Neck pain 07/18/2017  . Symptomatic mammary hypertrophy 07/18/2017  . Acute bronchitis due to other specified organisms 06/27/2016  . Mild persistent asthma 10/13/2015  . Moderate persistent asthma without complication 123456  . Allergic rhinitis due to pollen 04/09/2015  . Gastroesophageal reflux disease without esophagitis 04/09/2015  . Anaphylactic shock due to adverse food reaction 04/09/2015    Past Surgical History:  Procedure Laterality Date  . MYOMECTOMY ABDOMINAL APPROACH       OB History   No obstetric history on file.      Home Medications    Prior to Admission medications   Medication Sig Start Date End Date Taking? Authorizing Provider  albuterol (PROVENTIL) (2.5 MG/3ML) 0.083% nebulizer solution Take 3 mLs (2.5 mg total) by nebulization every 4 (four) hours as needed for wheezing or shortness of breath. 04/23/19   Dara Hoyer, FNP  albuterol (VENTOLIN HFA) 108 (90 Base) MCG/ACT inhaler INHALE 2 PUFFS INTO THE LUNGS EVERY 4 HOURS AS NEEDED FOR WHEEZING OR SHORTNESS OF BREATH 01/24/19   Ambs, Kathrine Cords, FNP  budesonide-formoterol (SYMBICORT) 160-4.5 MCG/ACT inhaler Two puffs every 12 hours to prevent cough or wheeze. Rinse, gargle and spit after use. 01/24/19   Dara Hoyer, FNP  Cholecalciferol (VITAMIN D3 PO) Take 2,000 Units by mouth.     [provider]  citalopram (CELEXA) 20 MG tablet TK 1 T PO D 02/14/19   [provider]  cyclobenzaprine (FLEXERIL) 10 MG tablet Take 10 mg by mouth as needed for muscle spasms.    [provider]  EPINEPHrine 0.3 mg/0.3 mL IJ SOAJ injection INJECT IN THE MUSCLE AS DIRECTED FOR SEVERE ALLERGIC REACTIONS 08/21/18   Ambs, Kathrine Cords, FNP  fluticasone (FLONASE) 50 MCG/ACT nasal spray SHAKE LIQUID AND USE 1 SPRAY IN EACH NOSTRIL DAILY Patient taking differently: Place 1 spray into both nostrils daily as needed.  08/21/18   Ambs, Kathrine Cords, FNP  levocetirizine (XYZAL) 5 MG tablet TAKE 1  TABLET BY MOUTH EVERY EVENING 07/09/18   Ambs, Kathrine Cords, FNP  montelukast (SINGULAIR) 10 MG tablet Take 1 tablet (10 mg total) by mouth at bedtime. 01/24/19   Dara Hoyer, FNP  NON FORMULARY Take 500 mg by mouth daily. Fibrenza    [provider]  NON FORMULARY Take 1,000 mg by mouth daily. Pumpkin seed    [provider]  NON FORMULARY Take by mouth daily. Gluten Defense    [provider]  NON FORMULARY Take 400 mg by mouth daily. Red Rasberry    [provider]  Olopatadine HCl (PAZEO) 0.7 % SOLN Place 1 drop into both eyes daily as needed (for red itchy eyes). 11/27/18   Dara Hoyer, FNP  promethazine (PHENERGAN) 12.5 MG tablet Take 12.5 mg by mouth every 4 (four) hours as needed.  07/04/18 07/11/18  [provider]  pseudoephedrine (SUDAFED) 30 MG tablet Take 30 mg by mouth every 4 (four) hours as needed for congestion.    [provider]    Family History Family History  Problem Relation Age of Onset  . Allergic rhinitis Neg Hx   . Angioedema Neg Hx   . Asthma Neg Hx   . Eczema Neg Hx   . Immunodeficiency Neg Hx   . Urticaria Neg Hx     Social History Social History   Tobacco Use  . Smoking status: Never Smoker  . Smokeless tobacco: Never Used  Substance Use Topics  . Alcohol use: No    Alcohol/week: 0.0 standard drinks  . Drug use: No     Allergies   Amoxicillin, Ciprofloxacin, Molds & smuts, Other, Peanut-containing drug products, Penicillins, Wheat bran, Corn-containing products, Latex, Prednisone, and Sulfa antibiotics   Review of Systems Review of Systems  Constitutional: Negative for fever.  HENT: Negative for congestion.   Eyes: Negative for visual disturbance.  Respiratory: Positive for wheezing. Negative for cough and shortness of breath.   Cardiovascular: Negative for chest pain, palpitations and leg swelling.  Gastrointestinal: Positive for nausea. Negative for abdominal pain and vomiting.  Genitourinary:  Negative for difficulty urinating.  Musculoskeletal: Negative for arthralgias.  Skin: Negative for color change.  Neurological: Negative for headaches.  Psychiatric/Behavioral: Negative for agitation.  All other systems reviewed and are negative.    Physical Exam Updated Vital Signs BP 123/76 (BP Location: Left Arm)   Pulse 65   Temp (!) 97.4 F (36.3 C) (Oral)   Resp 14   Ht 5\' 3"  (1.6 m)   Wt 60.8 kg   SpO2 99%   BMI 23.74 kg/m   Physical Exam Vitals signs and nursing note reviewed.  Constitutional:      General: She is not in acute distress.    Appearance: She is normal weight.  HENT:     Head: Normocephalic and atraumatic.     Nose: Nose normal.  Eyes:     Conjunctiva/sclera: Conjunctivae normal.     Pupils: Pupils are equal, round, and reactive to light.  Neck:     Musculoskeletal: Normal range of motion and neck supple.  Cardiovascular:  Rate and Rhythm: Normal rate and regular rhythm.     Pulses: Normal pulses.     Heart sounds: Normal heart sounds.  Pulmonary:     Effort: Pulmonary effort is normal.     Breath sounds: Normal breath sounds.     Comments: There is no wheezing on exam Abdominal:     General: Abdomen is flat. Bowel sounds are normal.     Tenderness: There is no abdominal tenderness.  Musculoskeletal: Normal range of motion.  Skin:    General: Skin is warm and dry.     Capillary Refill: Capillary refill takes less than 2 seconds.  Neurological:     General: No focal deficit present.     Mental Status: She is alert and oriented to person, place, and time.  Psychiatric:        Mood and Affect: Mood normal.        Behavior: Behavior normal.      ED Treatments / Results  Labs (all labs ordered are listed, but only abnormal results are displayed) Labs Reviewed - No data to display  EKG  EKG Interpretation  Date/Time:  Thursday April 25 2019 03:46:36 EST Ventricular Rate:  71 PR Interval:    QRS Duration: 87 QT Interval:  411  QTC Calculation: 447 R Axis:   107 Text Interpretation: Sinus rhythm motion artifact Confirmed by Arnell Slivinski ([id number]) on 04/25/2019 3:53:04 AM       Radiology No results found.  Procedures Procedures (including critical care time)  Medications Ordered in ED Medications - No data to display   Initial Impression / Assessment and Plan / ED Course  Patient is here for intoxication with [employer] for blood draw.  She is not wheezing.  She denies chest pain.  She is clinically sober at this time.  She does not require a breathing treatment.  She has been observed in the ED without incident.  She is stable for discharge with close follow up.    Angela Jimenez was evaluated in Emergency Department on 04/25/2019 for the symptoms described in the history of present illness. She was evaluated in the context of the global COVID-19 pandemic, which necessitated consideration that the patient might be at risk for infection with the SARS-CoV-2 virus that causes COVID-19. Institutional protocols and algorithms that pertain to the evaluation of patients at risk for COVID-19 are in a state of rapid change based on information released by regulatory bodies including the CDC and federal and state organizations. These policies and algorithms were followed during the patient's care in the ED.    Final Clinical Impressions(s) / ED Diagnoses   Return for intractable cough, coughing up blood,fevers >100.4 unrelieved by medication, shortness of breath, intractable vomiting, chest pain, shortness of breath, weakness,numbness, changes in speech, facial asymmetry,abdominal pain, passing out,Inability to tolerate liquids or food, cough, altered mental status or any concerns. No signs of systemic illness or infection. The patient is nontoxic-appearing on exam and vital signs are within normal limits.   I have reviewed the triage vital signs and the nursing notes. Pertinent labs &imaging results that were  available during my care of the patient were reviewed by me and considered in my medical decision making (see chart for details).  After history, exam, and medical workup I feel the patient has been appropriately medically screened and is safe for discharge home. Pertinent diagnoses were discussed with the patient. Patient was given return precautions       Athalie Newhard, MD 04/25/19  0356  

## 2019-04-25 NOTE — ED Triage Notes (Signed)
Patient arrived in [employer] custody, stating that she has asthma and feeling short of breath. Patients oxygen saturation at 100% and in no distress. Also has complaints of nausea and has been seen by a provider and given Zofran. ETOH on board.

## 2019-05-09 DIAGNOSIS — M47812 Spondylosis without myelopathy or radiculopathy, cervical region: Secondary | ICD-10-CM | POA: Diagnosis not present

## 2019-05-20 DIAGNOSIS — M791 Myalgia, unspecified site: Secondary | ICD-10-CM | POA: Diagnosis not present

## 2019-05-20 DIAGNOSIS — M25531 Pain in right wrist: Secondary | ICD-10-CM | POA: Diagnosis not present

## 2019-05-20 DIAGNOSIS — M545 Low back pain: Secondary | ICD-10-CM | POA: Diagnosis not present

## 2019-05-20 DIAGNOSIS — M79644 Pain in right finger(s): Secondary | ICD-10-CM | POA: Diagnosis not present

## 2019-05-20 DIAGNOSIS — M542 Cervicalgia: Secondary | ICD-10-CM | POA: Diagnosis not present

## 2019-06-01 ENCOUNTER — Other Ambulatory Visit: Payer: Self-pay | Admitting: Pediatrics

## 2019-06-10 ENCOUNTER — Other Ambulatory Visit: Payer: Self-pay | Admitting: Pediatrics

## 2019-06-11 DIAGNOSIS — M792 Neuralgia and neuritis, unspecified: Secondary | ICD-10-CM | POA: Diagnosis not present

## 2019-06-11 DIAGNOSIS — Z79899 Other long term (current) drug therapy: Secondary | ICD-10-CM | POA: Diagnosis not present

## 2019-06-11 DIAGNOSIS — M542 Cervicalgia: Secondary | ICD-10-CM | POA: Diagnosis not present

## 2019-06-11 DIAGNOSIS — G8929 Other chronic pain: Secondary | ICD-10-CM | POA: Diagnosis not present

## 2019-06-11 DIAGNOSIS — M545 Low back pain: Secondary | ICD-10-CM | POA: Diagnosis not present

## 2019-06-13 ENCOUNTER — Inpatient Hospital Stay: Payer: PPO

## 2019-06-13 ENCOUNTER — Inpatient Hospital Stay: Payer: PPO | Admitting: Family

## 2019-06-19 ENCOUNTER — Other Ambulatory Visit: Payer: Self-pay

## 2019-06-19 ENCOUNTER — Inpatient Hospital Stay (HOSPITAL_BASED_OUTPATIENT_CLINIC_OR_DEPARTMENT_OTHER): Payer: PPO | Admitting: Family

## 2019-06-19 ENCOUNTER — Inpatient Hospital Stay: Payer: PPO | Attending: Family

## 2019-06-19 ENCOUNTER — Encounter: Payer: Self-pay | Admitting: Family

## 2019-06-19 VITALS — BP 122/83 | HR 81 | Temp 97.5°F | Resp 18 | Ht 62.0 in | Wt 136.1 lb

## 2019-06-19 DIAGNOSIS — N920 Excessive and frequent menstruation with regular cycle: Secondary | ICD-10-CM | POA: Insufficient documentation

## 2019-06-19 DIAGNOSIS — K909 Intestinal malabsorption, unspecified: Secondary | ICD-10-CM | POA: Diagnosis not present

## 2019-06-19 DIAGNOSIS — D508 Other iron deficiency anemias: Secondary | ICD-10-CM

## 2019-06-19 DIAGNOSIS — M549 Dorsalgia, unspecified: Secondary | ICD-10-CM | POA: Diagnosis not present

## 2019-06-19 DIAGNOSIS — M542 Cervicalgia: Secondary | ICD-10-CM | POA: Insufficient documentation

## 2019-06-19 DIAGNOSIS — D500 Iron deficiency anemia secondary to blood loss (chronic): Secondary | ICD-10-CM | POA: Diagnosis not present

## 2019-06-19 DIAGNOSIS — G8929 Other chronic pain: Secondary | ICD-10-CM | POA: Diagnosis not present

## 2019-06-19 LAB — CMP (CANCER CENTER ONLY)
ALT: 12 U/L (ref 0–44)
AST: 13 U/L — ABNORMAL LOW (ref 15–41)
Albumin: 4.4 g/dL (ref 3.5–5.0)
Alkaline Phosphatase: 50 U/L (ref 38–126)
Anion gap: 7 (ref 5–15)
BUN: 11 mg/dL (ref 6–20)
CO2: 31 mmol/L (ref 22–32)
Calcium: 9.3 mg/dL (ref 8.9–10.3)
Chloride: 103 mmol/L (ref 98–111)
Creatinine: 0.98 mg/dL (ref 0.44–1.00)
GFR, Est AFR Am: >60 mL/min (ref >60)
GFR, Estimated: >60 mL/min (ref >60)
Glucose, Bld: 72 mg/dL (ref 70–99)
Potassium: 3.7 mmol/L (ref 3.5–5.1)
Sodium: 141 mmol/L (ref 135–145)
Total Bilirubin: 0.4 mg/dL (ref 0.3–1.2)
Total Protein: 6.6 g/dL (ref 6.5–8.1)

## 2019-06-19 LAB — CBC WITH DIFFERENTIAL (CANCER CENTER ONLY)
Abs Immature Granulocytes: 0.01 10*3/uL (ref 0.00–0.07)
Basophils Absolute: 0.1 10*3/uL (ref 0.0–0.1)
Basophils Relative: 1 %
Eosinophils Absolute: 0.1 10*3/uL (ref 0.0–0.5)
Eosinophils Relative: 2 %
HCT: 40.3 % (ref 36.0–46.0)
Hemoglobin: 13.4 g/dL (ref 12.0–15.0)
Immature Granulocytes: 0 %
Lymphocytes Relative: 45 %
Lymphs Abs: 1.9 10*3/uL (ref 0.7–4.0)
MCH: 30.5 pg (ref 26.0–34.0)
MCHC: 33.3 g/dL (ref 30.0–36.0)
MCV: 91.8 fL (ref 80.0–100.0)
Monocytes Absolute: 0.2 10*3/uL (ref 0.1–1.0)
Monocytes Relative: 5 %
Neutro Abs: 2 10*3/uL (ref 1.7–7.7)
Neutrophils Relative %: 47 %
Platelet Count: 233 10*3/uL (ref 150–400)
RBC: 4.39 MIL/uL (ref 3.87–5.11)
RDW: 12.5 % (ref 11.5–15.5)
WBC Count: 4.2 10*3/uL (ref 4.0–10.5)
nRBC: 0 % (ref 0.0–0.2)

## 2019-06-19 NOTE — Progress Notes (Signed)
Hematology and Oncology Follow Up Visit  Angela Jimenez [id number] [birth year] 49 y.o. 06/19/2019   Principle Diagnosis:  Iron deficiency anemia secondary to malabsorptionand heavy cycles  Current Therapy:   Iv iron as indicated    Interim History:  Angela Jimenez is here today for follow-up. She is feeling fatigued at times.  She has not had a cycle since May 2020.  No blood loss noted. No bruising or petechiae.  Hgb is stable at 13.4, MCV 91 and platelet count 233.  She has had GI issues with abdominal discomfort and constipation off and on. She is drinking smooth move tea as needed. I suggested she start taking a probiotic daily such as Align and follow-up with her gastroenterologist.  She has occasional chills. No fever, n/v, cough, rash, dizziness, chest pain, palpitations or changes in bladder habits.  She has chronic neck and back pain and has been seeing pain management for this since fall of 2020.  No falls or syncopal episodes. No swelling, numbness or tingling in her extremities at this time.  She feels that her appetite is a little down but she is doing her best to stay well hydrated. Her weight is stable.   ECOG Performance Status: 1 - Symptomatic but completely ambulatory  Medications:  Allergies as of 06/19/2019      Reactions   Amoxicillin Anaphylaxis   Ciprofloxacin Anaphylaxis      Molds & Smuts Hives, Shortness Of Breath   Other Anaphylaxis, Hives, Other (See Comments)   Tree nuts, anaphylaxis and migraine headache   Peanut-containing Drug Products Anaphylaxis   Penicillins Anaphylaxis   Wheat Bran    Corn-containing Products Other (See Comments)   Bloated and gassy   Latex Hives   Prednisone Other (See Comments)   "Makes me numb and tingly" increases eye pressure.    Sulfa Antibiotics Other (See Comments)   Other reaction(s): UNSPECIFIED      Medication List       Accurate as of June 19, 2019 10:52 AM. If you have any questions, ask your nurse or  doctor.        albuterol 108 (90 Base) MCG/ACT inhaler Commonly known as: VENTOLIN HFA INHALE 2 PUFFS INTO THE LUNGS EVERY 4 HOURS AS NEEDED FOR WHEEZING OR SHORTNESS OF BREATH   albuterol (2.5 MG/3ML) 0.083% nebulizer solution Commonly known as: PROVENTIL Take 3 mLs (2.5 mg total) by nebulization every 4 (four) hours as needed for wheezing or shortness of breath.   budesonide-formoterol 160-4.5 MCG/ACT inhaler Commonly known as: Symbicort Two puffs every 12 hours to prevent cough or wheeze. Rinse, gargle and spit after use.   citalopram 20 MG tablet Commonly known as: CELEXA TK 1 T PO D   cyclobenzaprine 10 MG tablet Commonly known as: FLEXERIL Take 10 mg by mouth as needed for muscle spasms.   EPINEPHrine 0.3 mg/0.3 mL Soaj injection Commonly known as: EPI-PEN INJECT IN THE MUSCLE AS DIRECTED FOR SEVERE ALLERGIC REACTIONS   fluticasone 50 MCG/ACT nasal spray Commonly known as: FLONASE SHAKE LIQUID AND USE 1 SPRAY IN EACH NOSTRIL DAILY What changed: See the new instructions.   levocetirizine 5 MG tablet Commonly known as: XYZAL TAKE 1 TABLET BY MOUTH EVERY EVENING   montelukast 10 MG tablet Commonly known as: Singulair Take 1 tablet (10 mg total) by mouth at bedtime.   NON FORMULARY Take 500 mg by mouth daily. Fibrenza   NON FORMULARY Take 1,000 mg by mouth daily. Pumpkin seed   NON FORMULARY Take by  mouth daily. Gluten Defense   NON FORMULARY Take 400 mg by mouth daily. Red Rasberry   Pazeo 0.7 % Soln Generic drug: Olopatadine HCl Place 1 drop into both eyes daily as needed (for red itchy eyes).   promethazine 12.5 MG tablet Commonly known as: PHENERGAN Take 12.5 mg by mouth every 4 (four) hours as needed.   Sudafed 30 MG tablet Generic drug: pseudoephedrine Take 30 mg by mouth every 4 (four) hours as needed for congestion.   VITAMIN D3 PO Take 2,000 Units by mouth.       Allergies:  Allergies  Allergen Reactions  . Amoxicillin Anaphylaxis    . Ciprofloxacin Anaphylaxis       . Molds & Smuts Hives and Shortness Of Breath  . Other Anaphylaxis, Hives and Other (See Comments)    Tree nuts, anaphylaxis and migraine headache  . Peanut-Containing Drug Products Anaphylaxis  . Penicillins Anaphylaxis  . Wheat Bran   . Corn-Containing Products Other (See Comments)    Bloated and gassy  . Latex Hives  . Prednisone Other (See Comments)    "Makes me numb and tingly" increases eye pressure.   . Sulfa Antibiotics Other (See Comments)    Other reaction(s): UNSPECIFIED    Past Medical History, Surgical history, Social history, and Family History were reviewed and updated.  Review of Systems: All other 10 point review of systems is negative.   Physical Exam:  vitals were not taken for this visit.   Wt Readings from Last 3 Encounters:  04/25/19 134 lb (60.8 kg)  03/04/19 136 lb 3.2 oz (61.8 kg)  02/15/19 134 lb (60.8 kg)    Ocular: Sclerae unicteric, pupils equal, round and reactive to light Ear-nose-throat: Oropharynx clear, dentition fair Lymphatic: No cervical or supraclavicular adenopathy Lungs no rales or rhonchi, good excursion bilaterally Heart regular rate and rhythm, no murmur appreciated Abd soft, nontender, positive bowel sounds, no liver or spleen tip palpated on exam, no fluid wave  MSK no focal spinal tenderness, no joint edema Neuro: non-focal, well-oriented, appropriate affect Breasts: Deferred   Lab Results  Component Value Date   WBC 3.8 (L) 02/15/2019   HGB 13.4 02/15/2019   HCT 40.8 02/15/2019   MCV 93.2 02/15/2019   PLT 221 02/15/2019   Lab Results  Component Value Date   FERRITIN 79 02/15/2019   IRON 80 02/15/2019   TIBC 305 02/15/2019   UIBC 225 02/15/2019   IRONPCTSAT 26 02/15/2019   Lab Results  Component Value Date   RETICCTPCT 0.8 11/15/2018   RBC 4.38 02/15/2019   No results found for: KPAFRELGTCHN, LAMBDASER, KAPLAMBRATIO No results found for: IGGSERUM, IGA, IGMSERUM No results  found for: Odetta Pink, SPEI   Chemistry      Component Value Date/Time   NA 141 02/15/2019 1018   K 3.6 02/15/2019 1018   CL 103 02/15/2019 1018   CO2 30 02/15/2019 1018   BUN 16 02/15/2019 1018   CREATININE 0.94 02/15/2019 1018      Component Value Date/Time   CALCIUM 9.4 02/15/2019 1018   ALKPHOS 47 02/15/2019 1018   AST 13 (L) 02/15/2019 1018   ALT 10 02/15/2019 1018   BILITOT 0.4 02/15/2019 1018       Impression and Plan: Angela Jimenez is a very pleasant 49 yo African American female with iron deficiency anemia secondary to heavy cycles and malabsorption.  We will see what her iron studies look like and replace if needed.  We  will plan to see her back in another 4 months.  She will contact our office with any questions or concerns. We can certainly see her sooner if needed.   Laverna Peace, NP 1/27/202110:52 AM

## 2019-06-20 LAB — IRON AND TIBC
Iron: 137 ug/dL (ref 41–142)
Saturation Ratios: 43 % (ref 21–57)
TIBC: 323 ug/dL (ref 236–444)
UIBC: 185 ug/dL (ref 120–384)

## 2019-06-20 LAB — FERRITIN: Ferritin: 40 ng/mL (ref 11–307)

## 2019-07-10 DIAGNOSIS — Z01411 Encounter for gynecological examination (general) (routine) with abnormal findings: Secondary | ICD-10-CM | POA: Diagnosis not present

## 2019-07-10 DIAGNOSIS — N912 Amenorrhea, unspecified: Secondary | ICD-10-CM | POA: Diagnosis not present

## 2019-07-10 DIAGNOSIS — Z01419 Encounter for gynecological examination (general) (routine) without abnormal findings: Secondary | ICD-10-CM | POA: Diagnosis not present

## 2019-07-10 DIAGNOSIS — Z1151 Encounter for screening for human papillomavirus (HPV): Secondary | ICD-10-CM | POA: Diagnosis not present

## 2019-07-10 DIAGNOSIS — Z3202 Encounter for pregnancy test, result negative: Secondary | ICD-10-CM | POA: Diagnosis not present

## 2019-07-15 DIAGNOSIS — Z1231 Encounter for screening mammogram for malignant neoplasm of breast: Secondary | ICD-10-CM | POA: Diagnosis not present

## 2019-07-19 DIAGNOSIS — M792 Neuralgia and neuritis, unspecified: Secondary | ICD-10-CM | POA: Diagnosis not present

## 2019-07-19 DIAGNOSIS — Z79899 Other long term (current) drug therapy: Secondary | ICD-10-CM | POA: Diagnosis not present

## 2019-07-19 DIAGNOSIS — M542 Cervicalgia: Secondary | ICD-10-CM | POA: Diagnosis not present

## 2019-07-24 DIAGNOSIS — N62 Hypertrophy of breast: Secondary | ICD-10-CM | POA: Diagnosis not present

## 2019-07-25 DIAGNOSIS — R922 Inconclusive mammogram: Secondary | ICD-10-CM | POA: Diagnosis not present

## 2019-07-25 DIAGNOSIS — R928 Other abnormal and inconclusive findings on diagnostic imaging of breast: Secondary | ICD-10-CM | POA: Diagnosis not present

## 2019-08-01 ENCOUNTER — Ambulatory Visit (INDEPENDENT_AMBULATORY_CARE_PROVIDER_SITE_OTHER): Payer: PPO | Admitting: Family Medicine

## 2019-08-01 ENCOUNTER — Encounter: Payer: Self-pay | Admitting: Family Medicine

## 2019-08-01 ENCOUNTER — Other Ambulatory Visit: Payer: Self-pay

## 2019-08-01 ENCOUNTER — Telehealth: Payer: Self-pay | Admitting: Family Medicine

## 2019-08-01 VITALS — BP 100/76 | HR 84 | Temp 98.4°F | Resp 16

## 2019-08-01 DIAGNOSIS — K219 Gastro-esophageal reflux disease without esophagitis: Secondary | ICD-10-CM

## 2019-08-01 DIAGNOSIS — H1010 Acute atopic conjunctivitis, unspecified eye: Secondary | ICD-10-CM | POA: Diagnosis not present

## 2019-08-01 DIAGNOSIS — J3089 Other allergic rhinitis: Secondary | ICD-10-CM

## 2019-08-01 DIAGNOSIS — K9049 Malabsorption due to intolerance, not elsewhere classified: Secondary | ICD-10-CM | POA: Diagnosis not present

## 2019-08-01 DIAGNOSIS — T7800XD Anaphylactic reaction due to unspecified food, subsequent encounter: Secondary | ICD-10-CM | POA: Diagnosis not present

## 2019-08-01 DIAGNOSIS — J4541 Moderate persistent asthma with (acute) exacerbation: Secondary | ICD-10-CM | POA: Diagnosis not present

## 2019-08-01 DIAGNOSIS — J302 Other seasonal allergic rhinitis: Secondary | ICD-10-CM

## 2019-08-01 MED ORDER — MONTELUKAST SODIUM 10 MG PO TABS: 10.0000 mg | ORAL_TABLET | Freq: Every day | ORAL | 5 refills | Status: DC

## 2019-08-01 MED ORDER — LEVOCETIRIZINE DIHYDROCHLORIDE 5 MG PO TABS: 5.0000 mg | ORAL_TABLET | Freq: Every evening | ORAL | 5 refills | Status: DC

## 2019-08-01 MED ORDER — ALBUTEROL SULFATE (2.5 MG/3ML) 0.083% IN NEBU: 2.5000 mg | INHALATION_SOLUTION | Freq: Four times a day (QID) | RESPIRATORY_TRACT | 1 refills | Status: DC | PRN

## 2019-08-01 MED ORDER — TRIAMCINOLONE ACETONIDE 55 MCG/ACT NA AERO: 2.0000 | INHALATION_SPRAY | Freq: Every day | NASAL | 2 refills | Status: DC | PRN

## 2019-08-01 MED ORDER — BUDESONIDE-FORMOTEROL FUMARATE 160-4.5 MCG/ACT IN AERO: 2.0000 | INHALATION_SPRAY | Freq: Two times a day (BID) | RESPIRATORY_TRACT | 5 refills | Status: DC

## 2019-08-01 NOTE — Telephone Encounter (Signed)
Tried to call patient. No answer, left message for a return call to office regarding prednisone for asthma.

## 2019-08-01 NOTE — Telephone Encounter (Signed)
Spoke with patient, she is having fatigue from her allergies and asthma, some wheezing but not today. She is asking for some prednisone.

## 2019-08-01 NOTE — Patient Instructions (Addendum)
Moderate persistent asthma Increase Symbicort 160-to 2 puffs twice a day with a spacer to prevent cough and wheeze Restart montelukast 10 mg once a day to prevent cough and wheeze. Patient cautioned that rarely some children/adults can experience behavioral changes after beginning montelukast. These side effects are rare, however, if you notice any change, notify the clinic and discontinue montelukast. Continue albuterol as needed for cough or wheeze  Seasonal and perennial allergic rhinitis Begin Flonase 1 spray in each nostril once a day.  In the right nostril, point the applicator out toward the right ear. In the left nostril, point the applicator out toward the left ear Consider saline nasal rinses as needed for nasal symptoms. Use this before any medicated nasal sprays for best result Begin Xyzal 5 mg once a day as needed for a runny nose or itch. This will replace Claritin Continue allergen avoidance measures  Allergic conjunctivitis Continue Pazeo one drop once a day as needed for red, itchy eyes  Gastroesophageal reflux disease without esophagitis Continue lifestyle modifications as listed below  Food intolerance Recommend referral to gastrointestinal specialist for evaluation of gas and bloating  Anaphylactic shock due to food, subsequent encounter Continue to avoid almond milk, peanuts, tree nuts, wheat, and corn products. If you are having an allergic reaction take Benadryl 50 mg and if you have life threatening symptoms inject with EpiPen 0.3 mg and call 911  Follow up in 3 mo or sooner if needed   Lifestyle Changes for Controlling GERD When you have GERD, stomach acid feels as if it's backing up toward your mouth. Whether or not you take medication to control your GERD, your symptoms can often be improved with lifestyle changes.   Raise Your Head  Reflux is more likely to strike when you're lying down flat, because stomach fluid can  flow backward more easily. Raising  the head of your bed 4-6 inches can help. To do this:  Slide blocks or books under the legs at the head of your bed. Or, place a wedge under  the mattress. Many foam stores can make a suitable wedge for you. The wedge  should run from your waist to the top of your head.  Don't just prop your head on several pillows. This increases pressure on your  stomach. It can make GERD worse.  Watch Your Eating Habits Certain foods may increase the acid in your stomach or relax the lower esophageal sphincter, making GERD more likely. It's best to avoid the following:  Coffee, tea, and carbonated drinks (with and without caffeine)  Fatty, fried, or spicy food  Mint, chocolate, onions, and tomatoes  Any other foods that seem to irritate your stomach or cause you pain  Relieve the Pressure  Eat smaller meals, even if you have to eat more often.  Don't lie down right after you eat. Wait a few hours for your stomach to empty.  Avoid tight belts and tight-fitting clothes.  Lose excess weight.  Tobacco and Alcohol  Avoid smoking tobacco and drinking alcohol. They can make GERD symptoms worse.

## 2019-08-01 NOTE — Progress Notes (Addendum)
[place] [id number] Dept: [phone]  FOLLOW UP NOTE  Patient ID: Angela Jimenez, female    DOB: [birth year]  Age: 49 y.o. MRN: [id number] Date of Office Visit: 08/01/2019  Assessment  Chief Complaint: Asthma  HPI Angela Jimenez is a 49 year old female who presents to the clinic for a follow up visit. She was last seen in this clinic on 01/24/2019 for evaluation of asthma, allergic rhinitis, allergic conjunctivitis, reflux, and food allergies.  At today's visit, she reports that on Monday she was helping a friend with an old car that had a mold smell and she was also exposed to gasoline fumes that night.  She reports that the next day she began to experience some swelling of her glands and heart fluttering for which she took Benadryl with relief of symptoms.  She reports that her asthma has been moderately well controlled with some shortness of breath and wheeze that began on Monday and Tuesday.  She denies cough.  She continues Symbicort 2 puffs once a day with a spacer and is using albuterol 2 puffs once a day on a regular schedule.  She is not currently taking montelukast.  Allergic rhinitis is reported as moderately well controlled with nasal congestion as the main symptom.  She continues Claritin 10 mg once a day and uses Flonase as needed and occasional saline nasal rinses.  Reflux is reported as moderately well controlled with occasional heartburn and continuous digestive issues.  She does see a gastroenterology specialist.  She continues to avoid peanut, tree nut, wheat, and corn and have access to an epinephrine auto-injector set. Her current medications are listed in the chart.    Drug Allergies:  Allergies  Allergen Reactions  . Amoxicillin Anaphylaxis  . Ciprofloxacin Anaphylaxis       . Molds & Smuts Hives and Shortness Of Breath  . Other Anaphylaxis, Hives and Other (See Comments)    Tree nuts, anaphylaxis and migraine headache  . Peanut-Containing Drug  Products Anaphylaxis  . Penicillins Anaphylaxis  . Propoxyphene Nausea And Vomiting  . Sulfa Antibiotics Other (See Comments)    ASTHMA ATTACK  . Wheat Bran   . Wool Alcohol [Lanolin] Anaphylaxis    Migraines, asthma, GI upset  . Corn-Containing Products Other (See Comments)    Bloated and gassy  . Latex Hives  . Prednisone Other (See Comments)    "Makes me numb and tingly" increases eye pressure.     Physical Exam: BP 100/76   Pulse 84   Temp 98.4 F (36.9 C) (Oral)   Resp 16   SpO2 98%    Physical Exam Vitals reviewed.  Constitutional:      Appearance: Normal appearance.  HENT:     Head: Normocephalic and atraumatic.     Right Ear: Tympanic membrane normal.     Left Ear: Tympanic membrane normal.     Nose:     Comments: Bilateral nares edematous and pale with clear nasal drainage noted.  Pharynx slightly erythematous with no exudate.  Ears normal.  Eyes normal. Eyes:     Conjunctiva/sclera: Conjunctivae normal.  Cardiovascular:     Rate and Rhythm: Normal rate and regular rhythm.     Heart sounds: Normal heart sounds. No murmur.  Pulmonary:     Effort: Pulmonary effort is normal.     Breath sounds: Normal breath sounds.     Comments: Lungs clear to auscultation Musculoskeletal:        General: Normal  range of motion.     Cervical back: Normal range of motion and neck supple.  Skin:    General: Skin is warm and dry.  Neurological:     Mental Status: She is alert and oriented to person, place, and time.  Psychiatric:        Mood and Affect: Mood normal.        Behavior: Behavior normal.        Thought Content: Thought content normal.        Judgment: Judgment normal.    Diagnostics: FVC 2.37, FEV1 1.99. Predicted FVC 2.70, predicted FEV1 2.18. Spirometry indicates normal ventilatory function.   Assessment and Plan: 1. Moderate persistent asthma with acute exacerbation   2. Seasonal and perennial allergic rhinitis   3. Seasonal allergic conjunctivitis    4. Gastroesophageal reflux disease without esophagitis   5. Food intolerance   6. Anaphylactic shock due to food, subsequent encounter     Meds ordered this encounter  Medications  . budesonide-formoterol (SYMBICORT) 160-4.5 MCG/ACT inhaler    Sig: Inhale 2 puffs into the lungs 2 (two) times daily.    Dispense:  1 Inhaler    Refill:  5  . levocetirizine (XYZAL) 5 MG tablet    Sig: Take 1 tablet (5 mg total) by mouth every evening.    Dispense:  30 tablet    Refill:  5  . albuterol (PROVENTIL) (2.5 MG/3ML) 0.083% nebulizer solution    Sig: Take 3 mLs (2.5 mg total) by nebulization every 6 (six) hours as needed for wheezing or shortness of breath.    Dispense:  75 mL    Refill:  1  . montelukast (SINGULAIR) 10 MG tablet    Sig: Take 1 tablet (10 mg total) by mouth at bedtime.    Dispense:  30 tablet    Refill:  5  . triamcinolone (NASACORT) 55 MCG/ACT AERO nasal inhaler    Sig: Place 2 sprays into the nose daily as needed.    Dispense:  1 Inhaler    Refill:  2    Patient Instructions  Moderate persistent asthma Increase Symbicort 160-to 2 puffs twice a day with a spacer to prevent cough and wheeze Restart montelukast 10 mg once a day to prevent cough and wheeze. Patient cautioned that rarely some children/adults can experience behavioral changes after beginning montelukast. These side effects are rare, however, if you notice any change, notify the clinic and discontinue montelukast. Continue albuterol as needed for cough or wheeze  Seasonal and perennial allergic rhinitis Begin Flonase 1 spray in each nostril once a day.  In the right nostril, point the applicator out toward the right ear. In the left nostril, point the applicator out toward the left ear Consider saline nasal rinses as needed for nasal symptoms. Use this before any medicated nasal sprays for best result Begin Xyzal 5 mg once a day as needed for a runny nose or itch. This will replace Claritin Continue allergen  avoidance measures  Allergic conjunctivitis Continue Pazeo one drop once a day as needed for red, itchy eyes  Gastroesophageal reflux disease without esophagitis Continue lifestyle modifications as listed below  Food intolerance Recommend referral to gastrointestinal specialist for evaluation of gas and bloating  Anaphylactic shock due to food, subsequent encounter Continue to avoid almond milk, peanuts, tree nuts, wheat, and corn products. If you are having an allergic reaction take Benadryl 50 mg and if you have life threatening symptoms inject with EpiPen 0.3 mg and call 911  Follow up in 3 mo or sooner if needed   Return in about 3 months (around 11/01/2019), or if symptoms worsen or fail to improve.    Thank you for the opportunity to care for this patient.  Please do not hesitate to contact me with questions.  Gareth Morgan, FNP [facility] [place]  ________________________________________________  I have provided oversight concerning [name]'s evaluation and treatment of this patient's health issues addressed during today's encounter.  I agree with the assessment and therapeutic plan as outlined in the note.   Signed,   R Edgar Frisk, MD

## 2019-08-01 NOTE — Telephone Encounter (Signed)
Requesting prednisone for her asthma.  Wants to inquire on a more comfortable type of mask to wear

## 2019-08-02 DIAGNOSIS — M545 Low back pain: Secondary | ICD-10-CM | POA: Diagnosis not present

## 2019-08-02 DIAGNOSIS — M542 Cervicalgia: Secondary | ICD-10-CM | POA: Diagnosis not present

## 2019-08-02 MED ORDER — PREDNISONE 10 MG PO TABS: ORAL_TABLET | ORAL | 0 refills | Status: DC

## 2019-08-02 NOTE — Telephone Encounter (Signed)
Spoke with patient about prednisone listed under her allergies. She states, that she only had a problem when  she took it for a long period of time. It caused pressure build-up behind eyes. If she is taking it for a short period it doesn't bother her.

## 2019-08-02 NOTE — Telephone Encounter (Signed)
Prednisone is listed under her allergy list. Can you please find out what her reaction is and how she has tolerated prednisone before? Thank you

## 2019-08-02 NOTE — Telephone Encounter (Signed)
Can you please order prednisone 10 mg tablets. Take 2 tablets once a day for 4 days, then take 1 tablet on the 5th day, then stop. Please encourage her to follow the changes we made to her treatment plan yesterday also. Thank you much

## 2019-08-02 NOTE — Telephone Encounter (Signed)
Prednisone call out to [facility]. Patient encouraged to follow changes made to her treatment plan.

## 2019-08-03 DIAGNOSIS — M542 Cervicalgia: Secondary | ICD-10-CM | POA: Diagnosis not present

## 2019-08-03 DIAGNOSIS — M545 Low back pain: Secondary | ICD-10-CM | POA: Diagnosis not present

## 2019-08-08 ENCOUNTER — Telehealth: Payer: Self-pay | Admitting: Family Medicine

## 2019-08-08 NOTE — Telephone Encounter (Signed)
Patient is requesting another prednisone refill as she is still having flareups with no relief

## 2019-08-08 NOTE — Telephone Encounter (Signed)
Angela Jimenez please advise:  Patient is requesting another prednisone refill as she is still having flareups with no relief

## 2019-08-09 MED ORDER — PREDNISONE 10 MG PO TABS: ORAL_TABLET | ORAL | 0 refills | Status: DC

## 2019-08-09 NOTE — Telephone Encounter (Signed)
Can you please call in prednisone 10 mg tablets. Take 1 tablet once a day for 3 days. Please have her follow up in the lcinic if not feeling better thank yoiu

## 2019-08-09 NOTE — Telephone Encounter (Signed)
Pt informed that prednisone was sent to pharmacy and she will schedule an apt in clinic if no better.

## 2019-08-09 NOTE — Telephone Encounter (Signed)
PT called today to follow up on previous notes. She states is is following all the med regimens that [name] suggested and also using her albuterol inhaler every 4 hours. States her asthma is out of control and it hurts in her check and back between the shoulder blades. She would like more prednisone.

## 2019-08-09 NOTE — Telephone Encounter (Signed)
Can you please make sure she has maximized her medications? Symbicort 160-to 2 puffs twice a day with a spacer, Restart montelukast 10 mg once a day Continue albuterol as needed for cough or wheeze Flonase 1-2 sprays in each nostril once a day.  In the right nostril, point the applicator out toward the right ear. In the left nostril, point the applicator out toward the left ear Begin saline nasal rinses as needed for nasal symptoms. Use this before any medicated nasal sprays for best result Xyzal 5 mg once a day as needed for a runny nose or itch. She can take a second Xyzal if no relief with the first dose.

## 2019-08-14 ENCOUNTER — Encounter: Payer: Self-pay | Admitting: Family Medicine

## 2019-08-14 ENCOUNTER — Other Ambulatory Visit: Payer: Self-pay

## 2019-08-14 ENCOUNTER — Ambulatory Visit (INDEPENDENT_AMBULATORY_CARE_PROVIDER_SITE_OTHER): Payer: PPO | Admitting: Family Medicine

## 2019-08-14 VITALS — BP 112/68 | HR 88 | Temp 98.3°F | Resp 16 | Ht 61.7 in | Wt 138.4 lb

## 2019-08-14 DIAGNOSIS — T7800XD Anaphylactic reaction due to unspecified food, subsequent encounter: Secondary | ICD-10-CM

## 2019-08-14 DIAGNOSIS — K219 Gastro-esophageal reflux disease without esophagitis: Secondary | ICD-10-CM

## 2019-08-14 DIAGNOSIS — J302 Other seasonal allergic rhinitis: Secondary | ICD-10-CM

## 2019-08-14 DIAGNOSIS — J4541 Moderate persistent asthma with (acute) exacerbation: Secondary | ICD-10-CM

## 2019-08-14 DIAGNOSIS — K9049 Malabsorption due to intolerance, not elsewhere classified: Secondary | ICD-10-CM

## 2019-08-14 DIAGNOSIS — H1010 Acute atopic conjunctivitis, unspecified eye: Secondary | ICD-10-CM | POA: Diagnosis not present

## 2019-08-14 DIAGNOSIS — J3089 Other allergic rhinitis: Secondary | ICD-10-CM | POA: Diagnosis not present

## 2019-08-14 MED ORDER — TRIAMCINOLONE ACETONIDE 55 MCG/ACT NA AERO: INHALATION_SPRAY | NASAL | 5 refills | Status: DC

## 2019-08-14 MED ORDER — SPIRIVA RESPIMAT 1.25 MCG/ACT IN AERS: 2.0000 | INHALATION_SPRAY | RESPIRATORY_TRACT | 5 refills | Status: DC

## 2019-08-14 MED ORDER — MONTELUKAST SODIUM 10 MG PO TABS: 10.0000 mg | ORAL_TABLET | Freq: Every day | ORAL | 5 refills | Status: DC

## 2019-08-14 MED ORDER — EPINEPHRINE 0.3 MG/0.3ML IJ SOAJ: INTRAMUSCULAR | 3 refills | Status: DC

## 2019-08-14 MED ORDER — OLOPATADINE HCL 0.2 % OP SOLN: OPHTHALMIC | 5 refills | Status: DC

## 2019-08-14 MED ORDER — ALBUTEROL SULFATE HFA 108 (90 BASE) MCG/ACT IN AERS: INHALATION_SPRAY | RESPIRATORY_TRACT | 1 refills | Status: DC

## 2019-08-14 MED ORDER — BUDESONIDE-FORMOTEROL FUMARATE 160-4.5 MCG/ACT IN AERO: 2.0000 | INHALATION_SPRAY | Freq: Two times a day (BID) | RESPIRATORY_TRACT | 5 refills | Status: DC

## 2019-08-14 MED ORDER — LEVOCETIRIZINE DIHYDROCHLORIDE 5 MG PO TABS: 5.0000 mg | ORAL_TABLET | Freq: Every evening | ORAL | 5 refills | Status: DC

## 2019-08-14 MED ORDER — AZELASTINE HCL 0.1 % NA SOLN: NASAL | 5 refills | Status: DC

## 2019-08-14 NOTE — Patient Instructions (Addendum)
Moderate persistent asthma Begin Spiriva 1.25 mcg-2 puffs once a day to prevent cough or wheeze Continue Symbicort 160-to 2 puffs twice a day with a spacer to prevent cough and wheeze Continue montelukast 10 mg once a day to prevent cough and wheeze. Patient cautioned that rarely some children/adults can experience behavioral changes after beginning montelukast. These side effects are rare, however, if you notice any change, notify the clinic and discontinue montelukast. Continue albuterol as needed for cough or wheeze  Seasonal and perennial allergic rhinitis Begin Flonase or Nasocort 1 spray in each nostril twice a day.  In the right nostril, point the applicator out toward the right ear. In the left nostril, point the applicator out toward the left ear Begin saline nasal rinses twice a day for nasal symptoms. Use this before any medicated nasal sprays for best result Begin Xyzal 5 mg once a day as needed for a runny nose or itch. This will replace Claritin Continue allergen avoidance measures Begin azelastine nasal spray 2 sprays in each nostril twice a day as needed for a runny nose. This may help with sinus headache  Allergic conjunctivitis Continue Pazeo one drop once a day as needed for red, itchy eyes  Gastroesophageal reflux disease  Continue lifestyle modifications as you have been Follow up with your gastroenterologist as scheduled  Food intolerance Recommend referral to gastrointestinal specialist for evaluation of gas and bloating  Anaphylactic shock due to food, subsequent encounter Continue to avoid almond milk, peanuts, tree nuts, wheat, and corn products. If you are having an allergic reaction take Benadryl 50 mg and if you have life threatening symptoms inject with EpiPen 0.3 mg and call 911  Follow up in 2 mo or sooner if needed

## 2019-08-14 NOTE — Progress Notes (Addendum)
[place] [id number] Dept: [phone]  FOLLOW UP NOTE  Patient ID: Angela Jimenez, female    DOB: Sep 25, 1970  Age: 49 y.o. MRN: [id number] Date of Office Visit: 08/14/2019  Assessment  Chief Complaint: Asthma (chest tightness has not improved since last OV 08/01/2019.  exposure to mold in house as well.), Nasal Congestion (sinus pain and pressure persistent.), Fatigue, and Shortness of Breath (right side of shoulder area)  HPI Angela Jimenez is a 49 year old female who presents to the clinic for follow-up visit today.  She was last seen in this clinic on 08/01/2019 for evaluation of global asthma, allergic rhinitis, allergic conjunctivitis, reflux, and food allergies.  At today's visit, she reports that her asthma has not been well controlled and has not improved much since her last visit.  She reports that her symptoms are worse 1 spending a lot of time outside.  She reports shortness of breath and chest tightness with activity and rest for which she continues montelukast 10 mg once a day, Symbicort 160-2 puffs twice a day with a spacer, and is using albuterol every 4 hours for the last week with moderate relief of symptoms.  Allergic rhinitis is reported as not well controlled with pressure reported around her eyes.  She denies drainage, postnasal drainage, and sneezing.  She continues Nasacort 1 spray in each nostril once a day, nasal saline spray, and Xyzal 5 mg once a day.  She reports fluid in her ears which is not irritating her.  Allergic conjunctivitis is reported as well controlled with Pazeo as needed.  She denies reflux and is not currently taking any medications for this.  She reports that she has occasional heartburn and continuous digestive issues for which she sees a gastroenterologist.  She continues to avoid peanut, tree nut, wheat, and corn and have access to an epinephrine autoinjector set at all times.  Her current medications are listed in the chart.   Drug  Allergies:  Allergies  Allergen Reactions  . Amoxicillin Anaphylaxis  . Ciprofloxacin Anaphylaxis       . Molds & Smuts Hives and Shortness Of Breath  . Other Anaphylaxis, Hives and Other (See Comments)    Tree nuts, anaphylaxis and migraine headache  . Peanut-Containing Drug Products Anaphylaxis  . Penicillins Anaphylaxis  . Propoxyphene Nausea And Vomiting  . Sulfa Antibiotics Other (See Comments)    ASTHMA ATTACK  . Wheat Bran   . Wool Alcohol [Lanolin] Anaphylaxis    Migraines, asthma, GI upset  . Corn-Containing Products Other (See Comments)    Bloated and gassy  . Latex Hives  . Prednisone Other (See Comments)    "Makes me numb and tingly" increases eye pressure.     Physical Exam: BP 112/68 (BP Location: Right Arm, Patient Position: Sitting, Cuff Size: Normal)   Pulse 88   Temp 98.3 F (36.8 C) (Oral)   Resp 16   Ht 5' 1.7" (1.567 m)   Wt 138 lb 6.4 oz (62.8 kg)   SpO2 96%   BMI 25.56 kg/m    Physical Exam Vitals reviewed.  Constitutional:      Appearance: Normal appearance. She is well-developed.  HENT:     Head: Normocephalic and atraumatic.     Right Ear: Tympanic membrane normal.     Left Ear: Tympanic membrane normal.     Ears:     Comments: No fluid noted bilateral ears    Mouth/Throat:     Pharynx: Oropharynx is clear.  Eyes:     Conjunctiva/sclera: Conjunctivae normal.  Cardiovascular:     Rate and Rhythm: Normal rate and regular rhythm.     Heart sounds: Normal heart sounds. No murmur.  Pulmonary:     Effort: Pulmonary effort is normal.     Breath sounds: Normal breath sounds.     Comments: Lungs clear to auscultation Musculoskeletal:        General: Normal range of motion.     Cervical back: Normal range of motion and neck supple.  Skin:    General: Skin is warm and dry.  Neurological:     Mental Status: She is alert and oriented to person, place, and time.  Psychiatric:        Mood and Affect: Mood normal.        Behavior: Behavior  normal.        Thought Content: Thought content normal.        Judgment: Judgment normal.     Diagnostics: FVC 2.38, FEV1 2.01.  Predicted FVC 2.70, predicted FEV1 2.18.  Spirometry indicates normal ventilatory function.  Assessment and Plan: 1. Moderate persistent asthma with acute exacerbation   2. Seasonal and perennial allergic rhinitis   3. Seasonal allergic conjunctivitis   4. Gastroesophageal reflux disease without esophagitis   5. Anaphylactic shock due to food, subsequent encounter   6. Food intolerance     Meds ordered this encounter  Medications  . Tiotropium Bromide Monohydrate (SPIRIVA RESPIMAT) 1.25 MCG/ACT AERS    Sig: Inhale 2 puffs into the lungs daily.    Dispense:  4 g    Refill:  5  . budesonide-formoterol (SYMBICORT) 160-4.5 MCG/ACT inhaler    Sig: Inhale 2 puffs into the lungs 2 (two) times daily.    Dispense:  1 Inhaler    Refill:  5  . EPINEPHrine 0.3 mg/0.3 mL IJ SOAJ injection    Sig: Use as directed for severe allergic reaction.    Dispense:  2 each    Refill:  3    Dispense one box of 2 devices each.  Dispense TEVA or MYLAN brand epinephrine.  . montelukast (SINGULAIR) 10 MG tablet    Sig: Take 1 tablet (10 mg total) by mouth at bedtime.    Dispense:  30 tablet    Refill:  5  . albuterol (VENTOLIN HFA) 108 (90 Base) MCG/ACT inhaler    Sig: INHALE 2 PUFFS INTO THE LUNGS EVERY 4 HOURS AS NEEDED FOR WHEEZING OR SHORTNESS OF BREATH    Dispense:  18 g    Refill:  1  . triamcinolone (NASACORT) 55 MCG/ACT AERO nasal inhaler    Sig: One spray each nostril twice a day as needed    Dispense:  16.5 g    Refill:  5  . levocetirizine (XYZAL) 5 MG tablet    Sig: Take 1 tablet (5 mg total) by mouth every evening.    Dispense:  30 tablet    Refill:  5  . azelastine (ASTELIN) 0.1 % nasal spray    Sig: Two sprays each nostril twice a day as needed for a runny nose.    Dispense:  30 mL    Refill:  5  . Olopatadine HCl 0.2 % SOLN    Sig: One drop each eye  once a day as needed for itchy eyes.    Dispense:  2.5 mL    Refill:  5    Patient Instructions  Moderate persistent asthma Begin Spiriva 1.25 mcg-2 puffs once a  day to prevent cough or wheeze Continue Symbicort 160-to 2 puffs twice a day with a spacer to prevent cough and wheeze Continue montelukast 10 mg once a day to prevent cough and wheeze. Patient cautioned that rarely some children/adults can experience behavioral changes after beginning montelukast. These side effects are rare, however, if you notice any change, notify the clinic and discontinue montelukast. Continue albuterol as needed for cough or wheeze  Seasonal and perennial allergic rhinitis Begin Flonase or Nasocort 1 spray in each nostril twice a day.  In the right nostril, point the applicator out toward the right ear. In the left nostril, point the applicator out toward the left ear Begin saline nasal rinses twice a day for nasal symptoms. Use this before any medicated nasal sprays for best result Begin Xyzal 5 mg once a day as needed for a runny nose or itch. This will replace Claritin Continue allergen avoidance measures Begin azelastine nasal spray 2 sprays in each nostril twice a day as needed for a runny nose. This may help with sinus headache  Allergic conjunctivitis Continue Pazeo one drop once a day as needed for red, itchy eyes  Gastroesophageal reflux disease  Continue lifestyle modifications as you have been Follow up with your gastroenterologist as scheduled  Food intolerance Recommend referral to gastrointestinal specialist for evaluation of gas and bloating  Anaphylactic shock due to food, subsequent encounter Continue to avoid almond milk, peanuts, tree nuts, wheat, and corn products. If you are having an allergic reaction take Benadryl 50 mg and if you have life threatening symptoms inject with EpiPen 0.3 mg and call 911  Follow up in 2 mo or sooner if needed   Return in about 2 months (around  10/14/2019), or if symptoms worsen or fail to improve.    Thank you for the opportunity to care for this patient.  Please do not hesitate to contact me with questions.  Gareth Morgan, FNP [facility]  ________________________________________________  I have provided oversight concerning [name]'s evaluation and treatment of this patient's health issues addressed during today's encounter.  I agree with the assessment and therapeutic plan as outlined in the note.   Signed,   R Edgar Frisk, MD

## 2019-08-16 ENCOUNTER — Telehealth: Payer: Self-pay | Admitting: Family Medicine

## 2019-08-17 DIAGNOSIS — M47812 Spondylosis without myelopathy or radiculopathy, cervical region: Secondary | ICD-10-CM | POA: Diagnosis not present

## 2019-08-19 ENCOUNTER — Other Ambulatory Visit: Payer: Self-pay

## 2019-08-19 MED ORDER — ALBUTEROL SULFATE (2.5 MG/3ML) 0.083% IN NEBU: 2.5000 mg | INHALATION_SOLUTION | Freq: Four times a day (QID) | RESPIRATORY_TRACT | 1 refills | Status: DC | PRN

## 2019-08-22 DIAGNOSIS — G8929 Other chronic pain: Secondary | ICD-10-CM | POA: Diagnosis not present

## 2019-08-22 DIAGNOSIS — Z79899 Other long term (current) drug therapy: Secondary | ICD-10-CM | POA: Diagnosis not present

## 2019-08-22 DIAGNOSIS — M545 Low back pain: Secondary | ICD-10-CM | POA: Diagnosis not present

## 2019-08-26 ENCOUNTER — Other Ambulatory Visit: Payer: Self-pay | Admitting: Family

## 2019-08-29 DIAGNOSIS — M546 Pain in thoracic spine: Secondary | ICD-10-CM | POA: Diagnosis not present

## 2019-08-29 DIAGNOSIS — M542 Cervicalgia: Secondary | ICD-10-CM | POA: Diagnosis not present

## 2019-08-29 DIAGNOSIS — M545 Low back pain: Secondary | ICD-10-CM | POA: Diagnosis not present

## 2019-08-29 DIAGNOSIS — M791 Myalgia, unspecified site: Secondary | ICD-10-CM | POA: Diagnosis not present

## 2019-08-30 ENCOUNTER — Other Ambulatory Visit: Payer: Self-pay

## 2019-08-30 ENCOUNTER — Ambulatory Visit (INDEPENDENT_AMBULATORY_CARE_PROVIDER_SITE_OTHER): Payer: PPO | Admitting: Allergy

## 2019-08-30 ENCOUNTER — Telehealth: Payer: Self-pay

## 2019-08-30 ENCOUNTER — Encounter: Payer: Self-pay | Admitting: Allergy

## 2019-08-30 DIAGNOSIS — J3089 Other allergic rhinitis: Secondary | ICD-10-CM

## 2019-08-30 DIAGNOSIS — J302 Other seasonal allergic rhinitis: Secondary | ICD-10-CM | POA: Diagnosis not present

## 2019-08-30 DIAGNOSIS — T7800XD Anaphylactic reaction due to unspecified food, subsequent encounter: Secondary | ICD-10-CM

## 2019-08-30 DIAGNOSIS — J4541 Moderate persistent asthma with (acute) exacerbation: Secondary | ICD-10-CM

## 2019-08-30 DIAGNOSIS — K219 Gastro-esophageal reflux disease without esophagitis: Secondary | ICD-10-CM | POA: Diagnosis not present

## 2019-08-30 DIAGNOSIS — H1010 Acute atopic conjunctivitis, unspecified eye: Secondary | ICD-10-CM | POA: Diagnosis not present

## 2019-08-30 MED ORDER — IPRATROPIUM-ALBUTEROL 0.5-2.5 (3) MG/3ML IN SOLN: RESPIRATORY_TRACT | 0 refills | Status: DC

## 2019-08-30 NOTE — Telephone Encounter (Signed)
PT called stating she is taking her albuterol q 4 hrs, she does switch it up form her hfa to the nebulizer. She is having shortness of breath, no coughing, no wheezing, some chest tightness. No fever. She is doing the symbicort 2 puffs bid and spiriva 1 puff daily. Pt is still doing all other meds including montelukast. Started a month ago and just is not getting better

## 2019-08-30 NOTE — Telephone Encounter (Signed)
She needs to be seen in person for further evaluation.

## 2019-08-30 NOTE — Assessment & Plan Note (Addendum)
   Continue Pazeo one drop once a day as needed for red, itchy eyes.

## 2019-08-30 NOTE — Progress Notes (Signed)
Follow Up Note  RE: Angela Jimenez MRN: [id number] DOB: 1971/02/27 Date of Office Visit: 08/30/2019  Referring provider: Lyman Bishop, DO Primary care provider: Lyman Bishop, DO  Chief Complaint: Asthma  History of Present Illness: I had the pleasure of seeing Angela Jimenez for a follow up visit at the [facility] on 08/30/2019. She is a 49 y.o. female, who is being followed for moderate persistent asthma, seasonal and perennial allergic rhinitis, seasonal allergic conjunctivitis gastroesophageal reflux disease without esophagitis, anaphylactic shock due to food, and food intolerance. Her previous allergy office visit was on 08/01/2019 with Gareth Morgan, [place]. Today is a new complaint visit of worsening respiratory symptoms.  Asthma is reported as not controlled with the use of Symbicort 160-2 puffs twice a day, Spiriva Respimat 1 puff once a day, montelukast 10 mg once a day, and albuterol as needed.  She has been alternating between her albuterol inhaler and albuterol for her nebulizer about every 4 hours for the past month and at times she will only get relief for about an hour or 2.  She reports shortness of breath and tightness in her chest. Denies any coughing, wheezing, fevers, chills or exposure to COVID-19.  She reports that some days her symptoms will be good in the next day they will bother her.  Her symptoms started a month ago after coming into contact with some mold while sitting in a older car and also being around gas fumes.  Since her last appointment she has not had any trips to the emergency room or urgent care but she did receive a prednisone 5mg  taper on March 29 from her orthopedic doctor for her degenerative disc disease.  This did help her breathing for a short period of time.  She reports that she has seen a cardiologist at [facility] before bed has only had an EKG but not an echocardiogram. Patient reports asthma flares during pollen season.   Her  allergic rhinitis is reported as moderately controlled with Nasacort 1 spray each nostril once a day, saline nasal rinses once a day to twice a day, and Zyrtec 10 mg once a day.  She reports nasal inflammation and occasional postnasal drainage and denies runny nose and sneezing.  In the past she has tried environmental allergen immunotherapy but reports that she had extreme fatigue after getting her injections.  Allergic conjunctivitis is reported as controlled with use of Pazeo eyedrops as needed for itchy watery eyes.  She continues to avoid almond milk, peanuts, tree nuts, wheat, and corn products and has not had any accidental ingestions since her last office visit.  Assessment and Plan: [name] is a 49 y.o. female with: Moderate persistent asthma with acute exacerbation No changes in symptoms since last appointment. Recently had a prednisone taper on 08/19/2019 from orthopedic doctor for degenerative disc disease. Per patient she had cardiac evaluation at [facility] in the past. Patient was only using Spiriva 1 puff daily. Using albuterol or nebulizer every 4 hours with some benefit. No recent infections. Last CXR 1 year ago. Exposure to mold 1 month ago.     ACT score 9.  Today's spirometry was normal with no improvement in FEV1 post bronchodilator treatment and no clinical improvement.   Start prednisone taper  Start Duoneb - use 1 vial via nebulizer twice a day before Symbicort 160 for 5 days.  Increase Spiriva 1.25 mcg-2 puffs once a day to prevent cough or wheeze  Continue Symbicort 160-to 2  puffs twice a day with a spacer to prevent cough and wheeze  Continue montelukast 10 mg once a day to prevent cough and wheeze.   May use albuterol or nebulizer every 4-6 hours as needed.   Stay away from any scents or perfumes  Repeat spirometry at next visit.  If above regimen is not effective, will need to re-evaluate with possible bloodwork, CXR.    Seasonal and perennial allergic  rhinitis Past history: skin testing in 2019 positive to grass, ragweed, weeds, trees and mold. Unable to tolerate environmental allergen immunotherapy due to extreme fatigue. Interim history - some rhinitis symptoms.   Continue Nasacort 1 spray in each nostril twice a day for nasal congestion   Continue saline nasal rinses twice a day for nasal symptoms. Use this before any medicated nasal sprays for best result  Continue Zyrtec 10 mg once a day as needed for a runny nose or itch.   Continue allergen avoidance measures  May azelastine nasal spray 2 sprays in each nostril twice a day as needed for a runny nose.  Gastroesophageal reflux disease without esophagitis Currently not on any medications. Reports burping/food coming up at times.  Continue lifestyle modifications as you have been  Follow up with your gastroenterologist as scheduled  Anaphylactic shock due to adverse food reaction Avoiding:  Almond milk, peanuts, tree nuts, wheat and corn products. No accidental ingestions since last appointment.  Continue to avoid almond milk, peanuts, tree nuts, wheat, and corn products.  If you are having an allergic reaction take Benadryl 50 mg and if you have life threatening symptoms inject with EpiPen 0.3 mg and call 911.  Seasonal allergic conjunctivitis  Continue Pazeo one drop once a day as needed for red, itchy eyes.   Return in about 4 weeks (around 09/27/2019).  Meds ordered this encounter  Medications  . ipratropium-albuterol (DUONEB) 0.5-2.5 (3) MG/3ML SOLN    Sig: 1 vial twice a day for 5 days    Dispense:  30 mL    Refill:  0   Diagnostics: Spirometry:  Tracings reviewed. Her effort: Good reproducible efforts. FVC: 2.49L FEV1: 2.15L, 99% predicted FEV1/FVC ratio: 86% Interpretation: Normal ventilatory function. No significant bronchodilator response which correlates with the reporting of no changes with symptoms. Please see scanned spirometry results for  details.  Medication List:  Current Outpatient Medications  Medication Sig Dispense Refill  . albuterol (PROVENTIL) (2.5 MG/3ML) 0.083% nebulizer solution Take 3 mLs (2.5 mg total) by nebulization every 6 (six) hours as needed for wheezing or shortness of breath. 75 mL 1  . albuterol (VENTOLIN HFA) 108 (90 Base) MCG/ACT inhaler INHALE 2 PUFFS INTO THE LUNGS EVERY 4 HOURS AS NEEDED FOR WHEEZING OR SHORTNESS OF BREATH 18 g 1  . azelastine (ASTELIN) 0.1 % nasal spray Two sprays each nostril twice a day as needed for a runny nose. 30 mL 5  . budesonide-formoterol (SYMBICORT) 160-4.5 MCG/ACT inhaler Inhale 2 puffs into the lungs 2 (two) times daily. 1 Inhaler 5  . Cholecalciferol (VITAMIN D3 PO) Take 2,000 Units by mouth.     . citalopram (CELEXA) 20 MG tablet TK 1 T PO D    . COLLAGEN PO Take 10 g by mouth daily.    . cyclobenzaprine (FLEXERIL) 10 MG tablet Take 10 mg by mouth as needed for muscle spasms.    . diphenhydrAMINE (BENADRYL) 25 MG tablet Take 25 mg by mouth every 4 (four) hours as needed.    Marland Kitchen EPINEPHrine 0.3 mg/0.3 mL IJ  SOAJ injection Use as directed for severe allergic reaction. 2 each 3  . HYDROcodone-acetaminophen (NORCO/VICODIN) 5-325 MG tablet Take 0.5-1 tablets by mouth 2 (two) times daily.    Marland Kitchen levocetirizine (XYZAL) 5 MG tablet Take 1 tablet (5 mg total) by mouth every evening. 30 tablet 5  . Lysine 1000 MG TABS Take 1 tablet by mouth as needed.     . Methylsulfonylmethane (MSM PO) Take 1,000 mg by mouth daily.     . montelukast (SINGULAIR) 10 MG tablet Take 1 tablet (10 mg total) by mouth at bedtime. 30 tablet 5  . Multiple Vitamin (MULTIVITAMIN ADULT PO) Take 3 tablets by mouth daily.    . Olopatadine HCl 0.2 % SOLN One drop each eye once a day as needed for itchy eyes. 2.5 mL 5  . OVER THE COUNTER MEDICATION Mushroon Matcha Drink mix daily    . OVER THE COUNTER MEDICATION Matcha powder mix in food or water and drink once a day    . OVER THE COUNTER MEDICATION Sovereign  Silver immune support take 6 droppers full once a day    . pseudoephedrine (SUDAFED) 30 MG tablet Take 30 mg by mouth every 4 (four) hours as needed for congestion.    . Tiotropium Bromide Monohydrate (SPIRIVA RESPIMAT) 1.25 MCG/ACT AERS Inhale 2 puffs into the lungs daily. 4 g 5  . triamcinolone (NASACORT) 55 MCG/ACT AERO nasal inhaler One spray each nostril twice a day as needed 16.5 g 5  . ipratropium-albuterol (DUONEB) 0.5-2.5 (3) MG/3ML SOLN 1 vial twice a day for 5 days 30 mL 0  . predniSONE (DELTASONE) 10 MG tablet Take 1 tablet once daily for 3 days. (Patient not taking: Reported on 08/14/2019) 3 tablet 0  . promethazine (PHENERGAN) 12.5 MG tablet Take 12.5 mg by mouth every 4 (four) hours as needed.      No current facility-administered medications for this visit.   Allergies: Allergies  Allergen Reactions  . Amoxicillin Anaphylaxis  . Ciprofloxacin Anaphylaxis       . Molds & Smuts Hives and Shortness Of Breath  . Other Anaphylaxis, Hives and Other (See Comments)    Tree nuts, anaphylaxis and migraine headache  . Peanut-Containing Drug Products Anaphylaxis  . Penicillins Anaphylaxis  . Propoxyphene Nausea And Vomiting  . Sulfa Antibiotics Other (See Comments)    ASTHMA ATTACK  . Wheat Bran   . Wool Alcohol [Lanolin] Anaphylaxis    Migraines, asthma, GI upset  . Corn-Containing Products Other (See Comments)    Bloated and gassy  . Latex Hives  . Prednisone Other (See Comments)    "Makes me numb and tingly" increases eye pressure.    I reviewed her past medical history, social history, family history, and environmental history and no significant changes have been reported from her previous visit.  Review of Systems  Constitutional: Negative for chills and fever.  HENT: Positive for congestion and postnasal drip.   Eyes:       Reports occasional itchy/watery eyes  Respiratory: Positive for chest tightness and shortness of breath. Negative for cough and wheezing.         Reports chest tightness  Allergic/Immunologic: Positive for environmental allergies and food allergies.   Objective: BP 98/70   Pulse 86   Temp 98.4 F (36.9 C) (Oral)   Resp (!) 24   SpO2 98%  There is no height or weight on file to calculate BMI. Physical Exam  Constitutional: She is oriented to person, place, and time. She appears  well-developed and well-nourished.  HENT:  Head: Normocephalic and atraumatic.  Right Ear: External ear normal.  Left Ear: External ear normal.  Nose: Nose normal.  Mouth/Throat: Oropharynx is clear and moist.  Eyes: Conjunctivae and EOM are normal.  Cardiovascular: Normal rate, regular rhythm and normal heart sounds. Exam reveals no gallop and no friction rub.  No murmur heard. Pulmonary/Chest: Effort normal and breath sounds normal. She has no wheezes. She has no rales.  Lungs clear to auscultation  Musculoskeletal:     Cervical back: Neck supple.  Neurological: She is alert and oriented to person, place, and time.  Skin: Skin is warm. No rash noted.  Psychiatric: She has a normal mood and affect. Her behavior is normal.  Nursing note and vitals reviewed.  Previous notes and tests were reviewed. The plan was reviewed with the patient/family, and all questions/concerned were addressed.  It was my pleasure to see Shaunie today and participate in her care. Please feel free to contact me with any questions or concerns.  Sincerely,  Rexene Alberts, DO Allergy & Immunology  [facility] office: [phone] [facility] office: [place] office: [phone]

## 2019-08-30 NOTE — Patient Instructions (Addendum)
Asthma Start prednisone taper Start Duoneb - use 1 vial via nebulizer twice a day before Symbicort 160 for 5 days. Increase Spiriva 1.25 mcg-2 puffs once a day to prevent cough or wheeze Continue Symbicort 160-to 2 puffs twice a day with a spacer to prevent cough and wheeze Continue montelukast 10 mg once a day to prevent cough and wheeze.  May use albuterol or nebulizer every 4-6 hours as needed.  Stay away from any scents or perfumes  Seasonal and perennial allergic rhinitis Continue Nasocort 1 spray in each nostril twice a day for nasal congestion  Continue saline nasal rinses twice a day for nasal symptoms. Use this before any medicated nasal sprays for best result Continue Zyrtec 10 mg once a day as needed for a runny nose or itch.  Continue allergen avoidance measures May azelastine nasal spray 2 sprays in each nostril twice a day as needed for a runny nose.   Allergic conjunctivitis Continue Pazeo one drop once a day as needed for red, itchy eyes  Gastroesophageal reflux disease  Continue lifestyle modifications as you have been Follow up with your gastroenterologist as scheduled  Anaphylactic shock due to food, subsequent encounter Continue to avoid almond milk, peanuts, tree nuts, wheat, and corn products. If you are having an allergic reaction take Benadryl 50 mg and if you have life threatening symptoms inject with EpiPen 0.3 mg and call 911    Follow up in 1 month or sooner if needed

## 2019-08-30 NOTE — Assessment & Plan Note (Addendum)
Avoiding:  Almond milk, peanuts, tree nuts, wheat and corn products. No accidental ingestions since last appointment.  Continue to avoid almond milk, peanuts, tree nuts, wheat, and corn products.  If you are having an allergic reaction take Benadryl 50 mg and if you have life threatening symptoms inject with EpiPen 0.3 mg and call 911.

## 2019-08-30 NOTE — Telephone Encounter (Signed)
Pt is scheduled to come in at 330 today

## 2019-08-30 NOTE — Assessment & Plan Note (Addendum)
Currently not on any medications. Reports burping/food coming up at times.  Continue lifestyle modifications as you have been  Follow up with your gastroenterologist as scheduled

## 2019-08-30 NOTE — Assessment & Plan Note (Addendum)
Past history: skin testing in 2019 positive to grass, ragweed, weeds, trees and mold. Unable to tolerate environmental allergen immunotherapy due to extreme fatigue. Interim history - some rhinitis symptoms.   Continue Nasacort 1 spray in each nostril twice a day for nasal congestion   Continue saline nasal rinses twice a day for nasal symptoms. Use this before any medicated nasal sprays for best result  Continue Zyrtec 10 mg once a day as needed for a runny nose or itch.   Continue allergen avoidance measures  May azelastine nasal spray 2 sprays in each nostril twice a day as needed for a runny nose.

## 2019-08-30 NOTE — Assessment & Plan Note (Addendum)
No changes in symptoms since last appointment. Recently had a prednisone taper on 08/19/2019 from orthopedic doctor for degenerative disc disease. Per patient she had cardiac evaluation at [facility] in the past. Patient was only using Spiriva 1 puff daily. Using albuterol or nebulizer every 4 hours with some benefit. No recent infections. Last CXR 1 year ago. Exposure to mold 1 month ago.     ACT score 9.  Today's spirometry was normal with no improvement in FEV1 post bronchodilator treatment and no clinical improvement.   Start prednisone taper  Start Duoneb - use 1 vial via nebulizer twice a day before Symbicort 160 for 5 days.  Increase Spiriva 1.25 mcg-2 puffs once a day to prevent cough or wheeze  Continue Symbicort 160-to 2 puffs twice a day with a spacer to prevent cough and wheeze  Continue montelukast 10 mg once a day to prevent cough and wheeze.   May use albuterol or nebulizer every 4-6 hours as needed.   Stay away from any scents or perfumes  Repeat spirometry at next visit.  If above regimen is not effective, will need to re-evaluate with possible bloodwork, CXR.

## 2019-09-02 ENCOUNTER — Other Ambulatory Visit: Payer: Self-pay

## 2019-09-02 DIAGNOSIS — M542 Cervicalgia: Secondary | ICD-10-CM | POA: Diagnosis not present

## 2019-09-02 DIAGNOSIS — M545 Low back pain: Secondary | ICD-10-CM | POA: Diagnosis not present

## 2019-09-02 NOTE — Telephone Encounter (Signed)
Please call patient.  Is she getting any better? Taking all the meds as prescribed - prednisone, duoneb, spiriva, symbicort?  If this was asthma exacerbation, it should be getting better. Fevers? Chills?   Now concerning if there's something else going on. Did she get tested for COVID? Can she come in for evaluation? If she can't come in tomorrow then she may need to go to urgent care/ER if it gets worse.  She may need CXR too. Does she want [place] to order today or doe she want to go to ER/Urgent care? Or does she want to come in tomorrow?

## 2019-09-02 NOTE — Telephone Encounter (Signed)
Patient is still having breathing problems. Chest is sore, raspy voice, and shortness of breath. Taking all meds as prescribed. Please advise

## 2019-09-03 ENCOUNTER — Other Ambulatory Visit: Payer: Self-pay

## 2019-09-03 DIAGNOSIS — M545 Low back pain: Secondary | ICD-10-CM | POA: Diagnosis not present

## 2019-09-03 DIAGNOSIS — M542 Cervicalgia: Secondary | ICD-10-CM | POA: Diagnosis not present

## 2019-09-03 MED ORDER — ALBUTEROL SULFATE (2.5 MG/3ML) 0.083% IN NEBU: 2.5000 mg | INHALATION_SOLUTION | Freq: Four times a day (QID) | RESPIRATORY_TRACT | 1 refills | Status: DC | PRN

## 2019-09-03 NOTE — Telephone Encounter (Signed)
Ok refill albuterol neb sol.  Last ov 08/27/19.  Sent rx into pharmacy.

## 2019-09-04 ENCOUNTER — Encounter: Payer: Self-pay | Admitting: Pediatrics

## 2019-09-04 ENCOUNTER — Other Ambulatory Visit: Payer: Self-pay

## 2019-09-04 ENCOUNTER — Ambulatory Visit (INDEPENDENT_AMBULATORY_CARE_PROVIDER_SITE_OTHER): Payer: PPO | Admitting: Pediatrics

## 2019-09-04 ENCOUNTER — Ambulatory Visit (HOSPITAL_BASED_OUTPATIENT_CLINIC_OR_DEPARTMENT_OTHER)
Admission: RE | Admit: 2019-09-04 | Discharge: 2019-09-04 | Disposition: A | Discharge status: Home / Self Care | Payer: PPO | Source: Ambulatory Visit | Attending: Pediatrics | Admitting: Pediatrics

## 2019-09-04 VITALS — BP 122/78 | HR 78 | Temp 98.1°F | Resp 16 | Ht 61.7 in | Wt 139.4 lb

## 2019-09-04 DIAGNOSIS — J3089 Other allergic rhinitis: Secondary | ICD-10-CM | POA: Diagnosis not present

## 2019-09-04 DIAGNOSIS — J4541 Moderate persistent asthma with (acute) exacerbation: Secondary | ICD-10-CM | POA: Insufficient documentation

## 2019-09-04 DIAGNOSIS — J45909 Unspecified asthma, uncomplicated: Secondary | ICD-10-CM | POA: Diagnosis not present

## 2019-09-04 DIAGNOSIS — H1010 Acute atopic conjunctivitis, unspecified eye: Secondary | ICD-10-CM

## 2019-09-04 DIAGNOSIS — K219 Gastro-esophageal reflux disease without esophagitis: Secondary | ICD-10-CM | POA: Diagnosis not present

## 2019-09-04 DIAGNOSIS — T7800XD Anaphylactic reaction due to unspecified food, subsequent encounter: Secondary | ICD-10-CM

## 2019-09-04 DIAGNOSIS — J302 Other seasonal allergic rhinitis: Secondary | ICD-10-CM

## 2019-09-04 MED ORDER — OMEPRAZOLE 20 MG PO CPDR: DELAYED_RELEASE_CAPSULE | ORAL | 5 refills | Status: DC

## 2019-09-04 MED ORDER — TRIAMCINOLONE ACETONIDE 55 MCG/ACT NA AERO: INHALATION_SPRAY | NASAL | 5 refills | Status: DC

## 2019-09-04 NOTE — Telephone Encounter (Addendum)
Pt states she is taking all meds as prescribed. Not having fever or chills. She thinks her allergies are making her asthma worse. She is using albuterol every 4 hours around the clock, even at night time. I scheduled her to come in for appt at 11:20 with Dr. Shaune Leeks.

## 2019-09-04 NOTE — Patient Instructions (Addendum)
Asthma Stop Duoneb Continue Symbicort 160-take 2 puffs twice a day with spacer to help prevent cough or wheeze.  Make sure to rinse out mouth afterwards to help prevent thrush. Continue Spiriva Respimat 1.25-take 2 puffs once a day to help prevent cough or wheeze Continue montelukast 10 mg 1 tablet once a day to help prevent cough and wheeze May use albuterol 2 puffs every 4 hours as needed for cough, wheeze, tightness in chest or shortness of breath. Or may use albuterol 0.083% 1 unit dose every 4 hours as needed for wheezing, tightness in chest, cough and shortness of breath. Also may use albuterol 2 puffs 5-15 minutes prior to exercise. Check CBC with diff and serum IgE to see if she qualifies for a biologic medication to help with asthma symptoms. We will call you with these results.  Get chest x-ray  Allergic Rhinitis Continue Nasacort nasal spray 1 spray each nostril twice a day as needed to help prevent stuffy nose. Continue azelastine nasal spray- 2 sprays each nostril twice a day as needed to help with runny nose. Continue Zyrtec 10 mg once as needed a day to help prevent runny nose and itch Continue saline nasal rinse as needed to help with nasal symptoms.  Use the saline nasal rinse prior to any medicated nasal sprays. Continue allergen avoidance measures  Allergic Conjunctivitis Continue Pazeo eyedrops 1 drop each eye once a day as needed for itchy eyes.  Reflux Start omeprazole 20 mg- take one tablet twice a day to help with hoarseness  Food Allergy Get serum IgE to corn. We will call you with these results. Continue to avoid almond milk, peanuts, tree nuts, wheat, and corn products.  In case of an allergic reaction, give Benadryl 50mg  every 4 hours, and if life-threatening symptoms occur, inject with EpiPen 0.3 mg.  Continue all other medications. Please let us know if this treatment plan is not helping you.   Keep already scheduled follow appointment that is in one  month

## 2019-09-04 NOTE — Progress Notes (Signed)
[place] [address] Dept: [phone]  FOLLOW UP NOTE  Patient ID: Angela Jimenez, female    DOB: 07/07/1970  Age: 49 y.o. MRN: [id number] Date of Office Visit: 09/04/2019  Assessment  Chief Complaint: Asthma and Shortness of Breath (chest hurts.  sx since last OV on 08/30/19.)  HPI MILAYA NOVOSEL presents for follow-up of asthma, allergic rhinitis, allergic conjunctivitis, reflux, and food allergy.  She was last seen on August 30, 2019 by Dr. Maudie Mercury.  Her asthma is reported as not well controlled with the use of Symbicort 160/4.5 - 2 puffs twice a day, Spiriva Respimat 1.25 mcg 2 puffs once a day, Singulair 10 mg once a day, Duoneb and albuterol.  She reports that she took the last dose of her prednisone taper this morning. She reports that the prednisone helped some but not enough.  She is currently using her albuterol every 4 hours with some relief.  She reports shortness of breath, soreness in her chest, a little bit of wheeze and tightness but denies any radiation of pain to her arms or jaw,coughing, fevers, chills or sick exposure.  She is unsure if she has had an echocardiogram, but reports she will check with her cardiologist Dr. Edwin Dada  at [facility]  She reports that she last saw him in January 2021. Her allergic rhinitis is reported as not well controlled with the use of Nasacort 1 spray each nostril twice a day, Zyrtec 10 mg once a day, saline rinses once a day and azelastine 2 sprays each nostril twice a day as needed.  She reports that she was unable to tolerate allergen immunotherapy in the past due to extreme fatigue.  She reports sinus tenderness and pressure and denies any runny nose, stuffy nose sneezing and itching.  Allergic conjunctivitis is reported as controlled with the use of Paseo eyedrops 1 drop each eye once a day as needed.  She reports occasional itchy eyes . Reflux is reported as not well controlled with the use of acidophilus or apple cider vinegar.  She  reports  hoarseness of voice that comes and goes and is worse in the morning and also worse when she is outside.  She denies any globulus sensation.  Her hoarseness has been going on for 1 to 2 weeks.  She reports that she has tried reflux medicines in the past and they did not help  due to 2 the corn fillers that a part of the medication.  When she eats corn she reports a gas sensation.  She is willing to try low-dose omeprazole today. She continues to avoid almond milk, peanuts, tree nuts, wheat and corn products.  She has not had any accidental ingestions or need to use her epinephrine device since her last appointment.    Drug Allergies:  Allergies  Allergen Reactions  . Amoxicillin Anaphylaxis  . Ciprofloxacin Anaphylaxis       . Molds & Smuts Hives and Shortness Of Breath  . Other Anaphylaxis, Hives and Other (See Comments)    Tree nuts, anaphylaxis and migraine headache  . Peanut-Containing Drug Products Anaphylaxis  . Penicillins Anaphylaxis  . Propoxyphene Nausea And Vomiting  . Sulfa Antibiotics Other (See Comments)    ASTHMA ATTACK  . Wheat Bran   . Wool Alcohol [Lanolin] Anaphylaxis    Migraines, asthma, GI upset  . Corn-Containing Products Other (See Comments)    Bloated and gassy  . Latex Hives  . Prednisone Other (See Comments)    "Makes  me numb and tingly" increases eye pressure.     Physical Exam: BP 122/78 (BP Location: Right Arm, Patient Position: Sitting, Cuff Size: Normal)   Pulse 78   Temp 98.1 F (36.7 C) (Temporal)   Resp 16   Ht 5' 1.7" (1.567 m)   Wt 139 lb 6.4 oz (63.2 kg)   SpO2 98%   BMI 25.75 kg/m    Physical Exam Constitutional:      Appearance: She is well-developed.  HENT:     Head: Normocephalic and atraumatic.     Comments: Pharynx normal. Eyes normal. Ears normal. Nasal cavity: slightly erythematous and mildly edematous    Mouth/Throat:     Mouth: Mucous membranes are moist.  Cardiovascular:     Rate and Rhythm: Normal rate and  regular rhythm.     Heart sounds: Normal heart sounds.  Pulmonary:     Effort: Pulmonary effort is normal.     Breath sounds: Normal breath sounds.     Comments: Lungs clear to auscultation. Skin:    General: Skin is warm.  Neurological:     General: No focal deficit present.     Mental Status: She is alert and oriented to person, place, and time.  Psychiatric:        Mood and Affect: Mood normal.        Behavior: Behavior normal.     Diagnostics: FVC 2.33 L, FEV1 1.97 L.  Predicted FVC 2.70 L FEV1 2.18 L.  Spirometry indicates normal ventilatory function.    Assessment and Plan: 1. Moderate persistent asthma with acute exacerbation   2. Gastroesophageal reflux disease without esophagitis   3. Seasonal and perennial allergic rhinitis   4. Seasonal allergic conjunctivitis   5. Anaphylaxis due to food, subsequent encounter     Meds ordered this encounter  Medications  . omeprazole (PRILOSEC) 20 MG capsule    Sig: One capsule twice a day for reflux.    Dispense:  60 capsule    Refill:  5  . triamcinolone (NASACORT) 55 MCG/ACT AERO nasal inhaler    Sig: One spray each nostril twice a day as needed    Dispense:  16.5 g    Refill:  5    Patient Instructions  Asthma Stop Duoneb Continue Symbicort 160-take 2 puffs twice a day with spacer to help prevent cough or wheeze.  Make sure to rinse out mouth afterwards to help prevent thrush. Continue Spiriva Respimat 1.25-take 2 puffs once a day to help prevent cough or wheeze Continue montelukast 10 mg 1 tablet once a day to help prevent cough and wheeze May use albuterol 2 puffs every 4 hours as needed for cough, wheeze, tightness in chest or shortness of breath. Or may use albuterol 0.083% 1 unit dose every 4 hours as needed for wheezing, tightness in chest, cough and shortness of breath. Also may use albuterol 2 puffs 5-15 minutes prior to exercise. Check CBC with diff and serum IgE to see if she qualifies for a biologic medication  to help with asthma symptoms. We will call you with these results.  Get chest x-ray  Allergic Rhinitis Continue Nasacort nasal spray 1 spray each nostril twice a day as needed to help prevent stuffy nose. Continue azelastine nasal spray- 2 sprays each nostril twice a day as needed to help with runny nose. Continue Zyrtec 10 mg once as needed a day to help prevent runny nose and itch Continue saline nasal rinse as needed to help with nasal symptoms.  Use the saline nasal rinse prior to any medicated nasal sprays. Continue allergen avoidance measures  Allergic Conjunctivitis Continue Pazeo eyedrops 1 drop each eye once a day as needed for itchy eyes.  Reflux Start omeprazole 20 mg- take one tablet twice a day to help with hoarseness  Food Allergy Get serum IgE to corn. We will call you with these results. Continue to avoid almond milk, peanuts, tree nuts, wheat, and corn products.  In case of an allergic reaction, give Benadryl 50mg  every 4 hours, and if life-threatening symptoms occur, inject with EpiPen 0.3 mg.  Continue all other medications. Please let us know if this treatment plan is not helping you.   Keep already scheduled follow appointment that is in one month    Return in about 4 weeks (around 10/02/2019), or if symptoms worsen or fail to improve.   You for the opportunity to care for this patient.  Please do not hesitate to call me with any questions.  [facility]   I have provided oversight concerning Enis Gash' evaluation and treatment of this patient's health issues addressed during today's encounter. I agree with the assessment and therapeutic plan as outlined in the note.   Thank you for the opportunity to care for this patient.  Please do not hesitate to contact me with questions.  Penne Lash, M.D.  [facility] [address] [place], [place]  [address] [phone]

## 2019-09-05 ENCOUNTER — Telehealth: Payer: Self-pay

## 2019-09-05 DIAGNOSIS — Z20822 Contact with and (suspected) exposure to covid-19: Secondary | ICD-10-CM | POA: Diagnosis not present

## 2019-09-05 DIAGNOSIS — J45909 Unspecified asthma, uncomplicated: Secondary | ICD-10-CM | POA: Diagnosis not present

## 2019-09-05 NOTE — Telephone Encounter (Signed)
Pt called in stating she tried cutting back on the duoneb and missed several dose yesterday and during the night and this morning she is having a hard time breathing do you still want her to miss some of those doses or add them back in?

## 2019-09-05 NOTE — Telephone Encounter (Signed)
Informed pt of what dr b has stated.she will go tomorrow and get lab drawn and let us know in a few days how she is doing

## 2019-09-05 NOTE — Telephone Encounter (Signed)
Instead of the DuoNeb, she may use albuterol 0.083% - 1 unit dose every 4 hours if needed.  Her Spiriva contains one of the ingredients in the DuoNeb.  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

## 2019-09-06 DIAGNOSIS — J45909 Unspecified asthma, uncomplicated: Secondary | ICD-10-CM | POA: Diagnosis not present

## 2019-09-06 DIAGNOSIS — Z9109 Other allergy status, other than to drugs and biological substances: Secondary | ICD-10-CM | POA: Diagnosis not present

## 2019-09-06 DIAGNOSIS — T7800XD Anaphylactic reaction due to unspecified food, subsequent encounter: Secondary | ICD-10-CM | POA: Diagnosis not present

## 2019-09-06 DIAGNOSIS — R0602 Shortness of breath: Secondary | ICD-10-CM | POA: Diagnosis not present

## 2019-09-06 DIAGNOSIS — J4541 Moderate persistent asthma with (acute) exacerbation: Secondary | ICD-10-CM | POA: Diagnosis not present

## 2019-09-11 LAB — CBC WITH DIFFERENTIAL/PLATELET
Basophils Absolute: 0.1 10*3/uL (ref 0.0–0.2)
Basos: 1 %
EOS (ABSOLUTE): 0.1 10*3/uL (ref 0.0–0.4)
Eos: 2 %
Hematocrit: 40.7 % (ref 34.0–46.6)
Hemoglobin: 13.4 g/dL (ref 11.1–15.9)
Immature Grans (Abs): 0 10*3/uL (ref 0.0–0.1)
Immature Granulocytes: 1 %
Lymphocytes Absolute: 2.7 10*3/uL (ref 0.7–3.1)
Lymphs: 43 %
MCH: 31.5 pg (ref 26.6–33.0)
MCHC: 32.9 g/dL (ref 31.5–35.7)
MCV: 96 fL (ref 79–97)
Monocytes Absolute: 0.3 10*3/uL (ref 0.1–0.9)
Monocytes: 5 %
Neutrophils Absolute: 3 10*3/uL (ref 1.4–7.0)
Neutrophils: 48 %
Platelets: 243 10*3/uL (ref 150–450)
RBC: 4.25 x10E6/uL (ref 3.77–5.28)
RDW: 13.3 % (ref 11.7–15.4)
WBC: 6.3 10*3/uL (ref 3.4–10.8)

## 2019-09-11 LAB — IGE: IgE (Immunoglobulin E), Serum: 10 IU/mL (ref 6–495)

## 2019-09-11 LAB — ALLERGEN, CORN F8: Allergen Corn, IgE: <0.1 kU/L

## 2019-09-13 DIAGNOSIS — R011 Cardiac murmur, unspecified: Secondary | ICD-10-CM | POA: Diagnosis not present

## 2019-09-15 DIAGNOSIS — K5909 Other constipation: Secondary | ICD-10-CM | POA: Diagnosis not present

## 2019-09-16 ENCOUNTER — Telehealth: Payer: Self-pay | Admitting: Gastroenterology

## 2019-09-16 NOTE — Telephone Encounter (Signed)
Saw Urgent Care doctor yesterday and gave her Linzess 72 mg and 145 mg. She took the 72 mg yesterday at 5 pm.

## 2019-09-16 NOTE — Telephone Encounter (Signed)
Patient called and is having severe constipation since Friday. Has taken dolcalax and enema to no relief. Tainia and having abd pain w/ cramping. Went to urgent care yesterday. What can she do? Please call patient.

## 2019-09-17 DIAGNOSIS — R109 Unspecified abdominal pain: Secondary | ICD-10-CM | POA: Diagnosis not present

## 2019-09-17 DIAGNOSIS — Z882 Allergy status to sulfonamides status: Secondary | ICD-10-CM | POA: Diagnosis not present

## 2019-09-17 DIAGNOSIS — Z91018 Allergy to other foods: Secondary | ICD-10-CM | POA: Diagnosis not present

## 2019-09-17 DIAGNOSIS — F419 Anxiety disorder, unspecified: Secondary | ICD-10-CM | POA: Diagnosis not present

## 2019-09-17 DIAGNOSIS — Z91040 Latex allergy status: Secondary | ICD-10-CM | POA: Diagnosis not present

## 2019-09-17 DIAGNOSIS — K219 Gastro-esophageal reflux disease without esophagitis: Secondary | ICD-10-CM | POA: Diagnosis not present

## 2019-09-17 DIAGNOSIS — J45909 Unspecified asthma, uncomplicated: Secondary | ICD-10-CM | POA: Diagnosis not present

## 2019-09-17 DIAGNOSIS — Z880 Allergy status to penicillin: Secondary | ICD-10-CM | POA: Diagnosis not present

## 2019-09-17 DIAGNOSIS — Z9109 Other allergy status, other than to drugs and biological substances: Secondary | ICD-10-CM | POA: Diagnosis not present

## 2019-09-17 DIAGNOSIS — K5900 Constipation, unspecified: Secondary | ICD-10-CM | POA: Diagnosis not present

## 2019-09-17 DIAGNOSIS — R0602 Shortness of breath: Secondary | ICD-10-CM | POA: Diagnosis not present

## 2019-09-17 DIAGNOSIS — R1031 Right lower quadrant pain: Secondary | ICD-10-CM | POA: Diagnosis not present

## 2019-09-17 DIAGNOSIS — Z888 Allergy status to other drugs, medicaments and biological substances status: Secondary | ICD-10-CM | POA: Diagnosis not present

## 2019-09-17 DIAGNOSIS — Z91010 Allergy to peanuts: Secondary | ICD-10-CM | POA: Diagnosis not present

## 2019-09-17 DIAGNOSIS — Z881 Allergy status to other antibiotic agents status: Secondary | ICD-10-CM | POA: Diagnosis not present

## 2019-09-17 DIAGNOSIS — R1084 Generalized abdominal pain: Secondary | ICD-10-CM | POA: Diagnosis not present

## 2019-09-17 DIAGNOSIS — E039 Hypothyroidism, unspecified: Secondary | ICD-10-CM | POA: Diagnosis not present

## 2019-09-17 NOTE — Telephone Encounter (Signed)
Pt advised to go ahead and take the Linzess 171mcg. If no improvement, she can increase to the max dose of 295mcg. Pt will try this for a couple of days. If no improvement with that increase, pt will call back for further instructions.

## 2019-09-18 MED ORDER — SODIUM CHLORIDE 0.9 % IV SOLN
10.00 | INTRAVENOUS | Status: DC
Start: ? — End: 2019-09-18

## 2019-09-18 MED ORDER — GENERIC EXTERNAL MEDICATION
Status: DC
Start: ? — End: 2019-09-18

## 2019-09-22 DIAGNOSIS — R509 Fever, unspecified: Secondary | ICD-10-CM | POA: Diagnosis not present

## 2019-09-22 DIAGNOSIS — Z9189 Other specified personal risk factors, not elsewhere classified: Secondary | ICD-10-CM | POA: Diagnosis not present

## 2019-09-22 DIAGNOSIS — R112 Nausea with vomiting, unspecified: Secondary | ICD-10-CM | POA: Diagnosis not present

## 2019-09-22 DIAGNOSIS — A059 Bacterial foodborne intoxication, unspecified: Secondary | ICD-10-CM | POA: Diagnosis not present

## 2019-09-23 ENCOUNTER — Other Ambulatory Visit: Payer: Self-pay

## 2019-09-23 MED ORDER — ALBUTEROL SULFATE HFA 108 (90 BASE) MCG/ACT IN AERS: INHALATION_SPRAY | RESPIRATORY_TRACT | 1 refills | Status: DC

## 2019-09-23 NOTE — Telephone Encounter (Signed)
Refill for Albuterol HFA x 1 with 1 refill at [facility]

## 2019-09-25 ENCOUNTER — Other Ambulatory Visit: Payer: Self-pay | Admitting: Family Medicine

## 2019-09-25 ENCOUNTER — Other Ambulatory Visit: Payer: Self-pay

## 2019-09-25 MED ORDER — ALBUTEROL SULFATE HFA 108 (90 BASE) MCG/ACT IN AERS: 2.0000 | INHALATION_SPRAY | RESPIRATORY_TRACT | 1 refills | Status: DC | PRN

## 2019-09-25 NOTE — Telephone Encounter (Signed)
Insurance not covering Albuterol. Changing  Albuterol HFA to ProAir HFA.

## 2019-10-02 DIAGNOSIS — M545 Low back pain: Secondary | ICD-10-CM | POA: Diagnosis not present

## 2019-10-02 DIAGNOSIS — M542 Cervicalgia: Secondary | ICD-10-CM | POA: Diagnosis not present

## 2019-10-03 DIAGNOSIS — Z79899 Other long term (current) drug therapy: Secondary | ICD-10-CM | POA: Diagnosis not present

## 2019-10-03 DIAGNOSIS — M542 Cervicalgia: Secondary | ICD-10-CM | POA: Diagnosis not present

## 2019-10-03 DIAGNOSIS — M545 Low back pain: Secondary | ICD-10-CM | POA: Diagnosis not present

## 2019-10-03 DIAGNOSIS — F329 Major depressive disorder, single episode, unspecified: Secondary | ICD-10-CM | POA: Diagnosis not present

## 2019-10-03 DIAGNOSIS — F419 Anxiety disorder, unspecified: Secondary | ICD-10-CM | POA: Diagnosis not present

## 2019-10-15 NOTE — Progress Notes (Deleted)
   [place] [address] Dept: [phone]  FOLLOW UP NOTE  Patient ID: Angela Jimenez, female    DOB: Jun 15, 1970  Age: 49 y.o. MRN: [id number] Date of Office Visit: 10/16/2019  Assessment  Chief Complaint: No chief complaint on file.  HPI Angela Jimenez    Drug Allergies:  Allergies  Allergen Reactions  . Amoxicillin Anaphylaxis  . Ciprofloxacin Anaphylaxis       . Molds & Smuts Hives and Shortness Of Breath  . Other Anaphylaxis, Hives and Other (See Comments)    Tree nuts, anaphylaxis and migraine headache  . Peanut-Containing Drug Products Anaphylaxis  . Penicillins Anaphylaxis  . Propoxyphene Nausea And Vomiting  . Sulfa Antibiotics Other (See Comments)    ASTHMA ATTACK  . Wheat Bran   . Wool Alcohol [Lanolin] Anaphylaxis    Migraines, asthma, GI upset  . Corn-Containing Products Other (See Comments)    Bloated and gassy  . Latex Hives  . Prednisone Other (See Comments)    "Makes me numb and tingly" increases eye pressure.     Physical Exam: There were no vitals taken for this visit.   Physical Exam  Diagnostics:    Assessment and Plan: No diagnosis found.  No orders of the defined types were placed in this encounter.   There are no Patient Instructions on file for this visit.  No follow-ups on file.    Thank you for the opportunity to care for this patient.  Please do not hesitate to contact me with questions.  Gareth Morgan, FNP [facility]

## 2019-10-16 ENCOUNTER — Ambulatory Visit: Payer: PPO | Admitting: Family Medicine

## 2019-10-17 ENCOUNTER — Inpatient Hospital Stay: Payer: PPO | Admitting: Family

## 2019-10-17 ENCOUNTER — Inpatient Hospital Stay: Payer: PPO | Attending: Family Medicine

## 2019-10-17 DIAGNOSIS — Z9109 Other allergy status, other than to drugs and biological substances: Secondary | ICD-10-CM | POA: Diagnosis not present

## 2019-10-18 DIAGNOSIS — J45909 Unspecified asthma, uncomplicated: Secondary | ICD-10-CM | POA: Diagnosis not present

## 2019-10-18 DIAGNOSIS — J3089 Other allergic rhinitis: Secondary | ICD-10-CM | POA: Diagnosis not present

## 2019-10-18 DIAGNOSIS — R0602 Shortness of breath: Secondary | ICD-10-CM | POA: Diagnosis not present

## 2019-10-23 ENCOUNTER — Other Ambulatory Visit: Payer: Self-pay

## 2019-10-23 ENCOUNTER — Telehealth (INDEPENDENT_AMBULATORY_CARE_PROVIDER_SITE_OTHER): Payer: PPO | Admitting: Gastroenterology

## 2019-10-23 ENCOUNTER — Encounter: Payer: Self-pay | Admitting: Gastroenterology

## 2019-10-23 DIAGNOSIS — K5904 Chronic idiopathic constipation: Secondary | ICD-10-CM | POA: Diagnosis not present

## 2019-10-23 NOTE — Progress Notes (Signed)
Angela Lame, MD [address]  [facility]  [place], [place] [address]  Main: [phone]  Fax: [phone]    Gastroenterology Virtual/Video Visit  Referring Provider:     Lyman Bishop, DO Primary Care Physician:  Lyman Bishop, DO Primary Gastroenterologist:  Dr.Yvette Loveless Allen Norris Reason for Consultation:     Constipation        HPI:    Virtual Visit via Video Note Location of the patient: Constipation Location of provider: Office  Participating persons: The patient and myself.  I connected with Verdie Drown on 10/23/19 at 10:00 AM EDT by a video enabled telemedicine application and verified that I am speaking with the correct person using two identifiers.   I discussed the limitations of evaluation and management by telemedicine and the availability of in person appointments. The patient expressed understanding and agreed to proceed.  Verbal consent to proceed obtained.  History of Present Illness: Angela Jimenez is a 49 y.o. female referred by Dr. Curly Rim, Adele Barthel, DO  for consultation & management of constipation.  Patient reports that she did try prune juice and has been taking 72 mcg of Linzess about every other day in addition to her MiraLAX about every other day.  She reports that she was seen in the ED twice recently for her constipation.  The patient again reports multiple food allergies and intolerances.  She now states that rich foods are also bothering her.  Past Medical History:  Diagnosis Date  . Asthma   . Bulging lumbar disc    between c3 and c4   . Iron malabsorption 09/08/2017  . Menometrorrhagia 09/08/2017  . Trigger finger 2020    Past Surgical History:  Procedure Laterality Date  . MYOMECTOMY ABDOMINAL APPROACH      Prior to Admission medications   Medication Sig Start Date End Date Taking? Authorizing Provider  albuterol (PROAIR HFA) 108 (90 Base) MCG/ACT inhaler Inhale 2 puffs into the lungs every 4 (four) hours as needed (for  coughing or wheezing spells). 09/25/19  Yes Ambs, Kathrine Cords, FNP  albuterol (PROVENTIL) (2.5 MG/3ML) 0.083% nebulizer solution Take 3 mLs (2.5 mg total) by nebulization every 6 (six) hours as needed for wheezing or shortness of breath. 09/03/19  Yes Ambs, Kathrine Cords, FNP  albuterol (VENTOLIN HFA) 108 (90 Base) MCG/ACT inhaler INHALE 2 PUFFS INTO THE LUNGS EVERY 4 HOURS AS NEEDED FOR WHEEZING OR SHORTNESS OF BREATH 09/23/19  Yes Ambs, Kathrine Cords, FNP  azelastine (ASTELIN) 0.1 % nasal spray Two sprays each nostril twice a day as needed for a runny nose. 08/14/19  Yes Ambs, Kathrine Cords, FNP  budesonide-formoterol (SYMBICORT) 160-4.5 MCG/ACT inhaler Inhale 2 puffs into the lungs 2 (two) times daily. 08/14/19  Yes Ambs, Kathrine Cords, FNP  cetirizine (ZYRTEC) 10 MG tablet Take 10 mg by mouth every evening.   Yes [provider]  Cholecalciferol (VITAMIN D3 PO) Take 2,000 Units by mouth.    Yes [provider]  COLLAGEN PO Take 10 g by mouth daily.   Yes [provider]  cyclobenzaprine (FLEXERIL) 10 MG tablet Take 10 mg by mouth as needed for muscle spasms.   Yes [provider]  diphenhydrAMINE (BENADRYL) 25 MG tablet Take 25 mg by mouth every 4 (four) hours as needed.   Yes [provider]  EPINEPHrine 0.3 mg/0.3 mL IJ SOAJ injection Use as directed for severe allergic reaction. 08/14/19  Yes Ambs, Kathrine Cords, FNP  HYDROcodone-acetaminophen (NORCO/VICODIN) 5-325 MG tablet Take  0.5-1 tablets by mouth 2 (two) times daily. 06/11/19  Yes [provider]  ipratropium-albuterol (DUONEB) 0.5-2.5 (3) MG/3ML SOLN 1 vial twice a day for 5 days Patient taking differently: 1 vial twice a day 08/30/19  Yes Garnet Sierras, DO  levocetirizine (XYZAL) 5 MG tablet Take 1 tablet (5 mg total) by mouth every evening. 08/14/19  Yes Ambs, Kathrine Cords, FNP  loratadine (CLARITIN) 10 MG tablet Take 10 mg by mouth every morning.   Yes [provider]  Lysine 1000 MG TABS Take 1 tablet by mouth as needed.  06/19/19   Yes [provider]  Methylsulfonylmethane (MSM PO) Take 1,000 mg by mouth daily.    Yes [provider]  montelukast (SINGULAIR) 10 MG tablet Take 1 tablet (10 mg total) by mouth at bedtime. 08/14/19  Yes Ambs, Kathrine Cords, FNP  Multiple Vitamin (MULTIVITAMIN ADULT PO) Take 3 tablets by mouth daily. 06/19/19  Yes [provider]  Olopatadine HCl 0.2 % SOLN One drop each eye once a day as needed for itchy eyes. 08/14/19  Yes Ambs, Kathrine Cords, FNP  omeprazole (PRILOSEC) 20 MG capsule One capsule twice a day for reflux. 09/04/19  Yes Bardelas, Jens Som, MD  OVER THE COUNTER MEDICATION Mushroon Matcha Drink mix daily   Yes [provider]  OVER THE COUNTER MEDICATION Matcha powder mix in food or water and drink once a day   Yes [provider]  OVER THE COUNTER MEDICATION Sovereign Silver immune support take 6 droppers full once a day   Yes [provider]  pseudoephedrine (SUDAFED) 30 MG tablet Take 30 mg by mouth every 4 (four) hours as needed for congestion.   Yes [provider]  Tiotropium Bromide Monohydrate (SPIRIVA RESPIMAT) 1.25 MCG/ACT AERS Inhale 2 puffs into the lungs daily. 08/14/19  Yes Ambs, Kathrine Cords, FNP  triamcinolone (NASACORT) 55 MCG/ACT AERO nasal inhaler One spray each nostril twice a day as needed 09/04/19  Yes Bardelas, Jose A, MD  promethazine (PHENERGAN) 12.5 MG tablet Take 12.5 mg by mouth every 4 (four) hours as needed.  07/04/18 07/11/18  [provider]    Family History  Problem Relation Age of Onset  . Allergic rhinitis Neg Hx   . Angioedema Neg Hx   . Asthma Neg Hx   . Eczema Neg Hx   . Immunodeficiency Neg Hx   . Urticaria Neg Hx      Social History   Tobacco Use  . Smoking status: Never Smoker  . Smokeless tobacco: Never Used  Substance Use Topics  . Alcohol use: No    Alcohol/week: 0.0 standard drinks  . Drug use: No    Allergies as of 10/23/2019 - Review Complete 10/23/2019  Allergen Reaction  Noted  . Amoxicillin Anaphylaxis 05/21/2013  . Ciprofloxacin Anaphylaxis 02/05/2018  . Molds & smuts Hives and Shortness Of Breath 07/14/2017  . Other Anaphylaxis, Hives, and Other (See Comments) 04/09/2015  . Peanut-containing drug products Anaphylaxis 05/21/2013  . Penicillins Anaphylaxis 05/21/2013  . Propoxyphene Nausea And Vomiting 06/19/2019  . Sulfa antibiotics Other (See Comments) 07/17/2018  . Wheat bran  06/27/2016  . Wool alcohol [lanolin] Anaphylaxis 08/01/2019  . Corn-containing products Other (See Comments) 06/27/2016  . Latex Hives 07/14/2017  . Prednisone Other (See Comments) 04/13/2017    Review of Systems:    All systems reviewed and negative except where noted in HPI.   Observations/Objective:  Labs: CBC    Component Value Date/Time   WBC 6.3 09/06/2019  1159   WBC 4.2 06/19/2019 1045   WBC 4.9 03/20/2013 1028   WBC 4.4 01/24/2008 1137   RBC 4.25 09/06/2019 1159   RBC 4.39 06/19/2019 1045   HGB 13.4 09/06/2019 1159   HCT 40.7 09/06/2019 1159   PLT 243 09/06/2019 1159   MCV 96 09/06/2019 1159   MCV 88 03/20/2013 1028   MCH 31.5 09/06/2019 1159   MCH 30.5 06/19/2019 1045   MCHC 32.9 09/06/2019 1159   MCHC 33.3 06/19/2019 1045   RDW 13.3 09/06/2019 1159   RDW 17.1 (H) 03/20/2013 1028   LYMPHSABS 2.7 09/06/2019 1159   MONOABS 0.2 06/19/2019 1045   EOSABS 0.1 09/06/2019 1159   BASOSABS 0.1 09/06/2019 1159   CMP     Component Value Date/Time   NA 141 06/19/2019 1045   K 3.7 06/19/2019 1045   CL 103 06/19/2019 1045   CO2 31 06/19/2019 1045   GLUCOSE 72 06/19/2019 1045   BUN 11 06/19/2019 1045   CREATININE 0.98 06/19/2019 1045   CALCIUM 9.3 06/19/2019 1045   PROT 6.6 06/19/2019 1045   ALBUMIN 4.4 06/19/2019 1045   AST 13 (L) 06/19/2019 1045   ALT 12 06/19/2019 1045   ALKPHOS 50 06/19/2019 1045   BILITOT 0.4 06/19/2019 1045   GFRNONAA >60 06/19/2019 1045   GFRAA >60 06/19/2019 1045    Imaging Studies: No results found.  Assessment and  Plan:   DASHANNA HORNBACHER is a 49 y.o. y/o female here for follow-up of chronic constipation.  The patient is not being helped with Linzess and MiraLAX.  She does report that she only takes it every other day. She has been instructed to start taking the MiraLAX and Linzess every day.  She has also been told that the [place] can be more effective with taking it with food since it has been shown to cause more diarrhea that way.  This may be helpful since she is not being helped with 30 minutes before meal.  If she does not get a response from taking it every day and then by taking with the meal she has been told to double up the 22mcg to 144 mcg and see if that works.  If it does not then she will contact me for a possible increase to 290 mcg of Linzess.  The patient has been explained the plan agrees with it.  Follow Up Instructions:  I discussed the assessment and treatment plan with the patient. The patient was provided an opportunity to ask questions and all were answered. The patient agreed with the plan and demonstrated an understanding of the instructions.   The patient was advised to call back or seek an in-person evaluation if the symptoms worsen or if the condition fails to improve as anticipated.  I provided 20 minutes of non-face-to-face time during this encounter.   Angela Lame, MD  Speech recognition software was used to dictate the above note.

## 2019-10-24 DIAGNOSIS — R0602 Shortness of breath: Secondary | ICD-10-CM | POA: Diagnosis not present

## 2019-10-24 DIAGNOSIS — J45909 Unspecified asthma, uncomplicated: Secondary | ICD-10-CM | POA: Diagnosis not present

## 2019-10-30 DIAGNOSIS — F4310 Post-traumatic stress disorder, unspecified: Secondary | ICD-10-CM | POA: Diagnosis not present

## 2019-10-30 DIAGNOSIS — Z79899 Other long term (current) drug therapy: Secondary | ICD-10-CM | POA: Diagnosis not present

## 2019-10-30 DIAGNOSIS — F330 Major depressive disorder, recurrent, mild: Secondary | ICD-10-CM | POA: Diagnosis not present

## 2019-10-30 DIAGNOSIS — E559 Vitamin D deficiency, unspecified: Secondary | ICD-10-CM | POA: Diagnosis not present

## 2019-10-30 DIAGNOSIS — R5383 Other fatigue: Secondary | ICD-10-CM | POA: Diagnosis not present

## 2019-10-30 DIAGNOSIS — Z131 Encounter for screening for diabetes mellitus: Secondary | ICD-10-CM | POA: Diagnosis not present

## 2019-10-30 DIAGNOSIS — F411 Generalized anxiety disorder: Secondary | ICD-10-CM | POA: Diagnosis not present

## 2019-10-31 ENCOUNTER — Other Ambulatory Visit: Payer: Self-pay

## 2019-10-31 ENCOUNTER — Other Ambulatory Visit: Payer: Self-pay | Admitting: Gastroenterology

## 2019-10-31 ENCOUNTER — Other Ambulatory Visit
Admission: RE | Admit: 2019-10-31 | Discharge: 2019-10-31 | Disposition: A | Discharge status: Home / Self Care | Payer: PPO | Source: Ambulatory Visit | Attending: Gastroenterology | Admitting: Gastroenterology

## 2019-10-31 DIAGNOSIS — Z01812 Encounter for preprocedural laboratory examination: Secondary | ICD-10-CM | POA: Insufficient documentation

## 2019-10-31 DIAGNOSIS — Z20822 Contact with and (suspected) exposure to covid-19: Secondary | ICD-10-CM | POA: Insufficient documentation

## 2019-10-31 MED ORDER — LINACLOTIDE 72 MCG PO CAPS: 72.0000 ug | ORAL_CAPSULE | Freq: Every day | ORAL | 6 refills | Status: DC

## 2019-10-31 NOTE — Telephone Encounter (Signed)
Prescription for Linzess 78mcg has been sent to pt's pharmacy.

## 2019-10-31 NOTE — Telephone Encounter (Signed)
Linzess 72 mg  30 day  [facility] in [place] on [address]

## 2019-11-01 ENCOUNTER — Other Ambulatory Visit: Payer: PPO

## 2019-11-01 ENCOUNTER — Other Ambulatory Visit: Payer: Self-pay | Admitting: Family Medicine

## 2019-11-01 ENCOUNTER — Other Ambulatory Visit: Payer: Self-pay

## 2019-11-01 LAB — SARS CORONAVIRUS 2 (TAT 6-24 HRS): SARS Coronavirus 2: NEGATIVE

## 2019-11-01 MED ORDER — NA SULFATE-K SULFATE-MG SULF 17.5-3.13-1.6 GM/177ML PO SOLN: 1.0000 | Freq: Once | ORAL | 0 refills | Status: AC

## 2019-11-02 DIAGNOSIS — M542 Cervicalgia: Secondary | ICD-10-CM | POA: Diagnosis not present

## 2019-11-02 DIAGNOSIS — M545 Low back pain: Secondary | ICD-10-CM | POA: Diagnosis not present

## 2019-11-03 DIAGNOSIS — M545 Low back pain: Secondary | ICD-10-CM | POA: Diagnosis not present

## 2019-11-03 DIAGNOSIS — M542 Cervicalgia: Secondary | ICD-10-CM | POA: Diagnosis not present

## 2019-11-04 ENCOUNTER — Other Ambulatory Visit: Payer: Self-pay

## 2019-11-05 ENCOUNTER — Encounter
Admission: RE | Disposition: A | Discharge status: Home / Self Care | Payer: Self-pay | Source: Ambulatory Visit | Attending: Gastroenterology

## 2019-11-05 ENCOUNTER — Ambulatory Visit: Payer: PPO | Admitting: Certified Registered"

## 2019-11-05 ENCOUNTER — Encounter: Payer: Self-pay | Admitting: Gastroenterology

## 2019-11-05 ENCOUNTER — Ambulatory Visit
Admission: RE | Admit: 2019-11-05 | Discharge: 2019-11-05 | Disposition: A | Discharge status: Home / Self Care | Payer: PPO | Source: Ambulatory Visit | Attending: Gastroenterology | Admitting: Gastroenterology

## 2019-11-05 ENCOUNTER — Other Ambulatory Visit: Payer: Self-pay

## 2019-11-05 DIAGNOSIS — K5904 Chronic idiopathic constipation: Secondary | ICD-10-CM | POA: Insufficient documentation

## 2019-11-05 DIAGNOSIS — Z881 Allergy status to other antibiotic agents status: Secondary | ICD-10-CM | POA: Insufficient documentation

## 2019-11-05 DIAGNOSIS — Z91040 Latex allergy status: Secondary | ICD-10-CM | POA: Diagnosis not present

## 2019-11-05 DIAGNOSIS — Z882 Allergy status to sulfonamides status: Secondary | ICD-10-CM | POA: Insufficient documentation

## 2019-11-05 DIAGNOSIS — Z880 Allergy status to penicillin: Secondary | ICD-10-CM | POA: Diagnosis not present

## 2019-11-05 DIAGNOSIS — J45909 Unspecified asthma, uncomplicated: Secondary | ICD-10-CM | POA: Diagnosis not present

## 2019-11-05 DIAGNOSIS — Z79899 Other long term (current) drug therapy: Secondary | ICD-10-CM | POA: Diagnosis not present

## 2019-11-05 DIAGNOSIS — Z888 Allergy status to other drugs, medicaments and biological substances status: Secondary | ICD-10-CM | POA: Diagnosis not present

## 2019-11-05 DIAGNOSIS — K640 First degree hemorrhoids: Secondary | ICD-10-CM | POA: Insufficient documentation

## 2019-11-05 DIAGNOSIS — K219 Gastro-esophageal reflux disease without esophagitis: Secondary | ICD-10-CM | POA: Diagnosis not present

## 2019-11-05 HISTORY — PX: ESOPHAGOGASTRODUODENOSCOPY (EGD) WITH PROPOFOL: SHX5813

## 2019-11-05 HISTORY — PX: COLONOSCOPY WITH PROPOFOL: SHX5780

## 2019-11-05 LAB — POCT PREGNANCY, URINE: Preg Test, Ur: NEGATIVE

## 2019-11-05 SURGERY — COLONOSCOPY WITH PROPOFOL
Anesthesia: General

## 2019-11-05 MED ORDER — GLYCOPYRROLATE 0.2 MG/ML IJ SOLN
INTRAMUSCULAR | Status: DC | PRN
  Administered 2019-11-05: .2 mg via INTRAVENOUS

## 2019-11-05 MED ORDER — ONDANSETRON HCL 4 MG/2ML IJ SOLN
INTRAMUSCULAR | Status: AC
  Administered 2019-11-05: 4 mg via INTRAVENOUS
  Filled 2019-11-05: qty 2

## 2019-11-05 MED ORDER — PROPOFOL 500 MG/50ML IV EMUL
INTRAVENOUS | Status: DC | PRN
  Administered 2019-11-05: 145 ug/kg/min via INTRAVENOUS

## 2019-11-05 MED ORDER — PROPOFOL 10 MG/ML IV BOLUS
INTRAVENOUS | Status: DC | PRN
  Administered 2019-11-05: 10 mg via INTRAVENOUS
  Administered 2019-11-05: 40 mg via INTRAVENOUS
  Administered 2019-11-05 (×2): 10 mg via INTRAVENOUS

## 2019-11-05 MED ORDER — SODIUM CHLORIDE 0.9 % IV SOLN: INTRAVENOUS | Status: DC

## 2019-11-05 MED ORDER — LIDOCAINE HCL (CARDIAC) PF 100 MG/5ML IV SOSY
PREFILLED_SYRINGE | INTRAVENOUS | Status: DC | PRN
  Administered 2019-11-05: 100 mg via INTRAVENOUS

## 2019-11-05 MED ORDER — ONDANSETRON HCL 4 MG/2ML IJ SOLN: 4.0000 mg | Freq: Once | INTRAMUSCULAR | Status: AC

## 2019-11-05 MED ORDER — MIDAZOLAM HCL 2 MG/2ML IJ SOLN
INTRAMUSCULAR | Status: DC | PRN
  Administered 2019-11-05: 2 mg via INTRAVENOUS

## 2019-11-05 MED ORDER — ONDANSETRON HCL 4 MG/2ML IJ SOLN
INTRAMUSCULAR | Status: DC | PRN
  Administered 2019-11-05: 4 mg via INTRAVENOUS

## 2019-11-05 MED ORDER — MIDAZOLAM HCL 2 MG/2ML IJ SOLN
INTRAMUSCULAR | Status: AC
  Filled 2019-11-05: qty 2

## 2019-11-05 NOTE — Op Note (Signed)
[facility] Gastroenterology Patient Name: Angela Jimenez Procedure Date: 11/05/2019 10:23 AM MRN: [id number] Account #: [id number] Date of Birth: [birth year] Admit Type: Outpatient Age: 49 Room: [facility] ENDO ROOM 4 Gender: Female Note Status: Finalized Procedure:             Upper GI endoscopy Indications:           Heartburn Providers:             Lucilla Lame MD, MD Referring MD:          Adele Barthel. Masneri (Referring MD) Medicines:             Propofol per Anesthesia Complications:         No immediate complications. Procedure:             Pre-Anesthesia Assessment:                        - Prior to the procedure, a History and Physical was                         performed, and patient medications and allergies were                         reviewed. The patient's tolerance of previous                         anesthesia was also reviewed. The risks and benefits                         of the procedure and the sedation options and risks                         were discussed with the patient. All questions were                         answered, and informed consent was obtained. Prior                         Anticoagulants: The patient has taken no previous                         anticoagulant or antiplatelet agents. ASA Grade                         Assessment: II - A patient with mild systemic disease.                         After reviewing the risks and benefits, the patient                         was deemed in satisfactory condition to undergo the                         procedure.                        After obtaining informed consent, the endoscope was  passed under direct vision. Throughout the procedure,                         the patient's blood pressure, pulse, and oxygen                         saturations were monitored continuously. The Endoscope                         was introduced through the mouth, and advanced to the                          second part of duodenum. The upper GI endoscopy was                         accomplished without difficulty. The patient tolerated                         the procedure well. Findings:      The examined esophagus was normal.      The entire examined stomach was normal.      The examined duodenum was normal. Biopsies for histology were taken with       a cold forceps for evaluation of celiac disease. Impression:            - Normal esophagus.                        - Normal stomach.                        - Normal examined duodenum. Biopsied. Recommendation:        - Discharge patient to home.                        - Resume previous diet.                        - Continue present medications.                        - Await pathology results.                        - Perform a colonoscopy today. Procedure Code(s):     --- Professional ---                        [phone], Esophagogastroduodenoscopy, flexible,                         transoral; with biopsy, single or multiple Diagnosis Code(s):     --- Professional ---                        R12, Heartburn CPT copyright 2019 American Medical Association. All rights reserved. The codes documented in this report are preliminary and upon coder review may  be revised to meet current compliance requirements. Lucilla Lame MD, MD 11/05/2019 10:39:10 AM This report has been signed electronically. Number of Addenda: 0 Note Initiated On: 11/05/2019 10:23 AM Estimated Blood Loss:  Estimated blood loss: none.  [facility]

## 2019-11-05 NOTE — Transfer of Care (Signed)
Immediate Anesthesia Transfer of Care Note  Patient: Angela Jimenez  Procedure(s) Performed: COLONOSCOPY WITH PROPOFOL (N/A ) ESOPHAGOGASTRODUODENOSCOPY (EGD) WITH PROPOFOL  Patient Location: Endoscopy Unit  Anesthesia Type:General  Level of Consciousness: drowsy, patient cooperative and responds to stimulation  Airway & Oxygen Therapy: Patient Spontanous Breathing and Patient connected to face mask oxygen  Post-op Assessment: Report given to RN and Post -op Vital signs reviewed and stable  Post vital signs: Reviewed and stable  Last Vitals:  Vitals Value Taken Time  BP 114/82 11/05/19 1058  Temp    Pulse 85 11/05/19 1059  Resp 32 11/05/19 1059  SpO2 100 % 11/05/19 1059  Vitals shown include unvalidated device data.  Last Pain:  Vitals:   11/05/19 1050  TempSrc: Temporal  PainSc:          Complications: No complications documented.

## 2019-11-05 NOTE — Anesthesia Preprocedure Evaluation (Signed)
Anesthesia Evaluation  Patient identified by MRN, date of birth, ID band Patient awake    Reviewed: Allergy & Precautions, NPO status , Patient's Chart, lab work & pertinent test results  History of Anesthesia Complications Negative for: history of anesthetic complications  Airway Mallampati: II       Dental   Pulmonary asthma , neg sleep apnea, Not current smoker,           Cardiovascular (-) hypertension(-) Past MI and (-) CHF (-) dysrhythmias (-) Valvular Problems/Murmurs     Neuro/Psych neg Seizures    GI/Hepatic Neg liver ROS, GERD  Medicated,  Endo/Other  neg diabetes  Renal/GU negative Renal ROS     Musculoskeletal   Abdominal   Peds  Hematology  (+) anemia ,   Anesthesia Other Findings   Reproductive/Obstetrics                             Anesthesia Physical Anesthesia Plan  ASA: II  Anesthesia Plan: General   Post-op Pain Management:    Induction: Intravenous  PONV Risk Score and Plan: 3 and Propofol infusion, TIVA and Treatment may vary due to age or medical condition  Airway Management Planned: Nasal Cannula  Additional Equipment:   Intra-op Plan:   Post-operative Plan:   Informed Consent: I have reviewed the patients History and Physical, chart, labs and discussed the procedure including the risks, benefits and alternatives for the proposed anesthesia with the patient or authorized representative who has indicated his/her understanding and acceptance.       Plan Discussed with:   Anesthesia Plan Comments:         Anesthesia Quick Evaluation

## 2019-11-05 NOTE — H&P (Signed)
Angela Lame, MD [facility] [address] [place], [place] [address] Phone:[phone] Fax : [phone]  Primary Care Physician:  Lyman Bishop, DO Primary Gastroenterologist:  Dr. Allen Norris  Pre-Procedure History & Physical: HPI:  Angela Jimenez is a 49 y.o. female is here for an endoscopy and colonoscopy.   Past Medical History:  Diagnosis Date  . Asthma   . Bulging lumbar disc    between c3 and c4   . Iron malabsorption 09/08/2017  . Menometrorrhagia 09/08/2017  . Trigger finger 2020    Past Surgical History:  Procedure Laterality Date  . MYOMECTOMY ABDOMINAL APPROACH      Prior to Admission medications   Medication Sig Start Date End Date Taking? Authorizing Provider  albuterol (PROAIR HFA) 108 (90 Base) MCG/ACT inhaler Inhale 2 puffs into the lungs every 4 (four) hours as needed (for coughing or wheezing spells). 09/25/19  Yes Ambs, Kathrine Cords, FNP  cetirizine (ZYRTEC) 10 MG tablet Take 10 mg by mouth every evening.   Yes [provider]  Cholecalciferol (VITAMIN D3 PO) Take 2,000 Units by mouth.    Yes [provider]  COLLAGEN PO Take 10 g by mouth daily.   Yes [provider]  cyclobenzaprine (FLEXERIL) 10 MG tablet Take 10 mg by mouth as needed for muscle spasms.   Yes [provider]  diphenhydrAMINE (BENADRYL) 25 MG tablet Take 25 mg by mouth every 4 (four) hours as needed.   Yes [provider]  linaclotide (LINZESS) 72 MCG capsule Take 1 capsule (72 mcg total) by mouth daily before breakfast. 10/31/19  Yes Angela Lame, MD  montelukast (SINGULAIR) 10 MG tablet Take 1 tablet (10 mg total) by mouth at bedtime. 08/14/19  Yes Ambs, Kathrine Cords, FNP  triamcinolone (NASACORT) 55 MCG/ACT AERO nasal inhaler One spray each nostril twice a day as needed 09/04/19  Yes Bardelas, Jose A, MD  albuterol (PROVENTIL) (2.5 MG/3ML) 0.083% nebulizer solution Take 3 mLs (2.5 mg total) by nebulization every 6 (six) hours as needed for wheezing or  shortness of breath. 09/03/19   Dara Hoyer, FNP  albuterol (VENTOLIN HFA) 108 (90 Base) MCG/ACT inhaler INHALE 2 PUFFS INTO THE LUNGS EVERY 4 HOURS AS NEEDED FOR WHEEZING OR SHORTNESS OF BREATH 09/23/19   Ambs, Kathrine Cords, FNP  azelastine (ASTELIN) 0.1 % nasal spray Two sprays each nostril twice a day as needed for a runny nose. 08/14/19   Dara Hoyer, FNP  budesonide-formoterol (SYMBICORT) 160-4.5 MCG/ACT inhaler Inhale 2 puffs into the lungs 2 (two) times daily. Patient not taking: Reported on 11/05/2019 08/14/19   Dara Hoyer, FNP  EPINEPHrine 0.3 mg/0.3 mL IJ SOAJ injection Use as directed for severe allergic reaction. 08/14/19   Dara Hoyer, FNP  HYDROcodone-acetaminophen (NORCO/VICODIN) 5-325 MG tablet Take 0.5-1 tablets by mouth 2 (two) times daily. 06/11/19   [provider]  ipratropium-albuterol (DUONEB) 0.5-2.5 (3) MG/3ML SOLN 1 vial twice a day for 5 days Patient taking differently: 1 vial twice a day 08/30/19   Garnet Sierras, DO  levocetirizine (XYZAL) 5 MG tablet Take 1 tablet (5 mg total) by mouth every evening. 08/14/19   Ambs, Kathrine Cords, FNP  loratadine (CLARITIN) 10 MG tablet Take 10 mg by mouth every morning.    [provider]  Lysine 1000 MG TABS Take 1 tablet by mouth as needed.  06/19/19   [provider]  Methylsulfonylmethane (MSM PO) Take 1,000 mg by mouth daily.     [provider]  Multiple Vitamin (MULTIVITAMIN ADULT PO) Take 3 tablets by mouth daily. 06/19/19   [provider]  Olopatadine HCl 0.2 % SOLN One drop each eye once a day as needed for itchy eyes. 08/14/19   Dara Hoyer, FNP  omeprazole (PRILOSEC) 20 MG capsule One capsule twice a day for reflux. 09/04/19   Charlies Silvers, MD  OVER THE COUNTER MEDICATION Mushroon Matcha Drink mix daily    [provider]  OVER THE COUNTER MEDICATION Matcha powder mix in food or water and drink once a day    [provider]  OVER THE COUNTER MEDICATION Sovereign Silver immune  support take 6 droppers full once a day    [provider]  promethazine (PHENERGAN) 12.5 MG tablet Take 12.5 mg by mouth every 4 (four) hours as needed.  07/04/18 07/11/18  [provider]  pseudoephedrine (SUDAFED) 30 MG tablet Take 30 mg by mouth every 4 (four) hours as needed for congestion.    [provider]  Tiotropium Bromide Monohydrate (SPIRIVA RESPIMAT) 1.25 MCG/ACT AERS Inhale 2 puffs into the lungs daily. Patient not taking: Reported on 11/05/2019 08/14/19   Dara Hoyer, FNP    Allergies as of 10/24/2019 - Review Complete 10/23/2019  Allergen Reaction Noted  . Amoxicillin Anaphylaxis 05/21/2013  . Ciprofloxacin Anaphylaxis 02/05/2018  . Molds & smuts Hives and Shortness Of Breath 07/14/2017  . Other Anaphylaxis, Hives, and Other (See Comments) 04/09/2015  . Peanut-containing drug products Anaphylaxis 05/21/2013  . Penicillins Anaphylaxis 05/21/2013  . Propoxyphene Nausea And Vomiting 06/19/2019  . Sulfa antibiotics Other (See Comments) 07/17/2018  . Wheat bran  06/27/2016  . Wool alcohol [lanolin] Anaphylaxis 08/01/2019  . Corn-containing products Other (See Comments) 06/27/2016  . Latex Hives 07/14/2017  . Prednisone Other (See Comments) 04/13/2017    Family History  Problem Relation Age of Onset  . Allergic rhinitis Neg Hx   . Angioedema Neg Hx   . Asthma Neg Hx   . Eczema Neg Hx   . Immunodeficiency Neg Hx   . Urticaria Neg Hx     Social History   Socioeconomic History  . Marital status: Divorced    Spouse name: Not on file  . Number of children: Not on file  . Years of education: Not on file  . Highest education level: Not on file  Occupational History  . Not on file  Tobacco Use  . Smoking status: Never Smoker  . Smokeless tobacco: Never Used  Vaping Use  . Vaping Use: Never used  Substance and Sexual Activity  . Alcohol use: No    Alcohol/week: 0.0 standard drinks  . Drug use: No  . Sexual activity: Not on file  Other  Topics Concern  . Not on file  Social History Narrative  . Not on file   Social Determinants of Health   Financial Resource Strain:   . Difficulty of Paying Living Expenses:   Food Insecurity:   . Worried About Charity fundraiser in the Last Year:   . Arboriculturist in the Last Year:   Transportation Needs:   . Film/video editor (Medical):   Marland Kitchen Lack of Transportation (Non-Medical):   Physical Activity:   . Days of Exercise per Week:   . Minutes of Exercise per Session:   Stress:   . Feeling of Stress :   Social Connections:   . Frequency of Communication with Friends and Family:   . Frequency of Social  Gatherings with Friends and Family:   . Attends Religious Services:   . Active Member of Clubs or Organizations:   . Attends Archivist Meetings:   Marland Kitchen Marital Status:   Intimate Partner Violence:   . Fear of Current or Ex-Partner:   . Emotionally Abused:   Marland Kitchen Physically Abused:   . Sexually Abused:     Review of Systems: See HPI, otherwise negative ROS  Physical Exam: BP 133/85   Pulse 76   Temp (!) 96.6 F (35.9 C) (Temporal)   Resp 17   Ht 5\' 2"  (1.575 m)   Wt 62.6 kg   SpO2 100%   BMI 25.24 kg/m  General:   Alert,  pleasant and cooperative in NAD Head:  Normocephalic and atraumatic. Neck:  Supple; no masses or thyromegaly. Lungs:  Clear throughout to auscultation.    Heart:  Regular rate and rhythm. Abdomen:  Soft, nontender and nondistended. Normal bowel sounds, without guarding, and without rebound.   Neurologic:  Alert and  oriented x4;  grossly normal neurologically.  Impression/Plan: Angela Jimenez is here for an endoscopy and colonoscopy to be performed for GERD and constipation  Risks, benefits, limitations, and alternatives regarding  endoscopy and colonoscopy have been reviewed with the patient.  Questions have been answered.  All parties agreeable.   Angela Lame, MD  11/05/2019, 10:20 AM

## 2019-11-05 NOTE — Op Note (Signed)
[facility] Patient Name: Angela Jimenez Procedure Date: 11/05/2019 10:22 AM MRN: [id number] Account #: [id number] Date of Birth: 10-Apr-1971 Admit Type: Outpatient Age: 49 Room: [facility] ENDO ROOM 4 Gender: Female Note Status: Finalized Procedure:             Colonoscopy Indications:           Constipation Providers:             Lucilla Lame MD, MD Referring MD:          Adele Barthel. Masneri (Referring MD) Medicines:             Propofol per Anesthesia Complications:         No immediate complications. Procedure:             Pre-Anesthesia Assessment:                        - Prior to the procedure, a History and Physical was                         performed, and patient medications and allergies were                         reviewed. The patient's tolerance of previous                         anesthesia was also reviewed. The risks and benefits                         of the procedure and the sedation options and risks                         were discussed with the patient. All questions were                         answered, and informed consent was obtained. Prior                         Anticoagulants: The patient has taken no previous                         anticoagulant or antiplatelet agents. ASA Grade                         Assessment: II - A patient with mild systemic disease.                         After reviewing the risks and benefits, the patient                         was deemed in satisfactory condition to undergo the                         procedure.                        After obtaining informed consent, the colonoscope was                         passed  under direct vision. Throughout the procedure,                         the patient's blood pressure, pulse, and oxygen                         saturations were monitored continuously. The                         Colonoscope was introduced through the anus and                          advanced to the the cecum, identified by appendiceal                         orifice and ileocecal valve. The colonoscopy was                         performed without difficulty. The patient tolerated                         the procedure well. The quality of the bowel                         preparation was excellent. Findings:      The perianal and digital rectal examinations were normal.      Non-bleeding internal hemorrhoids were found during retroflexion. The       hemorrhoids were Grade I (internal hemorrhoids that do not prolapse). Impression:            - Non-bleeding internal hemorrhoids.                        - No specimens collected. Recommendation:        - Discharge patient to home.                        - Resume previous diet.                        - Continue present medications. Procedure Code(s):     --- Professional ---                        [phone], Colonoscopy, flexible; diagnostic, including                         collection of specimen(s) by brushing or washing, when                         performed (separate procedure) Diagnosis Code(s):     --- Professional ---                        K59.00, Constipation, unspecified CPT copyright 2019 American Medical Association. All rights reserved. The codes documented in this report are preliminary and upon coder review may  be revised to meet current compliance requirements. Lucilla Lame MD, MD 11/05/2019 10:57:36 AM This report has been signed electronically. Number of Addenda: 0 Note Initiated On: 11/05/2019 10:22 AM Scope Withdrawal Time: 0 hours 8 minutes 39 seconds  Total Procedure Duration:  0 hours 14 minutes 2 seconds  Estimated Blood Loss:  Estimated blood loss: none.      [facility]

## 2019-11-05 NOTE — Anesthesia Postprocedure Evaluation (Signed)
Anesthesia Post Note  Patient: Angela Jimenez  Procedure(s) Performed: COLONOSCOPY WITH PROPOFOL (N/A ) ESOPHAGOGASTRODUODENOSCOPY (EGD) WITH PROPOFOL  Patient location during evaluation: Endoscopy Anesthesia Type: General Level of consciousness: awake and alert and oriented Pain management: pain level controlled Vital Signs Assessment: post-procedure vital signs reviewed and stable Respiratory status: spontaneous breathing Cardiovascular status: blood pressure returned to baseline Anesthetic complications: no   No complications documented.   Last Vitals:  Vitals:   11/05/19 1120 11/05/19 1130  BP: 132/85 120/89  Pulse: 86 89  Resp: (!) 27 19  Temp:    SpO2: 100% 99%    Last Pain:  Vitals:   11/05/19 1050  TempSrc: Temporal  PainSc:                  Zakir Henner

## 2019-11-06 ENCOUNTER — Encounter: Payer: Self-pay | Admitting: Gastroenterology

## 2019-11-06 ENCOUNTER — Ambulatory Visit: Payer: PPO | Admitting: Family Medicine

## 2019-11-06 DIAGNOSIS — Z79899 Other long term (current) drug therapy: Secondary | ICD-10-CM | POA: Diagnosis not present

## 2019-11-06 LAB — SURGICAL PATHOLOGY

## 2019-11-07 DIAGNOSIS — J45909 Unspecified asthma, uncomplicated: Secondary | ICD-10-CM | POA: Diagnosis not present

## 2019-11-07 DIAGNOSIS — R0602 Shortness of breath: Secondary | ICD-10-CM | POA: Diagnosis not present

## 2019-11-15 DIAGNOSIS — M79601 Pain in right arm: Secondary | ICD-10-CM | POA: Diagnosis not present

## 2019-11-15 DIAGNOSIS — M542 Cervicalgia: Secondary | ICD-10-CM | POA: Diagnosis not present

## 2019-11-29 DIAGNOSIS — M47812 Spondylosis without myelopathy or radiculopathy, cervical region: Secondary | ICD-10-CM | POA: Diagnosis not present

## 2019-12-02 DIAGNOSIS — M542 Cervicalgia: Secondary | ICD-10-CM | POA: Diagnosis not present

## 2019-12-02 DIAGNOSIS — M545 Low back pain: Secondary | ICD-10-CM | POA: Diagnosis not present

## 2019-12-03 DIAGNOSIS — M545 Low back pain: Secondary | ICD-10-CM | POA: Diagnosis not present

## 2019-12-03 DIAGNOSIS — M47812 Spondylosis without myelopathy or radiculopathy, cervical region: Secondary | ICD-10-CM | POA: Diagnosis not present

## 2019-12-03 DIAGNOSIS — M542 Cervicalgia: Secondary | ICD-10-CM | POA: Diagnosis not present

## 2019-12-04 DIAGNOSIS — F411 Generalized anxiety disorder: Secondary | ICD-10-CM | POA: Diagnosis not present

## 2019-12-04 DIAGNOSIS — F4310 Post-traumatic stress disorder, unspecified: Secondary | ICD-10-CM | POA: Diagnosis not present

## 2019-12-04 DIAGNOSIS — F988 Other specified behavioral and emotional disorders with onset usually occurring in childhood and adolescence: Secondary | ICD-10-CM | POA: Diagnosis not present

## 2019-12-04 DIAGNOSIS — F330 Major depressive disorder, recurrent, mild: Secondary | ICD-10-CM | POA: Diagnosis not present

## 2019-12-05 DIAGNOSIS — Z789 Other specified health status: Secondary | ICD-10-CM | POA: Diagnosis not present

## 2019-12-05 DIAGNOSIS — I10 Essential (primary) hypertension: Secondary | ICD-10-CM | POA: Diagnosis not present

## 2019-12-05 DIAGNOSIS — E559 Vitamin D deficiency, unspecified: Secondary | ICD-10-CM | POA: Diagnosis not present

## 2019-12-05 DIAGNOSIS — Z79899 Other long term (current) drug therapy: Secondary | ICD-10-CM | POA: Diagnosis not present

## 2019-12-19 DIAGNOSIS — M47812 Spondylosis without myelopathy or radiculopathy, cervical region: Secondary | ICD-10-CM | POA: Diagnosis not present

## 2020-01-01 DIAGNOSIS — G8929 Other chronic pain: Secondary | ICD-10-CM | POA: Diagnosis not present

## 2020-01-01 DIAGNOSIS — Z79899 Other long term (current) drug therapy: Secondary | ICD-10-CM | POA: Diagnosis not present

## 2020-01-01 DIAGNOSIS — M545 Low back pain: Secondary | ICD-10-CM | POA: Diagnosis not present

## 2020-01-01 DIAGNOSIS — F329 Major depressive disorder, single episode, unspecified: Secondary | ICD-10-CM | POA: Diagnosis not present

## 2020-01-01 DIAGNOSIS — F419 Anxiety disorder, unspecified: Secondary | ICD-10-CM | POA: Diagnosis not present

## 2020-01-02 DIAGNOSIS — F988 Other specified behavioral and emotional disorders with onset usually occurring in childhood and adolescence: Secondary | ICD-10-CM | POA: Diagnosis not present

## 2020-01-02 DIAGNOSIS — M542 Cervicalgia: Secondary | ICD-10-CM | POA: Diagnosis not present

## 2020-01-02 DIAGNOSIS — F411 Generalized anxiety disorder: Secondary | ICD-10-CM | POA: Diagnosis not present

## 2020-01-02 DIAGNOSIS — F330 Major depressive disorder, recurrent, mild: Secondary | ICD-10-CM | POA: Diagnosis not present

## 2020-01-02 DIAGNOSIS — M545 Low back pain: Secondary | ICD-10-CM | POA: Diagnosis not present

## 2020-01-02 DIAGNOSIS — F4310 Post-traumatic stress disorder, unspecified: Secondary | ICD-10-CM | POA: Diagnosis not present

## 2020-01-03 DIAGNOSIS — M545 Low back pain: Secondary | ICD-10-CM | POA: Diagnosis not present

## 2020-01-03 DIAGNOSIS — M542 Cervicalgia: Secondary | ICD-10-CM | POA: Diagnosis not present

## 2020-01-17 DIAGNOSIS — M545 Low back pain: Secondary | ICD-10-CM | POA: Diagnosis not present

## 2020-01-17 DIAGNOSIS — M542 Cervicalgia: Secondary | ICD-10-CM | POA: Diagnosis not present

## 2020-01-30 DIAGNOSIS — F1010 Alcohol abuse, uncomplicated: Secondary | ICD-10-CM | POA: Diagnosis not present

## 2020-02-02 DIAGNOSIS — M545 Low back pain: Secondary | ICD-10-CM | POA: Diagnosis not present

## 2020-02-02 DIAGNOSIS — M542 Cervicalgia: Secondary | ICD-10-CM | POA: Diagnosis not present

## 2020-02-03 DIAGNOSIS — E559 Vitamin D deficiency, unspecified: Secondary | ICD-10-CM | POA: Diagnosis not present

## 2020-02-03 DIAGNOSIS — M545 Low back pain: Secondary | ICD-10-CM | POA: Diagnosis not present

## 2020-02-03 DIAGNOSIS — Z789 Other specified health status: Secondary | ICD-10-CM | POA: Diagnosis not present

## 2020-02-03 DIAGNOSIS — Z20822 Contact with and (suspected) exposure to covid-19: Secondary | ICD-10-CM | POA: Diagnosis not present

## 2020-02-03 DIAGNOSIS — Z114 Encounter for screening for human immunodeficiency virus [HIV]: Secondary | ICD-10-CM | POA: Diagnosis not present

## 2020-02-03 DIAGNOSIS — Z1159 Encounter for screening for other viral diseases: Secondary | ICD-10-CM | POA: Diagnosis not present

## 2020-02-03 DIAGNOSIS — M542 Cervicalgia: Secondary | ICD-10-CM | POA: Diagnosis not present

## 2020-02-03 DIAGNOSIS — Z79899 Other long term (current) drug therapy: Secondary | ICD-10-CM | POA: Diagnosis not present

## 2020-02-03 DIAGNOSIS — R3129 Other microscopic hematuria: Secondary | ICD-10-CM | POA: Diagnosis not present

## 2020-02-05 DIAGNOSIS — F4310 Post-traumatic stress disorder, unspecified: Secondary | ICD-10-CM | POA: Diagnosis not present

## 2020-02-05 DIAGNOSIS — H6983 Other specified disorders of Eustachian tube, bilateral: Secondary | ICD-10-CM | POA: Diagnosis not present

## 2020-02-05 DIAGNOSIS — F988 Other specified behavioral and emotional disorders with onset usually occurring in childhood and adolescence: Secondary | ICD-10-CM | POA: Diagnosis not present

## 2020-02-05 DIAGNOSIS — J309 Allergic rhinitis, unspecified: Secondary | ICD-10-CM | POA: Diagnosis not present

## 2020-02-05 DIAGNOSIS — F330 Major depressive disorder, recurrent, mild: Secondary | ICD-10-CM | POA: Diagnosis not present

## 2020-02-05 DIAGNOSIS — F411 Generalized anxiety disorder: Secondary | ICD-10-CM | POA: Diagnosis not present

## 2020-02-06 DIAGNOSIS — H9202 Otalgia, left ear: Secondary | ICD-10-CM | POA: Diagnosis not present

## 2020-02-10 DIAGNOSIS — J309 Allergic rhinitis, unspecified: Secondary | ICD-10-CM | POA: Diagnosis not present

## 2020-02-10 DIAGNOSIS — M26601 Right temporomandibular joint disorder, unspecified: Secondary | ICD-10-CM | POA: Diagnosis not present

## 2020-02-11 DIAGNOSIS — J45909 Unspecified asthma, uncomplicated: Secondary | ICD-10-CM | POA: Diagnosis not present

## 2020-02-11 DIAGNOSIS — Z9109 Other allergy status, other than to drugs and biological substances: Secondary | ICD-10-CM | POA: Diagnosis not present

## 2020-02-11 DIAGNOSIS — R0602 Shortness of breath: Secondary | ICD-10-CM | POA: Diagnosis not present

## 2020-02-11 DIAGNOSIS — F1010 Alcohol abuse, uncomplicated: Secondary | ICD-10-CM | POA: Diagnosis not present

## 2020-02-13 DIAGNOSIS — H9203 Otalgia, bilateral: Secondary | ICD-10-CM | POA: Insufficient documentation

## 2020-02-13 DIAGNOSIS — M542 Cervicalgia: Secondary | ICD-10-CM | POA: Diagnosis not present

## 2020-02-14 DIAGNOSIS — F1010 Alcohol abuse, uncomplicated: Secondary | ICD-10-CM | POA: Diagnosis not present

## 2020-02-17 DIAGNOSIS — M542 Cervicalgia: Secondary | ICD-10-CM | POA: Diagnosis not present

## 2020-02-17 DIAGNOSIS — F1010 Alcohol abuse, uncomplicated: Secondary | ICD-10-CM | POA: Diagnosis not present

## 2020-02-17 DIAGNOSIS — M545 Low back pain: Secondary | ICD-10-CM | POA: Diagnosis not present

## 2020-02-19 DIAGNOSIS — F1010 Alcohol abuse, uncomplicated: Secondary | ICD-10-CM | POA: Diagnosis not present

## 2020-02-21 DIAGNOSIS — F1010 Alcohol abuse, uncomplicated: Secondary | ICD-10-CM | POA: Diagnosis not present

## 2020-02-22 DIAGNOSIS — J018 Other acute sinusitis: Secondary | ICD-10-CM | POA: Diagnosis not present

## 2020-02-22 DIAGNOSIS — R059 Cough, unspecified: Secondary | ICD-10-CM | POA: Diagnosis not present

## 2020-02-24 DIAGNOSIS — F1010 Alcohol abuse, uncomplicated: Secondary | ICD-10-CM | POA: Diagnosis not present

## 2020-02-26 DIAGNOSIS — F1010 Alcohol abuse, uncomplicated: Secondary | ICD-10-CM | POA: Diagnosis not present

## 2020-02-28 DIAGNOSIS — R053 Chronic cough: Secondary | ICD-10-CM | POA: Diagnosis not present

## 2020-02-28 DIAGNOSIS — J329 Chronic sinusitis, unspecified: Secondary | ICD-10-CM | POA: Diagnosis not present

## 2020-02-28 DIAGNOSIS — F1010 Alcohol abuse, uncomplicated: Secondary | ICD-10-CM | POA: Diagnosis not present

## 2020-03-02 DIAGNOSIS — F1010 Alcohol abuse, uncomplicated: Secondary | ICD-10-CM | POA: Diagnosis not present

## 2020-03-03 DIAGNOSIS — M542 Cervicalgia: Secondary | ICD-10-CM | POA: Diagnosis not present

## 2020-03-03 DIAGNOSIS — J45909 Unspecified asthma, uncomplicated: Secondary | ICD-10-CM | POA: Diagnosis not present

## 2020-03-03 DIAGNOSIS — M5459 Other low back pain: Secondary | ICD-10-CM | POA: Diagnosis not present

## 2020-03-04 DIAGNOSIS — M5459 Other low back pain: Secondary | ICD-10-CM | POA: Diagnosis not present

## 2020-03-04 DIAGNOSIS — F411 Generalized anxiety disorder: Secondary | ICD-10-CM | POA: Diagnosis not present

## 2020-03-04 DIAGNOSIS — F4310 Post-traumatic stress disorder, unspecified: Secondary | ICD-10-CM | POA: Diagnosis not present

## 2020-03-04 DIAGNOSIS — F988 Other specified behavioral and emotional disorders with onset usually occurring in childhood and adolescence: Secondary | ICD-10-CM | POA: Diagnosis not present

## 2020-03-04 DIAGNOSIS — M542 Cervicalgia: Secondary | ICD-10-CM | POA: Diagnosis not present

## 2020-03-04 DIAGNOSIS — F330 Major depressive disorder, recurrent, mild: Secondary | ICD-10-CM | POA: Diagnosis not present

## 2020-03-04 DIAGNOSIS — F1010 Alcohol abuse, uncomplicated: Secondary | ICD-10-CM | POA: Diagnosis not present

## 2020-03-05 DIAGNOSIS — M545 Low back pain, unspecified: Secondary | ICD-10-CM | POA: Diagnosis not present

## 2020-03-05 DIAGNOSIS — Z79899 Other long term (current) drug therapy: Secondary | ICD-10-CM | POA: Diagnosis not present

## 2020-03-05 DIAGNOSIS — G8929 Other chronic pain: Secondary | ICD-10-CM | POA: Diagnosis not present

## 2020-03-05 DIAGNOSIS — M542 Cervicalgia: Secondary | ICD-10-CM | POA: Diagnosis not present

## 2020-03-05 DIAGNOSIS — R8290 Unspecified abnormal findings in urine: Secondary | ICD-10-CM | POA: Diagnosis not present

## 2020-03-06 DIAGNOSIS — F1010 Alcohol abuse, uncomplicated: Secondary | ICD-10-CM | POA: Diagnosis not present

## 2020-03-09 DIAGNOSIS — F1010 Alcohol abuse, uncomplicated: Secondary | ICD-10-CM | POA: Diagnosis not present

## 2020-03-11 DIAGNOSIS — F1010 Alcohol abuse, uncomplicated: Secondary | ICD-10-CM | POA: Diagnosis not present

## 2020-03-13 DIAGNOSIS — F1010 Alcohol abuse, uncomplicated: Secondary | ICD-10-CM | POA: Diagnosis not present

## 2020-03-13 DIAGNOSIS — Z9189 Other specified personal risk factors, not elsewhere classified: Secondary | ICD-10-CM | POA: Diagnosis not present

## 2020-03-13 DIAGNOSIS — J4540 Moderate persistent asthma, uncomplicated: Secondary | ICD-10-CM | POA: Diagnosis not present

## 2020-03-16 DIAGNOSIS — F1010 Alcohol abuse, uncomplicated: Secondary | ICD-10-CM | POA: Diagnosis not present

## 2020-03-18 DIAGNOSIS — M542 Cervicalgia: Secondary | ICD-10-CM | POA: Diagnosis not present

## 2020-03-18 DIAGNOSIS — F1010 Alcohol abuse, uncomplicated: Secondary | ICD-10-CM | POA: Diagnosis not present

## 2020-03-18 DIAGNOSIS — M5459 Other low back pain: Secondary | ICD-10-CM | POA: Diagnosis not present

## 2020-03-20 DIAGNOSIS — J45909 Unspecified asthma, uncomplicated: Secondary | ICD-10-CM | POA: Diagnosis not present

## 2020-03-20 DIAGNOSIS — Z9189 Other specified personal risk factors, not elsewhere classified: Secondary | ICD-10-CM | POA: Diagnosis not present

## 2020-03-25 DIAGNOSIS — F1010 Alcohol abuse, uncomplicated: Secondary | ICD-10-CM | POA: Diagnosis not present

## 2020-04-02 DIAGNOSIS — M545 Low back pain, unspecified: Secondary | ICD-10-CM | POA: Diagnosis not present

## 2020-04-02 DIAGNOSIS — M542 Cervicalgia: Secondary | ICD-10-CM | POA: Diagnosis not present

## 2020-04-04 DIAGNOSIS — M545 Low back pain, unspecified: Secondary | ICD-10-CM | POA: Diagnosis not present

## 2020-04-04 DIAGNOSIS — M542 Cervicalgia: Secondary | ICD-10-CM | POA: Diagnosis not present

## 2020-04-09 ENCOUNTER — Other Ambulatory Visit: Payer: Self-pay | Admitting: Family Medicine

## 2020-04-09 DIAGNOSIS — Z79899 Other long term (current) drug therapy: Secondary | ICD-10-CM | POA: Diagnosis not present

## 2020-04-09 DIAGNOSIS — G8929 Other chronic pain: Secondary | ICD-10-CM | POA: Diagnosis not present

## 2020-04-09 DIAGNOSIS — M542 Cervicalgia: Secondary | ICD-10-CM | POA: Diagnosis not present

## 2020-04-09 DIAGNOSIS — Z789 Other specified health status: Secondary | ICD-10-CM | POA: Diagnosis not present

## 2020-04-09 DIAGNOSIS — M545 Low back pain, unspecified: Secondary | ICD-10-CM | POA: Diagnosis not present

## 2020-04-11 DIAGNOSIS — J45909 Unspecified asthma, uncomplicated: Secondary | ICD-10-CM | POA: Diagnosis not present

## 2020-04-11 DIAGNOSIS — Z20822 Contact with and (suspected) exposure to covid-19: Secondary | ICD-10-CM | POA: Diagnosis not present

## 2020-04-11 DIAGNOSIS — J309 Allergic rhinitis, unspecified: Secondary | ICD-10-CM | POA: Diagnosis not present

## 2020-04-11 DIAGNOSIS — Z91018 Allergy to other foods: Secondary | ICD-10-CM | POA: Diagnosis not present

## 2020-04-15 DIAGNOSIS — F411 Generalized anxiety disorder: Secondary | ICD-10-CM | POA: Diagnosis not present

## 2020-04-15 DIAGNOSIS — F330 Major depressive disorder, recurrent, mild: Secondary | ICD-10-CM | POA: Diagnosis not present

## 2020-04-15 DIAGNOSIS — F988 Other specified behavioral and emotional disorders with onset usually occurring in childhood and adolescence: Secondary | ICD-10-CM | POA: Diagnosis not present

## 2020-04-15 DIAGNOSIS — F4310 Post-traumatic stress disorder, unspecified: Secondary | ICD-10-CM | POA: Diagnosis not present

## 2020-04-15 DIAGNOSIS — Z1339 Encounter for screening examination for other mental health and behavioral disorders: Secondary | ICD-10-CM | POA: Diagnosis not present

## 2020-04-18 DIAGNOSIS — M542 Cervicalgia: Secondary | ICD-10-CM | POA: Diagnosis not present

## 2020-04-18 DIAGNOSIS — M545 Low back pain, unspecified: Secondary | ICD-10-CM | POA: Diagnosis not present

## 2020-04-21 DIAGNOSIS — Z9109 Other allergy status, other than to drugs and biological substances: Secondary | ICD-10-CM | POA: Diagnosis not present

## 2020-04-21 DIAGNOSIS — J45909 Unspecified asthma, uncomplicated: Secondary | ICD-10-CM | POA: Diagnosis not present

## 2020-04-21 DIAGNOSIS — F1010 Alcohol abuse, uncomplicated: Secondary | ICD-10-CM | POA: Diagnosis not present

## 2020-04-21 DIAGNOSIS — Z6826 Body mass index (BMI) 26.0-26.9, adult: Secondary | ICD-10-CM | POA: Diagnosis not present

## 2020-05-02 DIAGNOSIS — F419 Anxiety disorder, unspecified: Secondary | ICD-10-CM | POA: Diagnosis not present

## 2020-05-02 DIAGNOSIS — R109 Unspecified abdominal pain: Secondary | ICD-10-CM | POA: Diagnosis not present

## 2020-05-02 DIAGNOSIS — G8929 Other chronic pain: Secondary | ICD-10-CM | POA: Diagnosis not present

## 2020-05-02 DIAGNOSIS — M545 Low back pain, unspecified: Secondary | ICD-10-CM | POA: Diagnosis not present

## 2020-05-04 DIAGNOSIS — M545 Low back pain, unspecified: Secondary | ICD-10-CM | POA: Diagnosis not present

## 2020-05-04 DIAGNOSIS — M542 Cervicalgia: Secondary | ICD-10-CM | POA: Diagnosis not present

## 2020-05-06 DIAGNOSIS — F1010 Alcohol abuse, uncomplicated: Secondary | ICD-10-CM | POA: Diagnosis not present

## 2020-05-07 DIAGNOSIS — M545 Low back pain, unspecified: Secondary | ICD-10-CM | POA: Diagnosis not present

## 2020-05-07 DIAGNOSIS — G8929 Other chronic pain: Secondary | ICD-10-CM | POA: Diagnosis not present

## 2020-05-07 DIAGNOSIS — Z79899 Other long term (current) drug therapy: Secondary | ICD-10-CM | POA: Diagnosis not present

## 2020-05-07 DIAGNOSIS — E559 Vitamin D deficiency, unspecified: Secondary | ICD-10-CM | POA: Diagnosis not present

## 2020-05-07 DIAGNOSIS — M549 Dorsalgia, unspecified: Secondary | ICD-10-CM | POA: Diagnosis not present

## 2020-05-18 DIAGNOSIS — M545 Low back pain, unspecified: Secondary | ICD-10-CM | POA: Diagnosis not present

## 2020-05-18 DIAGNOSIS — M542 Cervicalgia: Secondary | ICD-10-CM | POA: Diagnosis not present

## 2020-05-19 DIAGNOSIS — M546 Pain in thoracic spine: Secondary | ICD-10-CM | POA: Diagnosis not present

## 2020-05-19 DIAGNOSIS — M542 Cervicalgia: Secondary | ICD-10-CM | POA: Diagnosis not present

## 2020-05-26 DIAGNOSIS — F1010 Alcohol abuse, uncomplicated: Secondary | ICD-10-CM | POA: Diagnosis not present

## 2020-05-27 DIAGNOSIS — M545 Low back pain, unspecified: Secondary | ICD-10-CM | POA: Diagnosis not present

## 2020-05-28 DIAGNOSIS — Z6826 Body mass index (BMI) 26.0-26.9, adult: Secondary | ICD-10-CM | POA: Diagnosis not present

## 2020-05-28 DIAGNOSIS — M545 Low back pain, unspecified: Secondary | ICD-10-CM | POA: Diagnosis not present

## 2020-05-28 DIAGNOSIS — Z9109 Other allergy status, other than to drugs and biological substances: Secondary | ICD-10-CM | POA: Diagnosis not present

## 2020-05-28 DIAGNOSIS — J45909 Unspecified asthma, uncomplicated: Secondary | ICD-10-CM | POA: Diagnosis not present

## 2020-05-29 DIAGNOSIS — M722 Plantar fascial fibromatosis: Secondary | ICD-10-CM | POA: Diagnosis not present

## 2020-05-29 DIAGNOSIS — G8929 Other chronic pain: Secondary | ICD-10-CM | POA: Diagnosis not present

## 2020-05-29 DIAGNOSIS — M545 Low back pain, unspecified: Secondary | ICD-10-CM | POA: Diagnosis not present

## 2020-06-02 DIAGNOSIS — R0602 Shortness of breath: Secondary | ICD-10-CM | POA: Diagnosis not present

## 2020-06-02 DIAGNOSIS — J45909 Unspecified asthma, uncomplicated: Secondary | ICD-10-CM | POA: Diagnosis not present

## 2020-06-03 DIAGNOSIS — M545 Low back pain, unspecified: Secondary | ICD-10-CM | POA: Diagnosis not present

## 2020-06-04 DIAGNOSIS — M545 Low back pain, unspecified: Secondary | ICD-10-CM | POA: Diagnosis not present

## 2020-06-04 DIAGNOSIS — M542 Cervicalgia: Secondary | ICD-10-CM | POA: Diagnosis not present

## 2020-06-10 DIAGNOSIS — F1010 Alcohol abuse, uncomplicated: Secondary | ICD-10-CM | POA: Diagnosis not present

## 2020-06-15 DIAGNOSIS — M47816 Spondylosis without myelopathy or radiculopathy, lumbar region: Secondary | ICD-10-CM | POA: Diagnosis not present

## 2020-06-15 DIAGNOSIS — M722 Plantar fascial fibromatosis: Secondary | ICD-10-CM | POA: Diagnosis not present

## 2020-06-18 DIAGNOSIS — M542 Cervicalgia: Secondary | ICD-10-CM | POA: Diagnosis not present

## 2020-06-18 DIAGNOSIS — M545 Low back pain, unspecified: Secondary | ICD-10-CM | POA: Diagnosis not present

## 2020-06-22 ENCOUNTER — Other Ambulatory Visit: Payer: Self-pay | Admitting: Family Medicine

## 2020-06-22 ENCOUNTER — Telehealth: Payer: Self-pay

## 2020-06-22 NOTE — Telephone Encounter (Signed)
Spoke to pt. To let her know that we sent in a courtesy refill and no more refills will be sent in until she is seen. Pt. Will look at her schedule and will call office to schedule an appointment.

## 2020-06-24 DIAGNOSIS — F988 Other specified behavioral and emotional disorders with onset usually occurring in childhood and adolescence: Secondary | ICD-10-CM | POA: Diagnosis not present

## 2020-06-24 DIAGNOSIS — F4310 Post-traumatic stress disorder, unspecified: Secondary | ICD-10-CM | POA: Diagnosis not present

## 2020-06-24 DIAGNOSIS — F411 Generalized anxiety disorder: Secondary | ICD-10-CM | POA: Diagnosis not present

## 2020-06-24 DIAGNOSIS — F330 Major depressive disorder, recurrent, mild: Secondary | ICD-10-CM | POA: Diagnosis not present

## 2020-06-25 DIAGNOSIS — M47812 Spondylosis without myelopathy or radiculopathy, cervical region: Secondary | ICD-10-CM | POA: Diagnosis not present

## 2020-06-30 DIAGNOSIS — D849 Immunodeficiency, unspecified: Secondary | ICD-10-CM | POA: Diagnosis not present

## 2020-06-30 DIAGNOSIS — L231 Allergic contact dermatitis due to adhesives: Secondary | ICD-10-CM | POA: Diagnosis not present

## 2020-06-30 DIAGNOSIS — J4530 Mild persistent asthma, uncomplicated: Secondary | ICD-10-CM | POA: Diagnosis not present

## 2020-06-30 DIAGNOSIS — Z889 Allergy status to unspecified drugs, medicaments and biological substances status: Secondary | ICD-10-CM | POA: Diagnosis not present

## 2020-06-30 DIAGNOSIS — Z91018 Allergy to other foods: Secondary | ICD-10-CM | POA: Diagnosis not present

## 2020-06-30 DIAGNOSIS — J3089 Other allergic rhinitis: Secondary | ICD-10-CM | POA: Diagnosis not present

## 2020-06-30 DIAGNOSIS — J302 Other seasonal allergic rhinitis: Secondary | ICD-10-CM | POA: Diagnosis not present

## 2020-07-01 DIAGNOSIS — F1010 Alcohol abuse, uncomplicated: Secondary | ICD-10-CM | POA: Diagnosis not present

## 2020-07-05 DIAGNOSIS — M542 Cervicalgia: Secondary | ICD-10-CM | POA: Diagnosis not present

## 2020-07-05 DIAGNOSIS — M545 Low back pain, unspecified: Secondary | ICD-10-CM | POA: Diagnosis not present

## 2020-07-07 DIAGNOSIS — J3089 Other allergic rhinitis: Secondary | ICD-10-CM | POA: Diagnosis not present

## 2020-07-07 DIAGNOSIS — Z91018 Allergy to other foods: Secondary | ICD-10-CM | POA: Diagnosis not present

## 2020-07-07 DIAGNOSIS — J302 Other seasonal allergic rhinitis: Secondary | ICD-10-CM | POA: Diagnosis not present

## 2020-07-07 DIAGNOSIS — J4540 Moderate persistent asthma, uncomplicated: Secondary | ICD-10-CM | POA: Diagnosis not present

## 2020-07-09 DIAGNOSIS — M47816 Spondylosis without myelopathy or radiculopathy, lumbar region: Secondary | ICD-10-CM | POA: Diagnosis not present

## 2020-07-19 DIAGNOSIS — M545 Low back pain, unspecified: Secondary | ICD-10-CM | POA: Diagnosis not present

## 2020-07-19 DIAGNOSIS — M542 Cervicalgia: Secondary | ICD-10-CM | POA: Diagnosis not present

## 2020-07-21 DIAGNOSIS — J302 Other seasonal allergic rhinitis: Secondary | ICD-10-CM | POA: Diagnosis not present

## 2020-07-21 DIAGNOSIS — J3089 Other allergic rhinitis: Secondary | ICD-10-CM | POA: Diagnosis not present

## 2020-07-21 DIAGNOSIS — J4540 Moderate persistent asthma, uncomplicated: Secondary | ICD-10-CM | POA: Diagnosis not present

## 2020-07-21 DIAGNOSIS — R1310 Dysphagia, unspecified: Secondary | ICD-10-CM | POA: Diagnosis not present

## 2020-07-22 DIAGNOSIS — F1010 Alcohol abuse, uncomplicated: Secondary | ICD-10-CM | POA: Diagnosis not present

## 2020-07-27 DIAGNOSIS — M722 Plantar fascial fibromatosis: Secondary | ICD-10-CM | POA: Diagnosis not present

## 2020-07-29 DIAGNOSIS — M5136 Other intervertebral disc degeneration, lumbar region: Secondary | ICD-10-CM | POA: Insufficient documentation

## 2020-08-02 DIAGNOSIS — M542 Cervicalgia: Secondary | ICD-10-CM | POA: Diagnosis not present

## 2020-08-02 DIAGNOSIS — M545 Low back pain, unspecified: Secondary | ICD-10-CM | POA: Diagnosis not present

## 2020-08-12 DIAGNOSIS — F1010 Alcohol abuse, uncomplicated: Secondary | ICD-10-CM | POA: Diagnosis not present

## 2020-08-13 DIAGNOSIS — L2489 Irritant contact dermatitis due to other agents: Secondary | ICD-10-CM | POA: Diagnosis not present

## 2020-08-14 DIAGNOSIS — Z1231 Encounter for screening mammogram for malignant neoplasm of breast: Secondary | ICD-10-CM | POA: Diagnosis not present

## 2020-08-14 DIAGNOSIS — Z789 Other specified health status: Secondary | ICD-10-CM | POA: Diagnosis not present

## 2020-08-14 DIAGNOSIS — Z79899 Other long term (current) drug therapy: Secondary | ICD-10-CM | POA: Diagnosis not present

## 2020-08-14 DIAGNOSIS — R221 Localized swelling, mass and lump, neck: Secondary | ICD-10-CM | POA: Diagnosis not present

## 2020-08-14 DIAGNOSIS — E559 Vitamin D deficiency, unspecified: Secondary | ICD-10-CM | POA: Diagnosis not present

## 2020-08-16 DIAGNOSIS — M542 Cervicalgia: Secondary | ICD-10-CM | POA: Diagnosis not present

## 2020-08-16 DIAGNOSIS — M545 Low back pain, unspecified: Secondary | ICD-10-CM | POA: Diagnosis not present

## 2020-08-25 DIAGNOSIS — Z77120 Contact with and (suspected) exposure to mold (toxic): Secondary | ICD-10-CM | POA: Diagnosis not present

## 2020-08-25 DIAGNOSIS — R5382 Chronic fatigue, unspecified: Secondary | ICD-10-CM | POA: Diagnosis not present

## 2020-08-25 DIAGNOSIS — F419 Anxiety disorder, unspecified: Secondary | ICD-10-CM | POA: Diagnosis not present

## 2020-08-28 DIAGNOSIS — M79671 Pain in right foot: Secondary | ICD-10-CM | POA: Diagnosis not present

## 2020-08-28 DIAGNOSIS — M79672 Pain in left foot: Secondary | ICD-10-CM | POA: Diagnosis not present

## 2020-08-31 ENCOUNTER — Other Ambulatory Visit: Payer: Self-pay | Admitting: Adult Medicine

## 2020-08-31 DIAGNOSIS — Z1231 Encounter for screening mammogram for malignant neoplasm of breast: Secondary | ICD-10-CM

## 2020-09-01 DIAGNOSIS — R0602 Shortness of breath: Secondary | ICD-10-CM | POA: Diagnosis not present

## 2020-09-01 DIAGNOSIS — J4540 Moderate persistent asthma, uncomplicated: Secondary | ICD-10-CM | POA: Diagnosis not present

## 2020-09-01 DIAGNOSIS — R5382 Chronic fatigue, unspecified: Secondary | ICD-10-CM | POA: Diagnosis not present

## 2020-09-01 DIAGNOSIS — K219 Gastro-esophageal reflux disease without esophagitis: Secondary | ICD-10-CM | POA: Diagnosis not present

## 2020-09-01 DIAGNOSIS — Z79899 Other long term (current) drug therapy: Secondary | ICD-10-CM | POA: Diagnosis not present

## 2020-09-02 DIAGNOSIS — F1010 Alcohol abuse, uncomplicated: Secondary | ICD-10-CM | POA: Diagnosis not present

## 2020-09-02 DIAGNOSIS — M545 Low back pain, unspecified: Secondary | ICD-10-CM | POA: Diagnosis not present

## 2020-09-02 DIAGNOSIS — M542 Cervicalgia: Secondary | ICD-10-CM | POA: Diagnosis not present

## 2020-09-09 DIAGNOSIS — Z77120 Contact with and (suspected) exposure to mold (toxic): Secondary | ICD-10-CM | POA: Diagnosis not present

## 2020-09-09 DIAGNOSIS — R5382 Chronic fatigue, unspecified: Secondary | ICD-10-CM | POA: Diagnosis not present

## 2020-09-10 ENCOUNTER — Telehealth: Payer: Self-pay | Admitting: Gastroenterology

## 2020-09-10 NOTE — Telephone Encounter (Signed)
Protonix, patient states Prilosec 20 mg doesn't work.  [facility] [place] on [address]  90 day refill

## 2020-09-14 ENCOUNTER — Telehealth: Payer: Self-pay

## 2020-09-14 NOTE — Telephone Encounter (Signed)
I have messaged this patient informing her the Protonix request has been denied as Dr Allen Norris never prescribed Prilosec nor has he seen her for reflux issues.  Her last appt was 10/23/19 and that was to discuss constipation. I have advised her to call and schedule an appt if she needs Pantoprazole. FYI

## 2020-09-14 NOTE — Telephone Encounter (Signed)
Pt called in to r/s her no show appts and stated she had labs drawn 08/25/20 with [facility] and her ferritin level was 11.2.  Do we need to see her sooner and or do a lab only.  Please advise if needed    Trang Bouse

## 2020-09-15 ENCOUNTER — Other Ambulatory Visit: Payer: Self-pay | Admitting: Family

## 2020-09-15 ENCOUNTER — Other Ambulatory Visit: Payer: Self-pay

## 2020-09-15 ENCOUNTER — Telehealth: Payer: Self-pay

## 2020-09-15 MED ORDER — PANTOPRAZOLE SODIUM 40 MG PO TBEC: 40.0000 mg | DELAYED_RELEASE_TABLET | Freq: Every day | ORAL | 0 refills | Status: DC

## 2020-09-15 NOTE — Telephone Encounter (Signed)
S/w pt per sch message and she is aware of her iron tx appt   Angela Jimenez

## 2020-09-16 DIAGNOSIS — M545 Low back pain, unspecified: Secondary | ICD-10-CM | POA: Diagnosis not present

## 2020-09-16 DIAGNOSIS — M542 Cervicalgia: Secondary | ICD-10-CM | POA: Diagnosis not present

## 2020-09-17 DIAGNOSIS — G8929 Other chronic pain: Secondary | ICD-10-CM | POA: Diagnosis not present

## 2020-09-17 DIAGNOSIS — E0590 Thyrotoxicosis, unspecified without thyrotoxic crisis or storm: Secondary | ICD-10-CM | POA: Diagnosis not present

## 2020-09-17 DIAGNOSIS — M545 Low back pain, unspecified: Secondary | ICD-10-CM | POA: Diagnosis not present

## 2020-09-17 DIAGNOSIS — F1010 Alcohol abuse, uncomplicated: Secondary | ICD-10-CM | POA: Diagnosis not present

## 2020-09-17 DIAGNOSIS — M542 Cervicalgia: Secondary | ICD-10-CM | POA: Diagnosis not present

## 2020-09-18 DIAGNOSIS — M722 Plantar fascial fibromatosis: Secondary | ICD-10-CM | POA: Diagnosis not present

## 2020-09-21 ENCOUNTER — Other Ambulatory Visit: Payer: Self-pay

## 2020-09-21 ENCOUNTER — Inpatient Hospital Stay: Payer: PPO | Attending: Hematology & Oncology

## 2020-09-21 VITALS — BP 144/75 | HR 84 | Temp 98.2°F | Resp 17

## 2020-09-21 DIAGNOSIS — K909 Intestinal malabsorption, unspecified: Secondary | ICD-10-CM | POA: Insufficient documentation

## 2020-09-21 DIAGNOSIS — E611 Iron deficiency: Secondary | ICD-10-CM

## 2020-09-21 DIAGNOSIS — N921 Excessive and frequent menstruation with irregular cycle: Secondary | ICD-10-CM

## 2020-09-21 DIAGNOSIS — D500 Iron deficiency anemia secondary to blood loss (chronic): Secondary | ICD-10-CM | POA: Diagnosis not present

## 2020-09-21 DIAGNOSIS — N920 Excessive and frequent menstruation with regular cycle: Secondary | ICD-10-CM | POA: Insufficient documentation

## 2020-09-21 MED ORDER — SODIUM CHLORIDE 0.9 % IV SOLN
Freq: Once | INTRAVENOUS | Status: AC
  Filled 2020-09-21: qty 250

## 2020-09-21 MED ORDER — SODIUM CHLORIDE 0.9 % IV SOLN
510.0000 mg | Freq: Once | INTRAVENOUS | Status: AC
  Administered 2020-09-21: 510 mg via INTRAVENOUS
  Filled 2020-09-21: qty 17

## 2020-09-21 NOTE — Patient Instructions (Signed)
Ferumoxytol injection What is this medicine? FERUMOXYTOL is an iron complex. Iron is used to make healthy red blood cells, which carry oxygen and nutrients throughout the body. This medicine is used to treat iron deficiency anemia. This medicine may be used for other purposes; ask your health care provider or pharmacist if you have questions. COMMON BRAND NAME(S): Feraheme What should I tell my health care provider before I take this medicine? They need to know if you have any of these conditions:  anemia not caused by low iron levels  high levels of iron in the blood  magnetic resonance imaging (MRI) test scheduled  an unusual or allergic reaction to iron, other medicines, foods, dyes, or preservatives  pregnant or trying to get pregnant  breast-feeding How should I use this medicine? This medicine is for injection into a vein. It is given by a health care professional in a hospital or clinic setting. Talk to your pediatrician regarding the use of this medicine in children. Special care may be needed. Overdosage: If you think you have taken too much of this medicine contact a poison control center or emergency room at once. NOTE: This medicine is only for you. Do not share this medicine with others. What if I miss a dose? It is important not to miss your dose. Call your doctor or health care professional if you are unable to keep an appointment. What may interact with this medicine? This medicine may interact with the following medications:  other iron products This list may not describe all possible interactions. Give your health care provider a list of all the medicines, herbs, non-prescription drugs, or dietary supplements you use. Also tell them if you smoke, drink alcohol, or use illegal drugs. Some items may interact with your medicine. What should I watch for while using this medicine? Visit your doctor or healthcare professional regularly. Tell your doctor or healthcare  professional if your symptoms do not start to get better or if they get worse. You may need blood work done while you are taking this medicine. You may need to follow a special diet. Talk to your doctor. Foods that contain iron include: whole grains/cereals, dried fruits, beans, or peas, leafy green vegetables, and organ meats (liver, kidney). What side effects may I notice from receiving this medicine? Side effects that you should report to your doctor or health care professional as soon as possible:  allergic reactions like skin rash, itching or hives, swelling of the face, lips, or tongue  breathing problems  changes in blood pressure  feeling faint or lightheaded, falls  fever or chills  flushing, sweating, or hot feelings  swelling of the ankles or feet Side effects that usually do not require medical attention (report to your doctor or health care professional if they continue or are bothersome):  diarrhea  headache  nausea, vomiting  stomach pain This list may not describe all possible side effects. Call your doctor for medical advice about side effects. You may report side effects to FDA at 1-800-FDA-1088. Where should I keep my medicine? This drug is given in a hospital or clinic and will not be stored at home. NOTE: This sheet is a summary. It may not cover all possible information. If you have questions about this medicine, talk to your doctor, pharmacist, or health care provider.  2021 Elsevier/Gold Standard (2016-06-27 20:21:10)  

## 2020-09-28 ENCOUNTER — Telehealth: Payer: Self-pay

## 2020-09-28 ENCOUNTER — Other Ambulatory Visit: Payer: Self-pay | Admitting: *Deleted

## 2020-09-28 DIAGNOSIS — E611 Iron deficiency: Secondary | ICD-10-CM

## 2020-09-28 DIAGNOSIS — K909 Intestinal malabsorption, unspecified: Secondary | ICD-10-CM

## 2020-09-28 DIAGNOSIS — D500 Iron deficiency anemia secondary to blood loss (chronic): Secondary | ICD-10-CM

## 2020-09-28 NOTE — Telephone Encounter (Signed)
Pt called and req to r/s her 5/11 lab to 5/10 incase she needs iron as she is not feeling any better, done  Zana Biancardi

## 2020-09-29 ENCOUNTER — Other Ambulatory Visit: Payer: Self-pay

## 2020-09-29 ENCOUNTER — Inpatient Hospital Stay: Payer: PPO

## 2020-09-29 DIAGNOSIS — K909 Intestinal malabsorption, unspecified: Secondary | ICD-10-CM

## 2020-09-29 DIAGNOSIS — D500 Iron deficiency anemia secondary to blood loss (chronic): Secondary | ICD-10-CM | POA: Diagnosis not present

## 2020-09-29 DIAGNOSIS — E611 Iron deficiency: Secondary | ICD-10-CM

## 2020-09-29 LAB — CBC WITH DIFFERENTIAL (CANCER CENTER ONLY)
Abs Immature Granulocytes: 0.02 10*3/uL (ref 0.00–0.07)
Basophils Absolute: 0 10*3/uL (ref 0.0–0.1)
Basophils Relative: 1 %
Eosinophils Absolute: 0.1 10*3/uL (ref 0.0–0.5)
Eosinophils Relative: 1 %
HCT: 42.3 % (ref 36.0–46.0)
Hemoglobin: 13.9 g/dL (ref 12.0–15.0)
Immature Granulocytes: 0 %
Lymphocytes Relative: 41 %
Lymphs Abs: 2.4 10*3/uL (ref 0.7–4.0)
MCH: 30.9 pg (ref 26.0–34.0)
MCHC: 32.9 g/dL (ref 30.0–36.0)
MCV: 94 fL (ref 80.0–100.0)
Monocytes Absolute: 0.3 10*3/uL (ref 0.1–1.0)
Monocytes Relative: 5 %
Neutro Abs: 3 10*3/uL (ref 1.7–7.7)
Neutrophils Relative %: 52 %
Platelet Count: 237 10*3/uL (ref 150–400)
RBC: 4.5 MIL/uL (ref 3.87–5.11)
RDW: 13.2 % (ref 11.5–15.5)
WBC Count: 5.8 10*3/uL (ref 4.0–10.5)
nRBC: 0 % (ref 0.0–0.2)

## 2020-09-29 LAB — CMP (CANCER CENTER ONLY)
ALT: 17 U/L (ref 0–44)
AST: 15 U/L (ref 15–41)
Albumin: 4.1 g/dL (ref 3.5–5.0)
Alkaline Phosphatase: 47 U/L (ref 38–126)
Anion gap: 6 (ref 5–15)
BUN: 10 mg/dL (ref 6–20)
CO2: 32 mmol/L (ref 22–32)
Calcium: 9.4 mg/dL (ref 8.9–10.3)
Chloride: 100 mmol/L (ref 98–111)
Creatinine: 1.08 mg/dL — ABNORMAL HIGH (ref 0.44–1.00)
GFR, Estimated: >60 mL/min (ref >60)
Glucose, Bld: 92 mg/dL (ref 70–99)
Potassium: 3.7 mmol/L (ref 3.5–5.1)
Sodium: 138 mmol/L (ref 135–145)
Total Bilirubin: 0.3 mg/dL (ref 0.3–1.2)
Total Protein: 6.3 g/dL — ABNORMAL LOW (ref 6.5–8.1)

## 2020-09-30 ENCOUNTER — Inpatient Hospital Stay (HOSPITAL_BASED_OUTPATIENT_CLINIC_OR_DEPARTMENT_OTHER): Payer: PPO | Admitting: Family

## 2020-09-30 ENCOUNTER — Encounter: Payer: Self-pay | Admitting: Family

## 2020-09-30 ENCOUNTER — Inpatient Hospital Stay: Payer: PPO

## 2020-09-30 DIAGNOSIS — K909 Intestinal malabsorption, unspecified: Secondary | ICD-10-CM

## 2020-09-30 DIAGNOSIS — E611 Iron deficiency: Secondary | ICD-10-CM | POA: Diagnosis not present

## 2020-09-30 DIAGNOSIS — D500 Iron deficiency anemia secondary to blood loss (chronic): Secondary | ICD-10-CM | POA: Diagnosis not present

## 2020-09-30 LAB — IRON AND TIBC
Iron: 183 ug/dL — ABNORMAL HIGH (ref 41–142)
Saturation Ratios: 54 % (ref 21–57)
TIBC: 339 ug/dL (ref 236–444)
UIBC: 156 ug/dL (ref 120–384)

## 2020-09-30 LAB — FERRITIN: Ferritin: 564 ng/mL — ABNORMAL HIGH (ref 11–307)

## 2020-09-30 NOTE — Progress Notes (Signed)
Hematology and Oncology Follow Up Visit  Angela Jimenez [id number] Jul 24, 1970 50 y.o. 09/30/2020   Principle Diagnosis:  Iron deficiency anemia secondary to malabsorptionand heavy cycles  Current Therapy:        Iv iron as indicated   Interim History:  [name] requested a phone visit today. She was identified using 2 patient markers. She has a migraine today and is not feeling well. She states that she has 2 toxic molds in home. This has caused a significant amount of stress.  She has a skin rash, cough and congestion and is tapering off of steroids at this time.  She had a little bleeding after having drawn with her allergist. No other blood loss noted. No bruising or petechiae.  She received IV iron on 09/21/2020.  She is still feeling fatigued.  No fever, chills, n/v, SOB, dizziness, chest pain, palpitations, abdominal pain or changes in bowel or bladder habits.  She has issues with constipation takes linzess. She has gas pain and feels she may be having gall bladder issues.  Numbness and tingling in feet comes and goes. Sees podiatrist and has been diagnosed with plantar fascitis. She has gotten steroid injections in feet but does not feel this helped.  No swelling in her extremities at this time.  She has maintained a good appetite and is staying well hydrated. Her weight described as stable.   ECOG Performance Status: 1 - Symptomatic but completely ambulatory  Medications:  Allergies as of 09/30/2020      Reactions   Amoxicillin Anaphylaxis   Ciprofloxacin Anaphylaxis      Molds & Smuts Hives, Shortness Of Breath   Other Anaphylaxis, Hives, Other (See Comments)   Tree nuts, anaphylaxis and migraine headache   Peanut-containing Drug Products Anaphylaxis   Penicillins Anaphylaxis   Propoxyphene Nausea And Vomiting   Sulfa Antibiotics Other (See Comments)   ASTHMA ATTACK   Wheat Bran    Wool Alcohol [lanolin] Anaphylaxis   Migraines, asthma, GI upset    Corn-containing Products Other (See Comments)   Bloated and gassy   Latex Hives   Prednisone Other (See Comments)   "Makes me numb and tingly" increases eye pressure.       Medication List       Accurate as of Sep 30, 2020  9:04 AM. If you have any questions, ask your nurse or doctor.        albuterol (2.5 MG/3ML) 0.083% nebulizer solution Commonly known as: PROVENTIL Take 3 mLs (2.5 mg total) by nebulization every 6 (six) hours as needed for wheezing or shortness of breath.   albuterol 108 (90 Base) MCG/ACT inhaler Commonly known as: VENTOLIN HFA INHALE 2 PUFFS INTO THE LUNGS EVERY 4 HOURS AS NEEDED FOR WHEEZING OR SHORTNESS OF BREATH   albuterol 108 (90 Base) MCG/ACT inhaler Commonly known as: ProAir HFA Inhale 2 puffs into the lungs every 4 (four) hours as needed (for coughing or wheezing spells).   albuterol 108 (90 Base) MCG/ACT inhaler Commonly known as: VENTOLIN HFA INHALE 2 PUFFS INTO THE LUNGS EVERY 4 HOURS AS NEEDED FOR WHEEZING OR SHORTNESS OF BREATH   azelastine 0.1 % nasal spray Commonly known as: ASTELIN Two sprays each nostril twice a day as needed for a runny nose.   budesonide-formoterol 160-4.5 MCG/ACT inhaler Commonly known as: Symbicort Inhale 2 puffs into the lungs 2 (two) times daily.   cetirizine 10 MG tablet Commonly known as: ZYRTEC Take 10 mg by mouth every evening.   COLLAGEN PO  Take 10 g by mouth daily.   cyclobenzaprine 10 MG tablet Commonly known as: FLEXERIL Take 10 mg by mouth as needed for muscle spasms.   diphenhydrAMINE 25 MG tablet Commonly known as: BENADRYL Take 25 mg by mouth every 4 (four) hours as needed.   EPINEPHrine 0.3 mg/0.3 mL Soaj injection Commonly known as: EPI-PEN Use as directed for severe allergic reaction.   HYDROcodone-acetaminophen 5-325 MG tablet Commonly known as: NORCO/VICODIN Take 0.5-1 tablets by mouth 2 (two) times daily.   ipratropium-albuterol 0.5-2.5 (3) MG/3ML Soln Commonly known as:  DUONEB 1 vial twice a day for 5 days What changed: additional instructions   levocetirizine 5 MG tablet Commonly known as: XYZAL Take 1 tablet (5 mg total) by mouth every evening.   linaclotide 72 MCG capsule Commonly known as: LINZESS Take 1 capsule (72 mcg total) by mouth daily before breakfast.   loratadine 10 MG tablet Commonly known as: CLARITIN Take 10 mg by mouth every morning.   Lysine 1000 MG Tabs Take 1 tablet by mouth as needed.   montelukast 10 MG tablet Commonly known as: SINGULAIR Take 1 tablet (10 mg total) by mouth at bedtime.   MSM PO Take 1,000 mg by mouth daily.   MULTIVITAMIN ADULT PO Take 3 tablets by mouth daily.   Olopatadine HCl 0.2 % Soln One drop each eye once a day as needed for itchy eyes.   omeprazole 20 MG capsule Commonly known as: PRILOSEC One capsule twice a day for reflux.   OVER THE COUNTER MEDICATION Mushroon Matcha Drink mix daily   OVER THE COUNTER MEDICATION Matcha powder mix in food or water and drink once a day   OVER THE COUNTER MEDICATION Sovereign Silver immune support take 6 droppers full once a day   pantoprazole 40 MG tablet Commonly known as: PROTONIX Take 1 tablet (40 mg total) by mouth daily.   promethazine 12.5 MG tablet Commonly known as: PHENERGAN Take 12.5 mg by mouth every 4 (four) hours as needed.   Spiriva Respimat 1.25 MCG/ACT Aers Generic drug: Tiotropium Bromide Monohydrate Inhale 2 puffs into the lungs daily.   Sudafed 30 MG tablet Generic drug: pseudoephedrine Take 30 mg by mouth every 4 (four) hours as needed for congestion.   triamcinolone 55 MCG/ACT Aero nasal inhaler Commonly known as: NASACORT One spray each nostril twice a day as needed   VITAMIN D3 PO Take 2,000 Units by mouth.       Allergies:  Allergies  Allergen Reactions  . Amoxicillin Anaphylaxis  . Ciprofloxacin Anaphylaxis       . Molds & Smuts Hives and Shortness Of Breath  . Other Anaphylaxis, Hives and Other  (See Comments)    Tree nuts, anaphylaxis and migraine headache  . Peanut-Containing Drug Products Anaphylaxis  . Penicillins Anaphylaxis  . Propoxyphene Nausea And Vomiting  . Sulfa Antibiotics Other (See Comments)    ASTHMA ATTACK  . Wheat Bran   . Wool Alcohol [Lanolin] Anaphylaxis    Migraines, asthma, GI upset  . Corn-Containing Products Other (See Comments)    Bloated and gassy  . Latex Hives  . Prednisone Other (See Comments)    "Makes me numb and tingly" increases eye pressure.     Past Medical History, Surgical history, Social history, and Family History were reviewed and updated.  Review of Systems: All other 10 point review of systems is negative.   Physical Exam:  vitals were not taken for this visit.   Wt Readings from Last 3 Encounters:  11/05/19 138 lb (62.6 kg)  09/04/19 139 lb 6.4 oz (63.2 kg)  08/14/19 138 lb 6.4 oz (62.8 kg)    Ocular: Sclerae unicteric, pupils equal, round and reactive to light Ear-nose-throat: Oropharynx clear, dentition fair Lymphatic: No cervical or supraclavicular adenopathy Lungs no rales or rhonchi, good excursion bilaterally Heart regular rate and rhythm, no murmur appreciated Abd soft, nontender, positive bowel sounds MSK no focal spinal tenderness, no joint edema Neuro: non-focal, well-oriented, appropriate affect Breasts: Deferred   Lab Results  Component Value Date   WBC 5.8 09/29/2020   HGB 13.9 09/29/2020   HCT 42.3 09/29/2020   MCV 94.0 09/29/2020   PLT 237 09/29/2020   Lab Results  Component Value Date   FERRITIN 40 06/19/2019   IRON 137 06/19/2019   TIBC 323 06/19/2019   UIBC 185 06/19/2019   IRONPCTSAT 43 06/19/2019   Lab Results  Component Value Date   RETICCTPCT 0.8 11/15/2018   RBC 4.50 09/29/2020   No results found for: KPAFRELGTCHN, LAMBDASER, KAPLAMBRATIO No results found for: IGGSERUM, IGA, IGMSERUM No results found for: Ronnald Ramp, A1GS, Nelida Meuse, SPEI    Chemistry      Component Value Date/Time   NA 138 09/29/2020 1323   K 3.7 09/29/2020 1323   CL 100 09/29/2020 1323   CO2 32 09/29/2020 1323   BUN 10 09/29/2020 1323   CREATININE 1.08 (H) 09/29/2020 1323      Component Value Date/Time   CALCIUM 9.4 09/29/2020 1323   ALKPHOS 47 09/29/2020 1323   AST 15 09/29/2020 1323   ALT 17 09/29/2020 1323   BILITOT 0.3 09/29/2020 1323       Impression and Plan: Ms. Lasker is a very pleasant 50 yo African American female with iron deficiency anemia secondary to cycles and malabsorption. Iron studies are pending. We will replace if needed.  Follow-up in 3 months.  She can contact our office with any questions or concerns.   Laverna Peace, NP 5/11/20229:04 AM

## 2020-10-01 ENCOUNTER — Telehealth: Payer: Self-pay | Admitting: *Deleted

## 2020-10-01 NOTE — Telephone Encounter (Signed)
Per 10/01/20 los - called and gave upcoming appointments - view mychart

## 2020-10-02 ENCOUNTER — Telehealth: Payer: Self-pay | Admitting: *Deleted

## 2020-10-02 DIAGNOSIS — M545 Low back pain, unspecified: Secondary | ICD-10-CM | POA: Diagnosis not present

## 2020-10-02 DIAGNOSIS — M542 Cervicalgia: Secondary | ICD-10-CM | POA: Diagnosis not present

## 2020-10-02 NOTE — Telephone Encounter (Signed)
Patient called asking for an iron infusion.  Reviewed iron studies with her after reviewing with Dr Marin Olp.  No need for iron infusion at this point.  Patient understands

## 2020-10-05 ENCOUNTER — Telehealth: Payer: Self-pay

## 2020-10-05 NOTE — Telephone Encounter (Signed)
Pt LM requesting a CB regarding concerns with her lab results.  Spoke with Judson Roch who states that only a couple labs were slightly elevated but nothing worrisome. Pt's Ferratin was increased but could be due to inflammation and pt also had a migraine the day labs were drawn. Albumin can be increased by eating more chicken. Creatinine was slightly increased due to dehydration.  Advised pt of above and pt stated understanding and declined any further concerns or questions.

## 2020-10-07 ENCOUNTER — Ambulatory Visit (INDEPENDENT_AMBULATORY_CARE_PROVIDER_SITE_OTHER): Payer: PPO | Admitting: Gastroenterology

## 2020-10-07 ENCOUNTER — Encounter: Payer: Self-pay | Admitting: Gastroenterology

## 2020-10-07 ENCOUNTER — Other Ambulatory Visit: Payer: Self-pay

## 2020-10-07 VITALS — BP 142/84 | HR 98 | Temp 97.9°F | Ht 61.0 in | Wt 160.6 lb

## 2020-10-07 DIAGNOSIS — K219 Gastro-esophageal reflux disease without esophagitis: Secondary | ICD-10-CM

## 2020-10-07 MED ORDER — OMEPRAZOLE 20 MG PO CPDR: 20.0000 mg | DELAYED_RELEASE_CAPSULE | Freq: Two times a day (BID) | ORAL | 6 refills | Status: DC

## 2020-10-07 NOTE — Progress Notes (Signed)
Primary Care Physician: Lyman Bishop, DO  Primary Gastroenterologist:  Dr. Lucilla Lame  Chief Complaint  Patient presents with  . Gastroesophageal Reflux    HPI: Angela Jimenez is a 50 y.o. female here for follow-up of GERD.  The patient has a history of food intolerance and has to avoid multiple different foods that bother her.  The patient also has had chronic constipation with a colonoscopy and upper endoscopy done back in 2021 without any cause for her symptoms being seen.  The patient also had duodenal biopsies that were normal.  The patient had recently contacted her primary care provider for a iron infusion and was informed that she was not in need of an iron infusion.  The patient does have a history of heavy periods with possible decreased iron absorption.  The patient was protonix and then went back to the Omeprazole.  The patient did not see much improved with the pantoprazole and because it had corn in its ingredients she stopped taking it and went back to the omeprazole.  The patient has not considered having antireflux surgery up until now.  She also had read something elevated and reports some neck pain that she thinks may be related to a possible gallbladder issue causing many of her symptoms.  Past Medical History:  Diagnosis Date  . Asthma   . Bulging lumbar disc    between c3 and c4   . Iron malabsorption 09/08/2017  . Menometrorrhagia 09/08/2017  . Trigger finger 2020    Current Outpatient Medications  Medication Sig Dispense Refill  . albuterol (PROAIR HFA) 108 (90 Base) MCG/ACT inhaler Inhale 2 puffs into the lungs every 4 (four) hours as needed (for coughing or wheezing spells). 18 g 1  . albuterol (PROVENTIL) (2.5 MG/3ML) 0.083% nebulizer solution Take 3 mLs (2.5 mg total) by nebulization every 6 (six) hours as needed for wheezing or shortness of breath. 75 mL 1  . albuterol (VENTOLIN HFA) 108 (90 Base) MCG/ACT inhaler INHALE 2 PUFFS INTO THE LUNGS EVERY 4  HOURS AS NEEDED FOR WHEEZING OR SHORTNESS OF BREATH 18 g 1  . albuterol (VENTOLIN HFA) 108 (90 Base) MCG/ACT inhaler INHALE 2 PUFFS INTO THE LUNGS EVERY 4 HOURS AS NEEDED FOR WHEEZING OR SHORTNESS OF BREATH 8.5 g 0  . azelastine (ASTELIN) 0.1 % nasal spray Two sprays each nostril twice a day as needed for a runny nose. 30 mL 5  . cetirizine (ZYRTEC) 10 MG tablet Take 10 mg by mouth every evening.    . Cholecalciferol (VITAMIN D3 PO) Take 2,000 Units by mouth.     . COLLAGEN PO Take 10 g by mouth daily.    . cyclobenzaprine (FLEXERIL) 10 MG tablet Take 10 mg by mouth as needed for muscle spasms.    . diphenhydrAMINE (BENADRYL) 25 MG tablet Take 25 mg by mouth every 4 (four) hours as needed.    Marland Kitchen EPINEPHrine 0.3 mg/0.3 mL IJ SOAJ injection Use as directed for severe allergic reaction. 2 each 3  . esomeprazole (NEXIUM) 20 MG capsule 1 capsule    . fluticasone (FLONASE) 50 MCG/ACT nasal spray Place into both nostrils.    . Fluticasone-Umeclidin-Vilant (TRELEGY ELLIPTA) 200-62.5-25 MCG/INH AEPB INHALE 1 PUFF BY MOUTH DAILY    . guanFACINE (TENEX) 1 MG tablet Take by mouth.    Marland Kitchen HYDROcodone-acetaminophen (NORCO/VICODIN) 5-325 MG tablet Take 0.5-1 tablets by mouth 2 (two) times daily.    . hydrOXYzine (ATARAX/VISTARIL) 50 MG tablet Take 50 mg by  mouth 2 (two) times daily as needed.    Marland Kitchen ipratropium-albuterol (DUONEB) 0.5-2.5 (3) MG/3ML SOLN 1 vial twice a day for 5 days (Patient taking differently: 1 vial twice a day) 30 mL 0  . levocetirizine (XYZAL) 5 MG tablet Take 1 tablet (5 mg total) by mouth every evening. 30 tablet 5  . linaclotide (LINZESS) 72 MCG capsule Take 1 capsule (72 mcg total) by mouth daily before breakfast. 30 capsule 6  . loratadine (CLARITIN) 10 MG tablet Take 10 mg by mouth every morning.    Marland Kitchen Lysine 1000 MG TABS Take 1 tablet by mouth as needed.     . Methylsulfonylmethane (MSM PO) Take 1,000 mg by mouth daily.     . montelukast (SINGULAIR) 10 MG tablet Take 1 tablet (10 mg  total) by mouth at bedtime. 30 tablet 5  . Multiple Vitamin (MULTIVITAMIN ADULT PO) Take 3 tablets by mouth daily.    . Olopatadine HCl 0.2 % SOLN One drop each eye once a day as needed for itchy eyes. 2.5 mL 5  . OVER THE COUNTER MEDICATION Mushroon Matcha Drink mix daily    . OVER THE COUNTER MEDICATION Matcha powder mix in food or water and drink once a day    . OVER THE COUNTER MEDICATION Sovereign Silver immune support take 6 droppers full once a day    . pantoprazole (PROTONIX) 40 MG tablet Take 1 tablet (40 mg total) by mouth daily. 30 tablet 0  . Peppermint Oil (IBGARD) 90 MG CPCR 1 tablet    . polyethylene glycol powder (GLYCOLAX/MIRALAX) 17 GM/SCOOP powder 2-3 capfuls in 24 oz water/juice, take daily for 3 days or until stools are regular and slightly loose.    . pseudoephedrine (SUDAFED) 30 MG tablet Take 30 mg by mouth every 4 (four) hours as needed for congestion.    . Tiotropium Bromide Monohydrate (SPIRIVA RESPIMAT) 1.25 MCG/ACT AERS Inhale 2 puffs into the lungs daily. 4 g 5  . traMADol (ULTRAM) 50 MG tablet every 8 hours as needed for severe pain,  may cause constipation.    . triamcinolone (NASACORT) 55 MCG/ACT AERO nasal inhaler One spray each nostril twice a day as needed 16.5 g 5  . zafirlukast (ACCOLATE) 20 MG tablet Take 20 mg by mouth 2 (two) times daily.    . budesonide-formoterol (SYMBICORT) 160-4.5 MCG/ACT inhaler Inhale 2 puffs into the lungs 2 (two) times daily. (Patient not taking: Reported on 10/07/2020) 1 Inhaler 5  . omeprazole (PRILOSEC) 20 MG capsule Take 1 capsule (20 mg total) by mouth 2 (two) times daily. 60 capsule 6  . promethazine (PHENERGAN) 12.5 MG tablet Take 12.5 mg by mouth every 4 (four) hours as needed.      No current facility-administered medications for this visit.    Allergies as of 10/07/2020 - Review Complete 11/05/2019  Allergen Reaction Noted  . Amoxicillin Anaphylaxis 05/21/2013  . Ciprofloxacin Anaphylaxis 02/05/2018  . Molds & smuts  Hives and Shortness Of Breath 07/14/2017  . Other Anaphylaxis, Hives, and Other (See Comments) 04/09/2015  . Peanut-containing drug products Anaphylaxis 05/21/2013  . Penicillins Anaphylaxis 05/21/2013  . Propoxyphene Nausea And Vomiting 06/19/2019  . Sulfa antibiotics Other (See Comments) 07/17/2018  . Wheat bran  06/27/2016  . Wool alcohol [lanolin] Anaphylaxis 08/01/2019  . Corn-containing products Other (See Comments) 06/27/2016  . Latex Hives 07/14/2017  . Prednisone Other (See Comments) 04/13/2017    ROS:  General: Negative for anorexia, weight loss, fever, chills, fatigue, weakness. ENT: Negative for hoarseness,  difficulty swallowing , nasal congestion. CV: Negative for chest pain, angina, palpitations, dyspnea on exertion, peripheral edema.  Respiratory: Negative for dyspnea at rest, dyspnea on exertion, cough, sputum, wheezing.  GI: See history of present illness. GU:  Negative for dysuria, hematuria, urinary incontinence, urinary frequency, nocturnal urination.  Endo: Negative for unusual weight change.    Physical Examination:   BP (!) 142/84   Pulse 98   Temp 97.9 F (36.6 C) (Temporal)   Ht 5\' 1"  (1.549 m)   Wt 160 lb 9.6 oz (72.8 kg)   BMI 30.35 kg/m   General: Well-nourished, well-developed in no acute distress.  Eyes: No icterus. Conjunctivae pink. Lungs: Clear to auscultation bilaterally. Non-labored. Heart: Regular rate and rhythm, no murmurs rubs or gallops.  Abdomen: Bowel sounds are normal, nontender, nondistended, no hepatosplenomegaly or masses, no abdominal bruits or hernia , no rebound or guarding.   Extremities: No lower extremity edema. No clubbing or deformities. Neuro: Alert and oriented x 3.  Grossly intact. Skin: Warm and dry, no jaundice.   Psych: Alert and cooperative, normal mood and affect.  Labs:    Imaging Studies: No results found.  Assessment and Plan:   Angela Jimenez is a 50 y.o. y/o female who comes in today with a history  of GERD and constipation.  The patient has been doing well on Linzess.  The patient needs a refill of her omeprazole.  The patient's intolerance to corn has been an issue with the coating of many PPIs.  The patient has been told that she can sprinkle the omeprazole on applesauce to avoid eating the covering.  She has also been told that she should consider antireflux surgery.  The patient will be set up for a right upper quadrant ultrasound with a gallbladder emptying study to rule out any gallbladder disease as the cause of her symptoms.  If her gallbladder is the issue then she will be referred to her surgeon and she has been told that if the gallbladder is not an issue then she should consider antireflux surgery.  The patient has an appoint with Dr. Marius Ditch on Friday for hemorrhoidal banding.  The patient will follow up with her at that time.  The patient has been explained the plan and agrees with it.     Lucilla Lame, MD. Marval Regal    Note: This dictation was prepared with Dragon dictation along with smaller phrase technology. Any transcriptional errors that result from this process are unintentional.

## 2020-10-09 ENCOUNTER — Other Ambulatory Visit: Payer: Self-pay

## 2020-10-09 ENCOUNTER — Encounter: Payer: Self-pay | Admitting: Gastroenterology

## 2020-10-09 ENCOUNTER — Ambulatory Visit (INDEPENDENT_AMBULATORY_CARE_PROVIDER_SITE_OTHER): Payer: PPO | Admitting: Gastroenterology

## 2020-10-09 VITALS — BP 126/79 | HR 99 | Temp 97.0°F | Ht 61.0 in | Wt 160.6 lb

## 2020-10-09 DIAGNOSIS — M5430 Sciatica, unspecified side: Secondary | ICD-10-CM | POA: Insufficient documentation

## 2020-10-09 DIAGNOSIS — K640 First degree hemorrhoids: Secondary | ICD-10-CM

## 2020-10-09 DIAGNOSIS — F439 Reaction to severe stress, unspecified: Secondary | ICD-10-CM | POA: Insufficient documentation

## 2020-10-09 DIAGNOSIS — F419 Anxiety disorder, unspecified: Secondary | ICD-10-CM | POA: Insufficient documentation

## 2020-10-09 DIAGNOSIS — E559 Vitamin D deficiency, unspecified: Secondary | ICD-10-CM | POA: Insufficient documentation

## 2020-10-09 DIAGNOSIS — K5904 Chronic idiopathic constipation: Secondary | ICD-10-CM | POA: Diagnosis not present

## 2020-10-09 DIAGNOSIS — F333 Major depressive disorder, recurrent, severe with psychotic symptoms: Secondary | ICD-10-CM | POA: Insufficient documentation

## 2020-10-09 NOTE — Progress Notes (Signed)
PROCEDURE NOTE: The patient presents with symptomatic grade 1 hemorrhoids, unresponsive to maximal medical therapy, requesting rubber band ligation of his/her hemorrhoidal disease.  All risks, benefits and alternative forms of therapy were described and informed consent was obtained.  In the Left Lateral Decubitus position (if anoscopy is performed) anoscopic examination revealed grade 1 hemorrhoids in the all position(s).   The decision was made to band the RA internal hemorrhoid, and the [facility] System was used to perform band ligation without complication.  Digital anorectal examination was then performed to assure proper positioning of the band, and to adjust the banded tissue as required.  The patient was discharged home without pain or other issues.  Dietary and behavioral recommendations were given and (if necessary - prescriptions were given), along with follow-up instructions.  The patient will return 2 weeks for follow-up and possible additional banding as required.  No complications were encountered and the patient tolerated the procedure well.   

## 2020-10-09 NOTE — Progress Notes (Signed)
Angela Darby, MD [address]  [place]  [place], [place] [address]  Main: [phone]  Fax: [phone] Pager: [phone]   Primary Care Physician: Lyman Bishop, DO  Primary Gastroenterologist:  Dr. Lucilla Lame  Chief Complaint  Patient presents with  . Hemorrhoids    Banding #1    HPI: Angela Jimenez is a 50 y.o. female is referred by Dr. Verl Blalock to discuss about hemorrhoid ligation for chronic hemorrhoidal symptoms, primarily rectal pressure and swelling associated with itching that has been ongoing for several years.  Her symptoms are intermittent and partially relieved with hemorrhoidal remedies.  She is trying to find a permanent solution for her recurrent symptoms.  Patient underwent colonoscopy in 10/2019, no polyps were identified, except hemorrhoids.  Her symptoms are aggravated after prolonged sitting at her job, with a recent flareup recently  Patient takes [place] about once a week, she reports occasional episodes of constipation only.  She denies any significant straining.  She does not take it more often because she is worried about diarrhea she reports that her bowel movements daily formed most of the day  Current Outpatient Medications  Medication Sig Dispense Refill  . albuterol (PROAIR HFA) 108 (90 Base) MCG/ACT inhaler Inhale 2 puffs into the lungs every 4 (four) hours as needed (for coughing or wheezing spells). 18 g 1  . albuterol (PROVENTIL) (2.5 MG/3ML) 0.083% nebulizer solution Take 3 mLs (2.5 mg total) by nebulization every 6 (six) hours as needed for wheezing or shortness of breath. 75 mL 1  . albuterol (VENTOLIN HFA) 108 (90 Base) MCG/ACT inhaler INHALE 2 PUFFS INTO THE LUNGS EVERY 4 HOURS AS NEEDED FOR WHEEZING OR SHORTNESS OF BREATH 8.5 g 0  . azelastine (ASTELIN) 0.1 % nasal spray Two sprays each nostril twice a day as needed for a runny nose. 30 mL 5  . cetirizine (ZYRTEC) 10 MG tablet Take 10 mg by mouth every evening.    .  Cholecalciferol (VITAMIN D3 PO) Take 2,000 Units by mouth.     . cyclobenzaprine (FLEXERIL) 10 MG tablet Take 10 mg by mouth as needed for muscle spasms.    . diphenhydrAMINE (BENADRYL) 25 MG tablet Take 25 mg by mouth every 4 (four) hours as needed.    Marland Kitchen EPINEPHrine 0.3 mg/0.3 mL IJ SOAJ injection Use as directed for severe allergic reaction. 2 each 3  . Fluticasone-Umeclidin-Vilant (TRELEGY ELLIPTA) 200-62.5-25 MCG/INH AEPB INHALE 1 PUFF BY MOUTH DAILY    . HYDROcodone-acetaminophen (NORCO/VICODIN) 5-325 MG tablet Take 0.5-1 tablets by mouth 2 (two) times daily.    Marland Kitchen ipratropium-albuterol (DUONEB) 0.5-2.5 (3) MG/3ML SOLN 1 vial twice a day for 5 days (Patient taking differently: 1 vial twice a day) 30 mL 0  . linaclotide (LINZESS) 72 MCG capsule Take 1 capsule (72 mcg total) by mouth daily before breakfast. 30 capsule 6  . loratadine (CLARITIN) 10 MG tablet Take 10 mg by mouth every morning.    Marland Kitchen omeprazole (PRILOSEC) 20 MG capsule Take 1 capsule (20 mg total) by mouth 2 (two) times daily. 60 capsule 6  . OVER THE COUNTER MEDICATION Mushroon Matcha Drink mix daily    . OVER THE COUNTER MEDICATION Matcha powder mix in food or water and drink once a day    . OVER THE COUNTER MEDICATION Sovereign Silver immune support take 6 droppers full once a day    . polyethylene glycol powder (GLYCOLAX/MIRALAX) 17 GM/SCOOP powder 2-3 capfuls in 24 oz water/juice, take daily  for 3 days or until stools are regular and slightly loose.    . pseudoephedrine (SUDAFED) 30 MG tablet Take 30 mg by mouth every 4 (four) hours as needed for congestion.    . budesonide-formoterol (SYMBICORT) 160-4.5 MCG/ACT inhaler Inhale 2 puffs into the lungs 2 (two) times daily. (Patient not taking: No sig reported) 1 Inhaler 5  . COLLAGEN PO Take 10 g by mouth daily. (Patient not taking: Reported on 10/09/2020)    . esomeprazole (NEXIUM) 20 MG capsule 1 capsule (Patient not taking: Reported on 10/09/2020)    . fluticasone (FLONASE) 50  MCG/ACT nasal spray Place into both nostrils. (Patient not taking: Reported on 10/09/2020)    . guanFACINE (TENEX) 1 MG tablet Take by mouth. (Patient not taking: Reported on 10/09/2020)    . hydrOXYzine (ATARAX/VISTARIL) 50 MG tablet Take 50 mg by mouth 2 (two) times daily as needed. (Patient not taking: Reported on 10/09/2020)    . levocetirizine (XYZAL) 5 MG tablet Take 1 tablet (5 mg total) by mouth every evening. (Patient not taking: Reported on 10/09/2020) 30 tablet 5  . Lysine 1000 MG TABS Take 1 tablet by mouth as needed.  (Patient not taking: Reported on 10/09/2020)    . Methylsulfonylmethane (MSM PO) Take 1,000 mg by mouth daily.  (Patient not taking: Reported on 10/09/2020)    . montelukast (SINGULAIR) 10 MG tablet Take 1 tablet (10 mg total) by mouth at bedtime. (Patient not taking: Reported on 10/09/2020) 30 tablet 5  . Multiple Vitamin (MULTIVITAMIN ADULT PO) Take 3 tablets by mouth daily. (Patient not taking: Reported on 10/09/2020)    . Olopatadine HCl 0.2 % SOLN One drop each eye once a day as needed for itchy eyes. (Patient not taking: Reported on 10/09/2020) 2.5 mL 5  . pantoprazole (PROTONIX) 40 MG tablet Take 1 tablet (40 mg total) by mouth daily. (Patient not taking: Reported on 10/09/2020) 30 tablet 0  . Peppermint Oil (IBGARD) 90 MG CPCR 1 tablet (Patient not taking: Reported on 10/09/2020)    . promethazine (PHENERGAN) 12.5 MG tablet Take 12.5 mg by mouth every 4 (four) hours as needed.     . Tiotropium Bromide Monohydrate (SPIRIVA RESPIMAT) 1.25 MCG/ACT AERS Inhale 2 puffs into the lungs daily. (Patient not taking: Reported on 10/09/2020) 4 g 5  . traMADol (ULTRAM) 50 MG tablet every 8 hours as needed for severe pain,  may cause constipation. (Patient not taking: Reported on 10/09/2020)    . triamcinolone (NASACORT) 55 MCG/ACT AERO nasal inhaler One spray each nostril twice a day as needed (Patient not taking: Reported on 10/09/2020) 16.5 g 5  . zafirlukast (ACCOLATE) 20 MG tablet Take 20  mg by mouth 2 (two) times daily. (Patient not taking: Reported on 10/09/2020)     No current facility-administered medications for this visit.    Allergies as of 10/09/2020 - Review Complete 10/09/2020  Allergen Reaction Noted  . Amoxicillin Anaphylaxis 05/21/2013  . Ciprofloxacin Anaphylaxis 02/05/2018  . Molds & smuts Hives and Shortness Of Breath 07/14/2017  . Other Anaphylaxis, Hives, and Other (See Comments) 04/09/2015  . Peanut-containing drug products Anaphylaxis 05/21/2013  . Penicillins Anaphylaxis 05/21/2013  . Propoxyphene Nausea And Vomiting 06/19/2019  . Sulfa antibiotics Other (See Comments) 07/17/2018  . Wheat bran  06/27/2016  . Wool alcohol [lanolin] Anaphylaxis 08/01/2019  . Corn-containing products Other (See Comments) 06/27/2016  . Latex Hives 07/14/2017  . Prednisone Other (See Comments) 04/13/2017    NSAIDs: None  Antiplts/Anticoagulants/Anti thrombotics: None  GI procedures:  EGD and colonoscopy 11/05/2019  DIAGNOSIS:  A. DUODENUM; COLD BIOPSY:  - ENTERIC MUCOSA WITH PRESERVED VILLOUS ARCHITECTURE AND NO SIGNIFICANT  HISTOPATHOLOGIC CHANGE.  - NEGATIVE FOR FEATURES OF CELIAC, DYSPLASIA, AND MALIGNANCY.   ROS:  General: Negative for anorexia, weight loss, fever, chills, fatigue, weakness. ENT: Negative for hoarseness, difficulty swallowing , nasal congestion. CV: Negative for chest pain, angina, palpitations, dyspnea on exertion, peripheral edema.  Respiratory: Negative for dyspnea at rest, dyspnea on exertion, cough, sputum, wheezing.  GI: See history of present illness. GU:  Negative for dysuria, hematuria, urinary incontinence, urinary frequency, nocturnal urination.  Endo: Negative for unusual weight change.    Physical Examination:   BP 126/79   Pulse 99   Temp (!) 97 F (36.1 C) (Temporal)   Ht 5\' 1"  (1.549 m)   Wt 160 lb 9.6 oz (72.8 kg)   BMI 30.35 kg/m   General: Well-nourished, well-developed in no acute distress.  Eyes: No icterus.  Conjunctivae pink. Mouth: Oropharyngeal mucosa moist and pink , no lesions erythema or exudate. Lungs: Clear to auscultation bilaterally. Non-labored. Heart: Regular rate and rhythm, no murmurs rubs or gallops.  Abdomen: Bowel sounds are normal, nontender, nondistended, no hepatosplenomegaly or masses, no hernia , no rebound or guarding.   Rectum: Normal perianal skin, skin tags, nontender digital rectal exam anoscopy revealed enlarged external hemorrhoids Extremities: No lower extremity edema. No clubbing or deformities. Neuro: Alert and oriented x 3.  Grossly intact. Skin: Warm and dry, no jaundice.   Psych: Alert and cooperative, normal mood and affect.   Imaging Studies: No results found.  Assessment and Plan:   Angela Jimenez is a 50 y.o. female with history of chronic GERD, chronic constipation maintained on Linzess 72 MCG daily is seen in consultation for symptomatic grade 1 external hemorrhoids despite medical therapies  Discussed with patient regarding hemorrhoid ligation including risks and benefits Patient is willing to undergo procedure today, consent obtained  Follow up in 2 weeks   Dr Sherri Sear, MD

## 2020-10-12 DIAGNOSIS — R3915 Urgency of urination: Secondary | ICD-10-CM | POA: Diagnosis not present

## 2020-10-12 DIAGNOSIS — M545 Low back pain, unspecified: Secondary | ICD-10-CM | POA: Diagnosis not present

## 2020-10-12 DIAGNOSIS — G8929 Other chronic pain: Secondary | ICD-10-CM | POA: Diagnosis not present

## 2020-10-12 DIAGNOSIS — M542 Cervicalgia: Secondary | ICD-10-CM | POA: Diagnosis not present

## 2020-10-12 DIAGNOSIS — E0590 Thyrotoxicosis, unspecified without thyrotoxic crisis or storm: Secondary | ICD-10-CM | POA: Diagnosis not present

## 2020-10-16 DIAGNOSIS — M542 Cervicalgia: Secondary | ICD-10-CM | POA: Diagnosis not present

## 2020-10-16 DIAGNOSIS — M545 Low back pain, unspecified: Secondary | ICD-10-CM | POA: Diagnosis not present

## 2020-10-21 DIAGNOSIS — Z8659 Personal history of other mental and behavioral disorders: Secondary | ICD-10-CM | POA: Diagnosis not present

## 2020-10-21 DIAGNOSIS — Z8639 Personal history of other endocrine, nutritional and metabolic disease: Secondary | ICD-10-CM | POA: Diagnosis not present

## 2020-10-21 DIAGNOSIS — N951 Menopausal and female climacteric states: Secondary | ICD-10-CM | POA: Diagnosis not present

## 2020-10-21 DIAGNOSIS — R7989 Other specified abnormal findings of blood chemistry: Secondary | ICD-10-CM | POA: Diagnosis not present

## 2020-10-21 DIAGNOSIS — E0590 Thyrotoxicosis, unspecified without thyrotoxic crisis or storm: Secondary | ICD-10-CM | POA: Diagnosis not present

## 2020-10-21 DIAGNOSIS — Z6829 Body mass index (BMI) 29.0-29.9, adult: Secondary | ICD-10-CM | POA: Diagnosis not present

## 2020-10-21 DIAGNOSIS — R9389 Abnormal findings on diagnostic imaging of other specified body structures: Secondary | ICD-10-CM | POA: Diagnosis not present

## 2020-10-22 DIAGNOSIS — R3915 Urgency of urination: Secondary | ICD-10-CM | POA: Diagnosis not present

## 2020-10-22 DIAGNOSIS — N941 Unspecified dyspareunia: Secondary | ICD-10-CM | POA: Diagnosis not present

## 2020-10-22 DIAGNOSIS — R102 Pelvic and perineal pain: Secondary | ICD-10-CM | POA: Diagnosis not present

## 2020-10-29 ENCOUNTER — Ambulatory Visit
Admission: RE | Admit: 2020-10-29 | Discharge: 2020-10-29 | Disposition: A | Discharge status: Home / Self Care | Payer: PPO | Source: Ambulatory Visit | Attending: Gastroenterology | Admitting: Gastroenterology

## 2020-10-29 DIAGNOSIS — F1010 Alcohol abuse, uncomplicated: Secondary | ICD-10-CM | POA: Diagnosis not present

## 2020-10-29 DIAGNOSIS — K219 Gastro-esophageal reflux disease without esophagitis: Secondary | ICD-10-CM

## 2020-10-29 DIAGNOSIS — R110 Nausea: Secondary | ICD-10-CM | POA: Diagnosis not present

## 2020-11-02 ENCOUNTER — Telehealth: Payer: Self-pay

## 2020-11-02 DIAGNOSIS — F1010 Alcohol abuse, uncomplicated: Secondary | ICD-10-CM | POA: Diagnosis not present

## 2020-11-02 DIAGNOSIS — R102 Pelvic and perineal pain: Secondary | ICD-10-CM | POA: Diagnosis not present

## 2020-11-02 DIAGNOSIS — M542 Cervicalgia: Secondary | ICD-10-CM | POA: Diagnosis not present

## 2020-11-02 DIAGNOSIS — D251 Intramural leiomyoma of uterus: Secondary | ICD-10-CM | POA: Diagnosis not present

## 2020-11-02 DIAGNOSIS — N9419 Other specified dyspareunia: Secondary | ICD-10-CM | POA: Diagnosis not present

## 2020-11-02 DIAGNOSIS — M545 Low back pain, unspecified: Secondary | ICD-10-CM | POA: Diagnosis not present

## 2020-11-02 DIAGNOSIS — Z1231 Encounter for screening mammogram for malignant neoplasm of breast: Secondary | ICD-10-CM | POA: Diagnosis not present

## 2020-11-02 NOTE — Telephone Encounter (Signed)
The fluid filled cyst are benign and of no concern and ned no further workup.

## 2020-11-02 NOTE — Telephone Encounter (Signed)
-----   Message from Lucilla Lame, MD sent at 10/31/2020  9:19 AM EDT ----- Let the patient know that the ultrasound did not show any cause for her symptoms. ----- Message ----- From: Interface, Rad Results In Sent: 10/30/2020   5:08 PM EDT To: Lucilla Lame, MD

## 2020-11-02 NOTE — Telephone Encounter (Signed)
Pt notified of US results through MyChart.  

## 2020-11-03 DIAGNOSIS — F1010 Alcohol abuse, uncomplicated: Secondary | ICD-10-CM | POA: Diagnosis not present

## 2020-11-04 ENCOUNTER — Other Ambulatory Visit: Payer: Self-pay

## 2020-11-04 ENCOUNTER — Encounter: Payer: Self-pay | Admitting: Gastroenterology

## 2020-11-04 ENCOUNTER — Ambulatory Visit (INDEPENDENT_AMBULATORY_CARE_PROVIDER_SITE_OTHER): Payer: PPO | Admitting: Gastroenterology

## 2020-11-04 VITALS — BP 131/86 | HR 101 | Temp 98.5°F | Ht 61.0 in | Wt 160.0 lb

## 2020-11-04 DIAGNOSIS — K640 First degree hemorrhoids: Secondary | ICD-10-CM | POA: Diagnosis not present

## 2020-11-04 DIAGNOSIS — Z8659 Personal history of other mental and behavioral disorders: Secondary | ICD-10-CM | POA: Insufficient documentation

## 2020-11-04 DIAGNOSIS — N951 Menopausal and female climacteric states: Secondary | ICD-10-CM | POA: Insufficient documentation

## 2020-11-04 DIAGNOSIS — R9389 Abnormal findings on diagnostic imaging of other specified body structures: Secondary | ICD-10-CM | POA: Insufficient documentation

## 2020-11-04 DIAGNOSIS — Z8639 Personal history of other endocrine, nutritional and metabolic disease: Secondary | ICD-10-CM | POA: Insufficient documentation

## 2020-11-04 DIAGNOSIS — E0590 Thyrotoxicosis, unspecified without thyrotoxic crisis or storm: Secondary | ICD-10-CM | POA: Insufficient documentation

## 2020-11-04 DIAGNOSIS — I10 Essential (primary) hypertension: Secondary | ICD-10-CM | POA: Insufficient documentation

## 2020-11-04 NOTE — Progress Notes (Signed)

## 2020-11-05 ENCOUNTER — Telehealth: Payer: Self-pay | Admitting: Gastroenterology

## 2020-11-05 DIAGNOSIS — Z780 Asymptomatic menopausal state: Secondary | ICD-10-CM | POA: Diagnosis not present

## 2020-11-05 DIAGNOSIS — Z77120 Contact with and (suspected) exposure to mold (toxic): Secondary | ICD-10-CM | POA: Diagnosis not present

## 2020-11-05 DIAGNOSIS — T781XXA Other adverse food reactions, not elsewhere classified, initial encounter: Secondary | ICD-10-CM | POA: Diagnosis not present

## 2020-11-05 DIAGNOSIS — R5382 Chronic fatigue, unspecified: Secondary | ICD-10-CM | POA: Diagnosis not present

## 2020-11-05 DIAGNOSIS — R195 Other fecal abnormalities: Secondary | ICD-10-CM | POA: Diagnosis not present

## 2020-11-05 NOTE — Telephone Encounter (Signed)
Please call patient to discuss compound Pharmacy  has questions about banding

## 2020-11-05 NOTE — Telephone Encounter (Signed)
Patient called back and states she was with people when she was talking about the banding and states with the banding she has had a lot of rectal pain and pressure. She states she does feel like something is pinching. She states the last one did not bother her through. She states that she took a muscle relaxer last night and hydrocodone  and still had some pain. She states she does have a lot of muscle rectal pain so she does not know if that is flared up. She states she is still moving slow this morning but might be some better.

## 2020-11-05 NOTE — Telephone Encounter (Signed)
Patient states with the banding she has had some rectal pressure but it has improved this morning and feel like it is getting better.  Dr. Allen Norris informed her if she found a pharmacy that would make the omeprazole compounded he could call her in a prescription for that. She states [facility] has a compounded medication for this and she would like a prescription called in for her.

## 2020-11-06 NOTE — Telephone Encounter (Signed)
Most likely, it will continue to get better over the weekend.  Please check on her on Monday  RV

## 2020-11-09 NOTE — Telephone Encounter (Signed)
Talk to [place] with [facility] and gave her the prescription over the phone. Informed patient that medication was called in to the pharmacy

## 2020-11-09 NOTE — Telephone Encounter (Signed)
Patient states she is feeling better and having no symptoms now. She states yesterday when she had a bowel movement she had a little bit of blood on the toilet paper.  Dr. Allen Norris informed her if she found a pharmacy that would make the omeprazole compounded he could call her in a prescription for that. She states [facility] has a compounded medication for this and she would like a prescription called in for her.

## 2020-11-09 NOTE — Telephone Encounter (Signed)
Compounded omeprazole 20 mg twice daily before meals 90-day supply with 1 refill for chronic GERD  RV

## 2020-11-10 ENCOUNTER — Other Ambulatory Visit: Payer: Self-pay

## 2020-11-10 ENCOUNTER — Ambulatory Visit (INDEPENDENT_AMBULATORY_CARE_PROVIDER_SITE_OTHER): Payer: PPO | Admitting: Family Medicine

## 2020-11-10 ENCOUNTER — Telehealth: Payer: Self-pay | Admitting: *Deleted

## 2020-11-10 ENCOUNTER — Encounter: Payer: Self-pay | Admitting: Family Medicine

## 2020-11-10 VITALS — BP 128/78 | HR 94 | Temp 98.0°F | Resp 18 | Ht 62.0 in | Wt 159.2 lb

## 2020-11-10 DIAGNOSIS — H1013 Acute atopic conjunctivitis, bilateral: Secondary | ICD-10-CM

## 2020-11-10 DIAGNOSIS — K9049 Malabsorption due to intolerance, not elsewhere classified: Secondary | ICD-10-CM | POA: Diagnosis not present

## 2020-11-10 DIAGNOSIS — J3089 Other allergic rhinitis: Secondary | ICD-10-CM

## 2020-11-10 DIAGNOSIS — J302 Other seasonal allergic rhinitis: Secondary | ICD-10-CM | POA: Diagnosis not present

## 2020-11-10 DIAGNOSIS — K219 Gastro-esophageal reflux disease without esophagitis: Secondary | ICD-10-CM | POA: Diagnosis not present

## 2020-11-10 DIAGNOSIS — H1010 Acute atopic conjunctivitis, unspecified eye: Secondary | ICD-10-CM

## 2020-11-10 DIAGNOSIS — J4540 Moderate persistent asthma, uncomplicated: Secondary | ICD-10-CM

## 2020-11-10 DIAGNOSIS — T7800XD Anaphylactic reaction due to unspecified food, subsequent encounter: Secondary | ICD-10-CM

## 2020-11-10 DIAGNOSIS — J4530 Mild persistent asthma, uncomplicated: Secondary | ICD-10-CM

## 2020-11-10 DIAGNOSIS — F1010 Alcohol abuse, uncomplicated: Secondary | ICD-10-CM | POA: Diagnosis not present

## 2020-11-10 MED ORDER — EPINEPHRINE 0.3 MG/0.3ML IJ SOAJ: INTRAMUSCULAR | 3 refills | Status: DC

## 2020-11-10 MED ORDER — ALBUTEROL SULFATE (2.5 MG/3ML) 0.083% IN NEBU: 2.5000 mg | INHALATION_SOLUTION | Freq: Four times a day (QID) | RESPIRATORY_TRACT | 1 refills | Status: AC | PRN

## 2020-11-10 NOTE — Patient Instructions (Addendum)
Asthma Continue Telergy Ellipta 200-1 puff once a day to prevent cough or wheeze Continue albuterol 2 puffs once every 4 hours as needed for cough or wheeze. Try to use this medication only when needed and not on a regular schedule Continue to follow up with your pulmonary specialist  Allergic rhinitis Continue allergen avoidance measures directed toward grass pollen, weed pollen, tree pollen, and mold as listed below Begin taking an antihistamine once a day as needed for runny nose or itch.Remember to rotate to a different antihistamine about every 3 months. Some examples of over the counter antihistamines include Zyrtec (cetirizine), Xyzal (levocetirizine), Allegra (fexofenadine), and Claritin (loratidine).  Begin Flonase 1 spray in each nostril once a day as needed for stuffy nose. In the right nostril, point the applicator out toward the right ear. In the left nostril, point the applicator out toward the left ear Begin azelastine 1 to 2 sprays in each nostril twice a day as needed for nasal symptoms.  This may help your sinus headache Consider saline nasal rinses as needed for nasal symptoms. Use this before any medicated nasal sprays for best result  Allergic conjunctivitis Some over the counter eye drops include Pataday one drop in each eye once a day as needed for red, itchy eyes OR Zaditor one drop in each eye twice a day as needed for red itchy eyes.  Reflux Continue dietary and lifestyle modifications as listed below Continue to follow up with your gastroenterologist  Food allergy versus food sensitivity We will order a blood test to help [place] evaluate your corn allergy.  We will call you with results as soon as they become available  Call the clinic if this treatment plan is not working well for you.  Follow up in 2 months or sooner if needed.  Reducing Pollen Exposure The American Academy of Allergy, Asthma and Immunology suggests the following steps to reduce your exposure to  pollen during allergy seasons. Do not hang sheets or clothing out to dry; pollen may collect on these items. Do not mow lawns or spend time around freshly cut grass; mowing stirs up pollen. Keep windows closed at night.  Keep car windows closed while driving. Minimize morning activities outdoors, a time when pollen counts are usually at their highest. Stay indoors as much as possible when pollen counts or humidity is high and on windy days when pollen tends to remain in the air longer. Use air conditioning when possible.  Many air conditioners have filters that trap the pollen spores. Use a HEPA room air filter to remove pollen form the indoor air you breathe.  Control of Mold Allergen Mold and fungi can grow on a variety of surfaces provided certain temperature and moisture conditions exist.  Outdoor molds grow on plants, decaying vegetation and soil.  The major outdoor mold, Alternaria and Cladosporium, are found in very high numbers during hot and dry conditions.  Generally, a late Summer - Fall peak is seen for common outdoor fungal spores.  Rain will temporarily lower outdoor mold spore count, but counts rise rapidly when the rainy period ends.  The most important indoor molds are Aspergillus and Penicillium.  Dark, humid and poorly ventilated basements are ideal sites for mold growth.  The next most common sites of mold growth are the bathroom and the kitchen.  Outdoor [employer] Use air conditioning and keep windows closed Avoid exposure to decaying vegetation. Avoid leaf raking. Avoid grain handling. Consider wearing a face mask if working in [employer]  areas.  Indoor Mold Control Maintain humidity below 50%. Clean washable surfaces with 5% bleach solution. Remove sources e.g. Contaminated carpets.

## 2020-11-10 NOTE — Telephone Encounter (Signed)
Called patient about rescheduling appointment from 01/01/21 to 01/05/21 - patient confirmed

## 2020-11-10 NOTE — Progress Notes (Signed)
[place] [address] Dept: [phone]  FOLLOW UP NOTE  Patient ID: Angela Jimenez, female    DOB: 1970/08/16  Age: 50 y.o. MRN: [id number] Date of Office Visit: 11/10/2020  Assessment  Chief Complaint: Follow-up (Asthma symptoms somewhat managed and improved and allergies symptoms have worsen due to having toxic mold in home.)  HPI Angela Jimenez is a 50 year old female who presents to the clinic for follow-up visit. She was last seen in this clinic on 08/01/2019 for evaluation of asthma, allergic rhinitis, allergic conjunctivitis, reflux, and food allergies. At today's visit, she reports her asthma has been moderately well controlled with some intermittent chest tightness and shortness of breath with activity and when exposed to mold at her parents house. She denies cough or wheeze with activity or rest. She continues Trelegy Ellipta 200-1 puff twice a day and uses albuterol daily on a regular schedule. She follows with Dr. Jarome Lamas, pulmonary specialist. Allergic rhinitis is reported as poorly controlled with symptoms including clear rhinorrhea, nasal congestion, sneezing, and post nasal drainage for which she is not currently using any medical intervention. She reports, over the last year and three months since her last visit to this clinic she has been on several prednisone tapers from several different sources. She reports her reflux has been poorly controlled and her gastroenterologist has recently sent omeprazole to a compounding pharmacy to be made without any corn additives as she reports a food allergy to corn. She is interested in a test for corn sensitivity today. She continues to avoid peanuts, tree nuts, almond milk, wheat, and corn products. Her last corn serum IgE on 09/06/2019 was <0.10 and last skin prick test for corn on 06/26/2017 was negative with adequate controls. Her current medications are listed in the chart.    Drug Allergies:  Allergies  Allergen  Reactions   Amoxicillin Anaphylaxis   Ciprofloxacin Anaphylaxis        Molds & Smuts Hives and Shortness Of Breath   Other Anaphylaxis, Hives and Other (See Comments)    Tree nuts, anaphylaxis and migraine headache   Peanut-Containing Drug Products Anaphylaxis   Penicillins Anaphylaxis   Propoxyphene Nausea And Vomiting   Sulfa Antibiotics Other (See Comments)    ASTHMA ATTACK   Wheat Bran    Wool Alcohol [Lanolin] Anaphylaxis    Migraines, asthma, GI upset   Corn-Containing Products Other (See Comments)    Bloated and gassy   Latex Hives   Prednisone Other (See Comments)    "Makes me numb and tingly" increases eye pressure.    Peanut (Diagnostic) Other (See Comments)    Physical Exam: BP 128/78 (BP Location: Right Arm, Patient Position: Sitting, Cuff Size: Normal)   Pulse 94   Temp 98 F (36.7 C) (Temporal)   Resp 18   Ht 5\' 2"  (1.575 m)   Wt 159 lb 3.2 oz (72.2 kg)   SpO2 98%   BMI 29.12 kg/m    Physical Exam Vitals reviewed.  Constitutional:      Appearance: Normal appearance.  HENT:     Head: Normocephalic and atraumatic.     Right Ear: Tympanic membrane normal.     Left Ear: Tympanic membrane normal.     Nose:     Comments: Bilateral nares edematous and pale with clear nasal drainage noted. Pharynx normal. Ears normal. Eyes normal.    Mouth/Throat:     Pharynx: Oropharynx is clear.  Eyes:     Conjunctiva/sclera: Conjunctivae normal.  Cardiovascular:  Rate and Rhythm: Normal rate and regular rhythm.     Heart sounds: Normal heart sounds. No murmur heard. Pulmonary:     Effort: Pulmonary effort is normal.     Breath sounds: Normal breath sounds.     Comments: Lungs clear to auscultation Musculoskeletal:        General: Normal range of motion.     Cervical back: Normal range of motion and neck supple.  Skin:    General: Skin is warm and dry.  Neurological:     Mental Status: She is alert and oriented to person, place, and time.  Psychiatric:         Mood and Affect: Mood normal.        Behavior: Behavior normal.        Thought Content: Thought content normal.        Judgment: Judgment normal.    Diagnostics: FVC 2.20, FEV1 1.76.  Predicted FVC 2.75, predicted FEV1 2.23.  Spirometry indicates normal ventilatory function.  FVC 2.52, FEV1 1.94.  Postbronchodilator spirometry indicates 15% improvement in FVC and 10% improvement in FEV1  Assessment and Plan: 1. Moderate persistent asthma without complication   2. Seasonal and perennial allergic rhinitis   3. Seasonal allergic conjunctivitis   4. Gastroesophageal reflux disease without esophagitis   5. Anaphylactic shock due to food, subsequent encounter   6. Food intolerance     Meds ordered this encounter  Medications   albuterol (PROVENTIL) (2.5 MG/3ML) 0.083% nebulizer solution    Sig: Take 3 mLs (2.5 mg total) by nebulization every 6 (six) hours as needed for wheezing or shortness of breath.    Dispense:  75 mL    Refill:  1   EPINEPHrine 0.3 mg/0.3 mL IJ SOAJ injection    Sig: Use as directed for severe allergic reaction.    Dispense:  2 each    Refill:  3    Dispense one box of 2 devices each.  Dispense TEVA or MYLAN brand epinephrine.    Patient Instructions  Asthma Continue Telergy Ellipta 200-1 puff once a day to prevent cough or wheeze Continue albuterol 2 puffs once every 4 hours as needed for cough or wheeze. Try to use this medication only when needed and not on a regular schedule Continue to follow up with your pulmonary specialist  Allergic rhinitis Continue allergen avoidance measures directed toward grass pollen, weed pollen, tree pollen, and mold as listed below Begin taking an antihistamine once a day as needed for runny nose or itch.Remember to rotate to a different antihistamine about every 3 months. Some examples of over the counter antihistamines include Zyrtec (cetirizine), Xyzal (levocetirizine), Allegra (fexofenadine), and Claritin (loratidine).   Begin Flonase 1 spray in each nostril once a day as needed for stuffy nose. In the right nostril, point the applicator out toward the right ear. In the left nostril, point the applicator out toward the left ear Begin azelastine 1 to 2 sprays in each nostril twice a day as needed for nasal symptoms.  This may help your sinus headache Consider saline nasal rinses as needed for nasal symptoms. Use this before any medicated nasal sprays for best result  Allergic conjunctivitis Some over the counter eye drops include Pataday one drop in each eye once a day as needed for red, itchy eyes OR Zaditor one drop in each eye twice a day as needed for red itchy eyes.  Reflux Continue dietary and lifestyle modifications as listed below Continue to follow up with your  gastroenterologist  Food allergy versus food sensitivity We will order a blood test to help [place] evaluate your corn allergy.  We will call you with results as soon as they become available  Call the clinic if this treatment plan is not working well for you.  Follow up in 2 months or sooner if needed.   Return in about 2 months (around 01/10/2021), or if symptoms worsen or fail to improve.    Thank you for the opportunity to care for this patient.  Please do not hesitate to contact me with questions.  Gareth Morgan, FNP [facility]

## 2020-11-11 ENCOUNTER — Telehealth: Payer: Self-pay

## 2020-11-11 NOTE — Telephone Encounter (Signed)
Spoke to someone at [facility] for [place] for albuterol .083% solutions in which she stated that the pharmacist has to run it under Part B and not D. When calling [facility] back I spoke with Juliann Pulse the pharmacist and told her to run it under Part B. The albuterol 0.083% went through. I called the patient that her albuterol 0.083% was approved with a 75 cents co pay.

## 2020-11-12 ENCOUNTER — Encounter: Payer: Self-pay | Admitting: Family Medicine

## 2020-11-16 DIAGNOSIS — M545 Low back pain, unspecified: Secondary | ICD-10-CM | POA: Diagnosis not present

## 2020-11-16 DIAGNOSIS — M542 Cervicalgia: Secondary | ICD-10-CM | POA: Diagnosis not present

## 2020-11-20 DIAGNOSIS — E0590 Thyrotoxicosis, unspecified without thyrotoxic crisis or storm: Secondary | ICD-10-CM | POA: Diagnosis not present

## 2020-11-20 DIAGNOSIS — Z79899 Other long term (current) drug therapy: Secondary | ICD-10-CM | POA: Diagnosis not present

## 2020-11-20 DIAGNOSIS — M545 Low back pain, unspecified: Secondary | ICD-10-CM | POA: Diagnosis not present

## 2020-11-20 DIAGNOSIS — Z131 Encounter for screening for diabetes mellitus: Secondary | ICD-10-CM | POA: Diagnosis not present

## 2020-11-20 DIAGNOSIS — M542 Cervicalgia: Secondary | ICD-10-CM | POA: Diagnosis not present

## 2020-11-20 DIAGNOSIS — Z0000 Encounter for general adult medical examination without abnormal findings: Secondary | ICD-10-CM | POA: Diagnosis not present

## 2020-11-20 DIAGNOSIS — Z1159 Encounter for screening for other viral diseases: Secondary | ICD-10-CM | POA: Diagnosis not present

## 2020-11-20 DIAGNOSIS — E559 Vitamin D deficiency, unspecified: Secondary | ICD-10-CM | POA: Diagnosis not present

## 2020-11-20 DIAGNOSIS — G8929 Other chronic pain: Secondary | ICD-10-CM | POA: Diagnosis not present

## 2020-11-25 DIAGNOSIS — F1010 Alcohol abuse, uncomplicated: Secondary | ICD-10-CM | POA: Diagnosis not present

## 2020-11-26 ENCOUNTER — Other Ambulatory Visit: Payer: Self-pay

## 2020-11-26 ENCOUNTER — Ambulatory Visit (INDEPENDENT_AMBULATORY_CARE_PROVIDER_SITE_OTHER): Payer: PPO | Admitting: Gastroenterology

## 2020-11-26 ENCOUNTER — Encounter: Payer: Self-pay | Admitting: Gastroenterology

## 2020-11-26 VITALS — BP 123/81 | HR 96 | Temp 98.5°F | Ht 62.0 in | Wt 156.4 lb

## 2020-11-26 DIAGNOSIS — K640 First degree hemorrhoids: Secondary | ICD-10-CM | POA: Diagnosis not present

## 2020-11-26 DIAGNOSIS — K5904 Chronic idiopathic constipation: Secondary | ICD-10-CM | POA: Diagnosis not present

## 2020-11-26 NOTE — Progress Notes (Signed)

## 2020-12-02 DIAGNOSIS — F1010 Alcohol abuse, uncomplicated: Secondary | ICD-10-CM | POA: Diagnosis not present

## 2020-12-02 DIAGNOSIS — M47816 Spondylosis without myelopathy or radiculopathy, lumbar region: Secondary | ICD-10-CM | POA: Diagnosis not present

## 2020-12-02 DIAGNOSIS — M542 Cervicalgia: Secondary | ICD-10-CM | POA: Diagnosis not present

## 2020-12-02 DIAGNOSIS — M545 Low back pain, unspecified: Secondary | ICD-10-CM | POA: Diagnosis not present

## 2020-12-07 DIAGNOSIS — F1010 Alcohol abuse, uncomplicated: Secondary | ICD-10-CM | POA: Diagnosis not present

## 2020-12-08 ENCOUNTER — Other Ambulatory Visit: Payer: Self-pay

## 2020-12-10 ENCOUNTER — Telehealth: Payer: Self-pay | Admitting: Gastroenterology

## 2020-12-10 MED ORDER — LINACLOTIDE 72 MCG PO CAPS: 72.0000 ug | ORAL_CAPSULE | Freq: Every day | ORAL | 1 refills | Status: DC

## 2020-12-10 NOTE — Telephone Encounter (Signed)
Sent medication to the pharamcy

## 2020-12-10 NOTE — Telephone Encounter (Signed)
Linzess 72 mg  90 day  [facility] [address].

## 2020-12-16 DIAGNOSIS — M545 Low back pain, unspecified: Secondary | ICD-10-CM | POA: Diagnosis not present

## 2020-12-16 DIAGNOSIS — M542 Cervicalgia: Secondary | ICD-10-CM | POA: Diagnosis not present

## 2020-12-25 DIAGNOSIS — M47816 Spondylosis without myelopathy or radiculopathy, lumbar region: Secondary | ICD-10-CM | POA: Diagnosis not present

## 2020-12-28 DIAGNOSIS — M542 Cervicalgia: Secondary | ICD-10-CM | POA: Diagnosis not present

## 2020-12-28 DIAGNOSIS — Z79899 Other long term (current) drug therapy: Secondary | ICD-10-CM | POA: Diagnosis not present

## 2020-12-28 DIAGNOSIS — M545 Low back pain, unspecified: Secondary | ICD-10-CM | POA: Diagnosis not present

## 2020-12-28 DIAGNOSIS — G8929 Other chronic pain: Secondary | ICD-10-CM | POA: Diagnosis not present

## 2020-12-29 DIAGNOSIS — F1010 Alcohol abuse, uncomplicated: Secondary | ICD-10-CM | POA: Diagnosis not present

## 2021-01-01 ENCOUNTER — Ambulatory Visit: Payer: PPO | Admitting: Family

## 2021-01-01 ENCOUNTER — Other Ambulatory Visit: Payer: PPO

## 2021-01-02 DIAGNOSIS — M545 Low back pain, unspecified: Secondary | ICD-10-CM | POA: Diagnosis not present

## 2021-01-02 DIAGNOSIS — M542 Cervicalgia: Secondary | ICD-10-CM | POA: Diagnosis not present

## 2021-01-05 ENCOUNTER — Inpatient Hospital Stay (HOSPITAL_BASED_OUTPATIENT_CLINIC_OR_DEPARTMENT_OTHER): Payer: PPO | Admitting: Family

## 2021-01-05 ENCOUNTER — Other Ambulatory Visit: Payer: Self-pay

## 2021-01-05 ENCOUNTER — Inpatient Hospital Stay: Payer: PPO | Attending: Hematology & Oncology

## 2021-01-05 ENCOUNTER — Encounter: Payer: Self-pay | Admitting: Family

## 2021-01-05 VITALS — BP 132/69 | HR 84 | Temp 98.4°F | Resp 17 | Wt 153.8 lb

## 2021-01-05 DIAGNOSIS — R5383 Other fatigue: Secondary | ICD-10-CM | POA: Diagnosis not present

## 2021-01-05 DIAGNOSIS — E611 Iron deficiency: Secondary | ICD-10-CM

## 2021-01-05 DIAGNOSIS — N920 Excessive and frequent menstruation with regular cycle: Secondary | ICD-10-CM | POA: Diagnosis not present

## 2021-01-05 DIAGNOSIS — K909 Intestinal malabsorption, unspecified: Secondary | ICD-10-CM | POA: Diagnosis not present

## 2021-01-05 DIAGNOSIS — D500 Iron deficiency anemia secondary to blood loss (chronic): Secondary | ICD-10-CM | POA: Insufficient documentation

## 2021-01-05 DIAGNOSIS — F1010 Alcohol abuse, uncomplicated: Secondary | ICD-10-CM | POA: Diagnosis not present

## 2021-01-05 LAB — CBC WITH DIFFERENTIAL (CANCER CENTER ONLY)
Abs Immature Granulocytes: 0.03 10*3/uL (ref 0.00–0.07)
Basophils Absolute: 0 10*3/uL (ref 0.0–0.1)
Basophils Relative: 1 %
Eosinophils Absolute: 0.1 10*3/uL (ref 0.0–0.5)
Eosinophils Relative: 3 %
HCT: 40.7 % (ref 36.0–46.0)
Hemoglobin: 13.3 g/dL (ref 12.0–15.0)
Immature Granulocytes: 1 %
Lymphocytes Relative: 32 %
Lymphs Abs: 1.3 10*3/uL (ref 0.7–4.0)
MCH: 30.3 pg (ref 26.0–34.0)
MCHC: 32.7 g/dL (ref 30.0–36.0)
MCV: 92.7 fL (ref 80.0–100.0)
Monocytes Absolute: 0.2 10*3/uL (ref 0.1–1.0)
Monocytes Relative: 5 %
Neutro Abs: 2.5 10*3/uL (ref 1.7–7.7)
Neutrophils Relative %: 58 %
Platelet Count: 253 10*3/uL (ref 150–400)
RBC: 4.39 MIL/uL (ref 3.87–5.11)
RDW: 12.7 % (ref 11.5–15.5)
WBC Count: 4.2 10*3/uL (ref 4.0–10.5)
nRBC: 0 % (ref 0.0–0.2)

## 2021-01-05 LAB — RETICULOCYTES
Immature Retic Fract: 10 % (ref 2.3–15.9)
RBC.: 4.35 MIL/uL (ref 3.87–5.11)
Retic Count, Absolute: 49.2 10*3/uL (ref 19.0–186.0)
Retic Ct Pct: 1.1 % (ref 0.4–3.1)

## 2021-01-05 NOTE — Progress Notes (Signed)
Hematology and Oncology Follow Up Visit  Angela Jimenez [id number] [birth year] 50 y.o. 01/05/2021   Principle Diagnosis:  Iron deficiency anemia secondary to malabsorption and heavy cycles   Current Therapy:        Iv iron as indicated    Interim History:  Angela Jimenez is here today for follow-up. She is feeling fatigued and has noted some mild bruising on her arms.  She states that she rarely has a little bright red in her blood with straining due to hemorrhoids.  No other bleeding noted. No petechiae.  She is living with her sister and in the process of moving out of her mold infested home. This has been quite stressful for her.  She notes SOB when she is in her home despite wearing 2 masks as a precaution.  She has also been helping care for her father who has lymphoma.  She denies fever, chills, n/v, cough, rash, dizziness, chest pain, palpitations, abdominal pain or changes in bowel or bladder habits.  No swelling, numbness or tingling in her extremities.  She has chronic lower back and hip pain that waxes and wanes. She states that she also has chronic neck issues.  No falls or syncope.  She has maintained a good appetite and is doing her best to stay well hydrated throughout the day.   ECOG Performance Status: 1 - Symptomatic but completely ambulatory  Medications:  Allergies as of 01/05/2021       Reactions   Amoxicillin Anaphylaxis   Ciprofloxacin Anaphylaxis      Molds & Smuts Hives, Shortness Of Breath   Other Anaphylaxis, Hives, Other (See Comments)   Tree nuts, anaphylaxis and migraine headache   Peanut-containing Drug Products Anaphylaxis   Penicillins Anaphylaxis   Propoxyphene Nausea And Vomiting   Sulfa Antibiotics Other (See Comments)   ASTHMA ATTACK   Wheat Bran    Wool Alcohol [lanolin] Anaphylaxis   Migraines, asthma, GI upset   Corn-containing Products Other (See Comments)   Bloated and gassy   Latex Hives   Prednisone Other (See Comments)   "Makes  me numb and tingly" increases eye pressure.    Peanut (diagnostic) Other (See Comments)        Medication List        Accurate as of January 05, 2021 11:55 AM. If you have any questions, ask your nurse or doctor.          albuterol 108 (90 Base) MCG/ACT inhaler Commonly known as: ProAir HFA Inhale 2 puffs into the lungs every 4 (four) hours as needed (for coughing or wheezing spells).   albuterol 108 (90 Base) MCG/ACT inhaler Commonly known as: VENTOLIN HFA INHALE 2 PUFFS INTO THE LUNGS EVERY 4 HOURS AS NEEDED FOR WHEEZING OR SHORTNESS OF BREATH   albuterol (2.5 MG/3ML) 0.083% nebulizer solution Commonly known as: PROVENTIL Take 3 mLs (2.5 mg total) by nebulization every 6 (six) hours as needed for wheezing or shortness of breath.   buPROPion 150 MG 24 hr tablet Commonly known as: WELLBUTRIN XL Take by mouth.   buPROPion 300 MG 24 hr tablet Commonly known as: WELLBUTRIN XL Take 300 mg by mouth every morning.   COLLAGEN PO Take 10 g by mouth daily.   cyclobenzaprine 10 MG tablet Commonly known as: FLEXERIL Take 10 mg by mouth as needed for muscle spasms.   diphenhydrAMINE 25 MG tablet Commonly known as: BENADRYL Take 25 mg by mouth every 4 (four) hours as needed.   diphenhydrAMINE 25 MG tablet  Commonly known as: SOMINEX Take by mouth.   EPINEPHrine 0.3 mg/0.3 mL Soaj injection Commonly known as: EPI-PEN Use as directed for severe allergic reaction.   guanFACINE 1 MG tablet Commonly known as: TENEX Take by mouth.   HYDROcodone-acetaminophen 5-325 MG tablet Commonly known as: NORCO/VICODIN Take 0.5-1 tablets by mouth 2 (two) times daily as needed.   IBgard 90 MG Cpcr Generic drug: Peppermint Oil   linaclotide 72 MCG capsule Commonly known as: Linzess Take 1 capsule (72 mcg total) by mouth daily before breakfast.   loratadine 10 MG tablet Commonly known as: CLARITIN Take by mouth.   montelukast 10 MG tablet Commonly known as: SINGULAIR Take 1  tablet (10 mg total) by mouth at bedtime.   MSM PO Take 1,000 mg by mouth daily.   MULTIVITAMIN ADULT PO Take 3 tablets by mouth daily.   Olopatadine HCl 0.2 % Soln One drop each eye once a day as needed for itchy eyes.   omeprazole 20 MG capsule Commonly known as: PRILOSEC Take 1 capsule (20 mg total) by mouth 2 (two) times daily.   OVER THE COUNTER MEDICATION Mushroon Matcha Drink mix daily   OVER THE COUNTER MEDICATION Matcha powder mix in food or water and drink once a day   OVER THE COUNTER MEDICATION Sovereign Silver immune support take 6 droppers full once a day   pantoprazole 40 MG tablet Commonly known as: PROTONIX Take 1 tablet (40 mg total) by mouth daily.   polyethylene glycol powder 17 GM/SCOOP powder Commonly known as: GLYCOLAX/MIRALAX   pseudoephedrine 30 MG tablet Commonly known as: SUDAFED Take 30 mg by mouth every 4 (four) hours as needed for congestion.   Spiriva Respimat 1.25 MCG/ACT Aers Generic drug: Tiotropium Bromide Monohydrate Inhale 2 puffs into the lungs daily.   traMADol 50 MG tablet Commonly known as: ULTRAM   Trelegy Ellipta 200-62.5-25 MCG/INH Aepb Generic drug: Fluticasone-Umeclidin-Vilant INHALE 1 PUFF BY MOUTH DAILY   triamcinolone 55 MCG/ACT Aero nasal inhaler Commonly known as: NASACORT One spray each nostril twice a day as needed   zafirlukast 20 MG tablet Commonly known as: ACCOLATE Take 20 mg by mouth 2 (two) times daily.        Allergies:  Allergies  Allergen Reactions   Amoxicillin Anaphylaxis   Ciprofloxacin Anaphylaxis        Molds & Smuts Hives and Shortness Of Breath   Other Anaphylaxis, Hives and Other (See Comments)    Tree nuts, anaphylaxis and migraine headache   Peanut-Containing Drug Products Anaphylaxis   Penicillins Anaphylaxis   Propoxyphene Nausea And Vomiting   Sulfa Antibiotics Other (See Comments)    ASTHMA ATTACK   Wheat Bran    Wool Alcohol [Lanolin] Anaphylaxis    Migraines,  asthma, GI upset   Corn-Containing Products Other (See Comments)    Bloated and gassy   Latex Hives   Prednisone Other (See Comments)    "Makes me numb and tingly" increases eye pressure.    Peanut (Diagnostic) Other (See Comments)    Past Medical History, Surgical history, Social history, and Family History were reviewed and updated.  Review of Systems: All other 10 point review of systems is negative.   Physical Exam:  vitals were not taken for this visit.   Wt Readings from Last 3 Encounters:  11/26/20 156 lb 6 oz (70.9 kg)  11/10/20 159 lb 3.2 oz (72.2 kg)  11/04/20 160 lb (72.6 kg)    Ocular: Sclerae unicteric, pupils equal, round and reactive to light  Ear-nose-throat: Oropharynx clear, dentition fair Lymphatic: No cervical or supraclavicular adenopathy Lungs no rales or rhonchi, good excursion bilaterally Heart regular rate and rhythm, no murmur appreciated Abd soft, nontender, positive bowel sounds MSK no focal spinal tenderness, no joint edema Neuro: non-focal, well-oriented, appropriate affect Breasts: Deferred   Lab Results  Component Value Date   WBC 4.2 01/05/2021   HGB 13.3 01/05/2021   HCT 40.7 01/05/2021   MCV 92.7 01/05/2021   PLT 253 01/05/2021   Lab Results  Component Value Date   FERRITIN 564 (H) 09/29/2020   IRON 183 (H) 09/29/2020   TIBC 339 09/29/2020   UIBC 156 09/29/2020   IRONPCTSAT 54 09/29/2020   Lab Results  Component Value Date   RETICCTPCT 1.1 01/05/2021   RBC 4.35 01/05/2021   No results found for: KPAFRELGTCHN, LAMBDASER, KAPLAMBRATIO No results found for: IGGSERUM, IGA, IGMSERUM No results found for: Ronnald Ramp, A1GS, A2GS, Violet Baldy, MSPIKE, SPEI   Chemistry      Component Value Date/Time   NA 138 09/29/2020 1323   K 3.7 09/29/2020 1323   CL 100 09/29/2020 1323   CO2 32 09/29/2020 1323   BUN 10 09/29/2020 1323   CREATININE 1.08 (H) 09/29/2020 1323      Component Value Date/Time   CALCIUM 9.4  09/29/2020 1323   ALKPHOS 47 09/29/2020 1323   AST 15 09/29/2020 1323   ALT 17 09/29/2020 1323   BILITOT 0.3 09/29/2020 1323       Impression and Plan: Ms. Crispi is a very pleasant 50 yo African American female with iron deficiency anemia secondary to cycles and malabsorption.  Iron studies are pending. We will replace if needed.  Follow-up in 3 months.  She can contact our office with any questions or concerns.   Laverna Peace, NP 8/16/202211:55 AM

## 2021-01-06 ENCOUNTER — Telehealth: Payer: Self-pay | Admitting: *Deleted

## 2021-01-06 LAB — FERRITIN: Ferritin: 127 ng/mL (ref 11–307)

## 2021-01-06 LAB — IRON AND TIBC
Iron: 98 ug/dL (ref 41–142)
Saturation Ratios: 34 % (ref 21–57)
TIBC: 290 ug/dL (ref 236–444)
UIBC: 192 ug/dL (ref 120–384)

## 2021-01-06 NOTE — Telephone Encounter (Signed)
Per 01/05/21 los - called and was unable to lvm - mailbox is full - mailed calendar

## 2021-01-12 DIAGNOSIS — F1010 Alcohol abuse, uncomplicated: Secondary | ICD-10-CM | POA: Diagnosis not present

## 2021-01-16 DIAGNOSIS — M542 Cervicalgia: Secondary | ICD-10-CM | POA: Diagnosis not present

## 2021-01-16 DIAGNOSIS — M545 Low back pain, unspecified: Secondary | ICD-10-CM | POA: Diagnosis not present

## 2021-01-28 DIAGNOSIS — F1010 Alcohol abuse, uncomplicated: Secondary | ICD-10-CM | POA: Diagnosis not present

## 2021-01-29 DIAGNOSIS — G8929 Other chronic pain: Secondary | ICD-10-CM | POA: Diagnosis not present

## 2021-01-29 DIAGNOSIS — R498 Other voice and resonance disorders: Secondary | ICD-10-CM | POA: Diagnosis not present

## 2021-01-29 DIAGNOSIS — Z20822 Contact with and (suspected) exposure to covid-19: Secondary | ICD-10-CM | POA: Diagnosis not present

## 2021-01-29 DIAGNOSIS — Z789 Other specified health status: Secondary | ICD-10-CM | POA: Diagnosis not present

## 2021-01-29 DIAGNOSIS — M545 Low back pain, unspecified: Secondary | ICD-10-CM | POA: Diagnosis not present

## 2021-02-01 ENCOUNTER — Ambulatory Visit (INDEPENDENT_AMBULATORY_CARE_PROVIDER_SITE_OTHER): Payer: PPO | Admitting: Dermatology

## 2021-02-01 ENCOUNTER — Other Ambulatory Visit: Payer: Self-pay

## 2021-02-01 DIAGNOSIS — L905 Scar conditions and fibrosis of skin: Secondary | ICD-10-CM

## 2021-02-01 DIAGNOSIS — L810 Postinflammatory hyperpigmentation: Secondary | ICD-10-CM | POA: Diagnosis not present

## 2021-02-01 DIAGNOSIS — L730 Acne keloid: Secondary | ICD-10-CM

## 2021-02-01 DIAGNOSIS — L821 Other seborrheic keratosis: Secondary | ICD-10-CM | POA: Diagnosis not present

## 2021-02-01 MED ORDER — TRETINOIN 0.025 % EX CREA: TOPICAL_CREAM | Freq: Every day | CUTANEOUS | 6 refills | Status: AC
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
## 2021-02-01 NOTE — Patient Instructions (Addendum)
Gentle Skin Care Guide  1. Bathe no more than once a day.  2. Avoid bathing in hot water  3. Use a mild soap like Dove, Vanicream, Cetaphil, CeraVe. Can use Lever 2000 or Cetaphil antibacterial soap  4. Use soap only where you need it. On most days, use it under your arms, between your legs, and on your feet. Let the water rinse other areas unless visibly dirty.  5. When you get out of the bath/shower, use a towel to gently blot your skin dry, don't rub it.  6. While your skin is still a little damp, apply a moisturizing cream such as Vanicream, CeraVe, Cetaphil, Eucerin, Sarna lotion or plain Vaseline Jelly. For hands apply Neutrogena [place] Hand Cream or Excipial Hand Cream.  7. Reapply moisturizer any time you start to itch or feel dry.  8. Sometimes using free and clear laundry detergents can be helpful. Fabric softener sheets should be avoided. Downy Free & Gentle liquid, or any liquid fabric softener that is free of dyes and perfumes, it acceptable to use  9. If your doctor has given you prescription creams you may apply moisturizers over them     Recommend daily broad spectrum sunscreen SPF 30+ to sun-exposed areas, reapply every 2 hours as needed. Call for new or changing lesions.  Staying in the shade or wearing long sleeves, sun glasses (UVA+UVB protection) and wide brim hats (4-inch brim around the entire circumference of the hat) are also recommended for sun protection.    Recommend an Elta MD tinted sunscreen

## 2021-02-02 DIAGNOSIS — M545 Low back pain, unspecified: Secondary | ICD-10-CM | POA: Diagnosis not present

## 2021-02-02 DIAGNOSIS — M542 Cervicalgia: Secondary | ICD-10-CM | POA: Diagnosis not present

## 2021-02-02 DIAGNOSIS — F1010 Alcohol abuse, uncomplicated: Secondary | ICD-10-CM | POA: Diagnosis not present

## 2021-02-03 ENCOUNTER — Encounter: Payer: Self-pay | Admitting: Dermatology

## 2021-02-09 DIAGNOSIS — F1010 Alcohol abuse, uncomplicated: Secondary | ICD-10-CM | POA: Diagnosis not present

## 2021-02-16 DIAGNOSIS — M545 Low back pain, unspecified: Secondary | ICD-10-CM | POA: Diagnosis not present

## 2021-02-16 DIAGNOSIS — M542 Cervicalgia: Secondary | ICD-10-CM | POA: Diagnosis not present

## 2021-02-22 DIAGNOSIS — M47816 Spondylosis without myelopathy or radiculopathy, lumbar region: Secondary | ICD-10-CM | POA: Diagnosis not present

## 2021-02-22 DIAGNOSIS — M47812 Spondylosis without myelopathy or radiculopathy, cervical region: Secondary | ICD-10-CM | POA: Diagnosis not present

## 2021-02-23 DIAGNOSIS — F1010 Alcohol abuse, uncomplicated: Secondary | ICD-10-CM | POA: Diagnosis not present

## 2021-03-04 DIAGNOSIS — M542 Cervicalgia: Secondary | ICD-10-CM | POA: Diagnosis not present

## 2021-03-04 DIAGNOSIS — M545 Low back pain, unspecified: Secondary | ICD-10-CM | POA: Diagnosis not present

## 2021-03-09 DIAGNOSIS — M545 Low back pain, unspecified: Secondary | ICD-10-CM | POA: Diagnosis not present

## 2021-03-09 DIAGNOSIS — M542 Cervicalgia: Secondary | ICD-10-CM | POA: Diagnosis not present

## 2021-03-09 DIAGNOSIS — R7303 Prediabetes: Secondary | ICD-10-CM | POA: Diagnosis not present

## 2021-03-09 DIAGNOSIS — F1010 Alcohol abuse, uncomplicated: Secondary | ICD-10-CM | POA: Diagnosis not present

## 2021-03-09 DIAGNOSIS — Z79899 Other long term (current) drug therapy: Secondary | ICD-10-CM | POA: Diagnosis not present

## 2021-03-09 DIAGNOSIS — G8929 Other chronic pain: Secondary | ICD-10-CM | POA: Diagnosis not present

## 2021-03-09 DIAGNOSIS — E559 Vitamin D deficiency, unspecified: Secondary | ICD-10-CM | POA: Diagnosis not present

## 2021-03-16 DIAGNOSIS — M47812 Spondylosis without myelopathy or radiculopathy, cervical region: Secondary | ICD-10-CM | POA: Diagnosis not present

## 2021-03-18 DIAGNOSIS — M542 Cervicalgia: Secondary | ICD-10-CM | POA: Diagnosis not present

## 2021-03-18 DIAGNOSIS — M545 Low back pain, unspecified: Secondary | ICD-10-CM | POA: Diagnosis not present

## 2021-03-23 DIAGNOSIS — F1010 Alcohol abuse, uncomplicated: Secondary | ICD-10-CM | POA: Diagnosis not present

## 2021-03-23 DIAGNOSIS — J301 Allergic rhinitis due to pollen: Secondary | ICD-10-CM | POA: Diagnosis not present

## 2021-04-04 DIAGNOSIS — M545 Low back pain, unspecified: Secondary | ICD-10-CM | POA: Diagnosis not present

## 2021-04-04 DIAGNOSIS — M542 Cervicalgia: Secondary | ICD-10-CM | POA: Diagnosis not present

## 2021-04-07 ENCOUNTER — Inpatient Hospital Stay: Payer: PPO

## 2021-04-07 ENCOUNTER — Inpatient Hospital Stay: Payer: PPO | Admitting: Family

## 2021-04-12 DIAGNOSIS — M545 Low back pain, unspecified: Secondary | ICD-10-CM | POA: Diagnosis not present

## 2021-04-12 DIAGNOSIS — Z789 Other specified health status: Secondary | ICD-10-CM | POA: Diagnosis not present

## 2021-04-12 DIAGNOSIS — Z79899 Other long term (current) drug therapy: Secondary | ICD-10-CM | POA: Diagnosis not present

## 2021-04-12 DIAGNOSIS — G8929 Other chronic pain: Secondary | ICD-10-CM | POA: Diagnosis not present

## 2021-04-12 DIAGNOSIS — Z0000 Encounter for general adult medical examination without abnormal findings: Secondary | ICD-10-CM | POA: Diagnosis not present

## 2021-04-18 DIAGNOSIS — M545 Low back pain, unspecified: Secondary | ICD-10-CM | POA: Diagnosis not present

## 2021-04-18 DIAGNOSIS — M542 Cervicalgia: Secondary | ICD-10-CM | POA: Diagnosis not present

## 2021-04-20 DIAGNOSIS — F1010 Alcohol abuse, uncomplicated: Secondary | ICD-10-CM | POA: Diagnosis not present

## 2021-04-26 DIAGNOSIS — J4540 Moderate persistent asthma, uncomplicated: Secondary | ICD-10-CM | POA: Diagnosis not present

## 2021-04-26 DIAGNOSIS — J45909 Unspecified asthma, uncomplicated: Secondary | ICD-10-CM | POA: Diagnosis not present

## 2021-04-26 DIAGNOSIS — R0602 Shortness of breath: Secondary | ICD-10-CM | POA: Diagnosis not present

## 2021-04-27 ENCOUNTER — Inpatient Hospital Stay: Payer: PPO | Attending: Family

## 2021-04-27 ENCOUNTER — Other Ambulatory Visit: Payer: Self-pay

## 2021-04-27 ENCOUNTER — Encounter: Payer: Self-pay | Admitting: Family

## 2021-04-27 ENCOUNTER — Inpatient Hospital Stay (HOSPITAL_BASED_OUTPATIENT_CLINIC_OR_DEPARTMENT_OTHER): Payer: PPO | Admitting: Family

## 2021-04-27 VITALS — BP 124/66 | HR 82 | Temp 99.0°F | Resp 17 | Wt 145.1 lb

## 2021-04-27 DIAGNOSIS — D500 Iron deficiency anemia secondary to blood loss (chronic): Secondary | ICD-10-CM | POA: Insufficient documentation

## 2021-04-27 DIAGNOSIS — K909 Intestinal malabsorption, unspecified: Secondary | ICD-10-CM | POA: Diagnosis not present

## 2021-04-27 DIAGNOSIS — N920 Excessive and frequent menstruation with regular cycle: Secondary | ICD-10-CM | POA: Diagnosis not present

## 2021-04-27 DIAGNOSIS — D508 Other iron deficiency anemias: Secondary | ICD-10-CM

## 2021-04-27 LAB — CBC WITH DIFFERENTIAL (CANCER CENTER ONLY)
Abs Immature Granulocytes: 0.01 10*3/uL (ref 0.00–0.07)
Basophils Absolute: 0 10*3/uL (ref 0.0–0.1)
Basophils Relative: 1 %
Eosinophils Absolute: 0.1 10*3/uL (ref 0.0–0.5)
Eosinophils Relative: 2 %
HCT: 39.7 % (ref 36.0–46.0)
Hemoglobin: 13.3 g/dL (ref 12.0–15.0)
Immature Granulocytes: 0 %
Lymphocytes Relative: 40 %
Lymphs Abs: 1.5 10*3/uL (ref 0.7–4.0)
MCH: 30.8 pg (ref 26.0–34.0)
MCHC: 33.5 g/dL (ref 30.0–36.0)
MCV: 91.9 fL (ref 80.0–100.0)
Monocytes Absolute: 0.2 10*3/uL (ref 0.1–1.0)
Monocytes Relative: 5 %
Neutro Abs: 2 10*3/uL (ref 1.7–7.7)
Neutrophils Relative %: 52 %
Platelet Count: 251 10*3/uL (ref 150–400)
RBC: 4.32 MIL/uL (ref 3.87–5.11)
RDW: 12.7 % (ref 11.5–15.5)
WBC Count: 3.9 10*3/uL — ABNORMAL LOW (ref 4.0–10.5)
nRBC: 0 % (ref 0.0–0.2)

## 2021-04-27 LAB — RETICULOCYTES
Immature Retic Fract: 8.6 % (ref 2.3–15.9)
RBC.: 4.3 MIL/uL (ref 3.87–5.11)
Retic Count, Absolute: 43 10*3/uL (ref 19.0–186.0)
Retic Ct Pct: 1 % (ref 0.4–3.1)

## 2021-04-27 NOTE — Progress Notes (Signed)
Hematology and Oncology Follow Up Visit  Angela Jimenez [id number] 1971-04-28 50 y.o. 04/27/2021   Principle Diagnosis:  Iron deficiency anemia secondary to malabsorption and heavy cycles   Current Therapy:        Iv iron as indicated    Interim History:  [name] is here today for follow-up. She is symptomatic with fatigue. She has been quite busy with work and family.  No blood loss noted. No bruising or petechiae.  She denies fever, chills, n/v, cough, rash, dizziness, SOB, chest pain, palpitations, abdominal pain or changes in bowel or bladder habits.  No swelling in her extremities at this time.  No falls or syncope to report.  Occasional tingling in the hands due to chronic neck issues.  She notes that with work she is not eat well but that she is staying well hydrated. Her weight is stable at 145 lbs.   ECOG Performance Status: 1 - Symptomatic but completely ambulatory  Medications:  Allergies as of 04/27/2021       Reactions   Amoxicillin Anaphylaxis   Ciprofloxacin Anaphylaxis      Molds & Smuts Hives, Shortness Of Breath   Other Anaphylaxis, Hives, Other (See Comments)   Tree nuts, anaphylaxis and migraine headache   Peanut-containing Drug Products Anaphylaxis   Penicillins Anaphylaxis   Propoxyphene Nausea And Vomiting   Sulfa Antibiotics Other (See Comments)   ASTHMA ATTACK   Wheat Bran    Wool Alcohol [lanolin] Anaphylaxis   Migraines, asthma, GI upset   Corn-containing Products Other (See Comments)   Bloated and gassy   Latex Hives   Prednisone Other (See Comments)   "Makes me numb and tingly" increases eye pressure.    Peanut (diagnostic) Other (See Comments)        Medication List        Accurate as of April 27, 2021  3:38 PM. If you have any questions, ask your nurse or doctor.          albuterol 108 (90 Base) MCG/ACT inhaler Commonly known as: ProAir HFA Inhale 2 puffs into the lungs every 4 (four) hours as needed (for coughing or  wheezing spells).   albuterol 108 (90 Base) MCG/ACT inhaler Commonly known as: VENTOLIN HFA INHALE 2 PUFFS INTO THE LUNGS EVERY 4 HOURS AS NEEDED FOR WHEEZING OR SHORTNESS OF BREATH   albuterol (2.5 MG/3ML) 0.083% nebulizer solution Commonly known as: PROVENTIL Take 3 mLs (2.5 mg total) by nebulization every 6 (six) hours as needed for wheezing or shortness of breath.   buPROPion 150 MG 24 hr tablet Commonly known as: WELLBUTRIN XL Take by mouth.   buPROPion 300 MG 24 hr tablet Commonly known as: WELLBUTRIN XL Take 300 mg by mouth every morning.   clonazePAM 0.5 MG tablet Commonly known as: KLONOPIN Take 0.5 mg by mouth 2 (two) times daily as needed (for back pain).   COLLAGEN PO Take 10 g by mouth daily.   cyclobenzaprine 10 MG tablet Commonly known as: FLEXERIL Take 10 mg by mouth as needed for muscle spasms.   diphenhydrAMINE 25 MG tablet Commonly known as: BENADRYL Take 25 mg by mouth every 4 (four) hours as needed.   diphenhydrAMINE 25 MG tablet Commonly known as: SOMINEX Take by mouth.   EPINEPHrine 0.3 mg/0.3 mL Soaj injection Commonly known as: EPI-PEN Use as directed for severe allergic reaction.   GOODY HEADACHE PO Take by mouth as needed.   guanFACINE 1 MG tablet Commonly known as: TENEX Take by mouth.  HYDROcodone-acetaminophen 5-325 MG tablet Commonly known as: NORCO/VICODIN Take 0.5-1 tablets by mouth 2 (two) times daily as needed.   IBgard 90 MG Cpcr Generic drug: Peppermint Oil   linaclotide 72 MCG capsule Commonly known as: Linzess Take 1 capsule (72 mcg total) by mouth daily before breakfast. What changed: additional instructions   loratadine 10 MG tablet Commonly known as: CLARITIN Take 10 mg by mouth daily.   montelukast 10 MG tablet Commonly known as: SINGULAIR Take 1 tablet (10 mg total) by mouth at bedtime.   MSM PO Take 1,000 mg by mouth daily.   MULTIVITAMIN ADULT PO Take 3 tablets by mouth daily.   NON  FORMULARY Take 6-10 drops by mouth daily at 6 (six) AM. "Harmony" crystal/oxygenated water   Olopatadine HCl 0.2 % Soln One drop each eye once a day as needed for itchy eyes.   omeprazole 20 MG capsule Commonly known as: PRILOSEC Take 1 capsule (20 mg total) by mouth 2 (two) times daily.   OVER THE COUNTER MEDICATION Mushroon Matcha Drink mix daily   OVER THE COUNTER MEDICATION Matcha powder mix in food or water and drink once a day   OVER THE COUNTER MEDICATION Sovereign Silver immune support take 6 droppers full once a day   pantoprazole 40 MG tablet Commonly known as: PROTONIX Take 1 tablet (40 mg total) by mouth daily.   polyethylene glycol powder 17 GM/SCOOP powder Commonly known as: GLYCOLAX/MIRALAX   pseudoephedrine 30 MG tablet Commonly known as: SUDAFED Take 30 mg by mouth every 4 (four) hours as needed for congestion.   Spiriva Respimat 1.25 MCG/ACT Aers Generic drug: Tiotropium Bromide Monohydrate Inhale 2 puffs into the lungs daily.   traMADol 50 MG tablet Commonly known as: ULTRAM   Trelegy Ellipta 200-62.5-25 MCG/ACT Aepb Generic drug: Fluticasone-Umeclidin-Vilant INHALE 1 PUFF BY MOUTH DAILY   tretinoin 0.025 % cream Commonly known as: RETIN-A Apply topically at bedtime. As directed   triamcinolone 55 MCG/ACT Aero nasal inhaler Commonly known as: NASACORT One spray each nostril twice a day as needed   zafirlukast 20 MG tablet Commonly known as: ACCOLATE Take 20 mg by mouth 2 (two) times daily.        Allergies:  Allergies  Allergen Reactions   Amoxicillin Anaphylaxis   Ciprofloxacin Anaphylaxis        Molds & Smuts Hives and Shortness Of Breath   Other Anaphylaxis, Hives and Other (See Comments)    Tree nuts, anaphylaxis and migraine headache   Peanut-Containing Drug Products Anaphylaxis   Penicillins Anaphylaxis   Propoxyphene Nausea And Vomiting   Sulfa Antibiotics Other (See Comments)    ASTHMA ATTACK   Wheat Bran    Wool  Alcohol [Lanolin] Anaphylaxis    Migraines, asthma, GI upset   Corn-Containing Products Other (See Comments)    Bloated and gassy   Latex Hives   Prednisone Other (See Comments)    "Makes me numb and tingly" increases eye pressure.    Peanut (Diagnostic) Other (See Comments)    Past Medical History, Surgical history, Social history, and Family History were reviewed and updated.  Review of Systems: All other 10 point review of systems is negative.   Physical Exam:  weight is 145 lb 1.9 oz (65.8 kg). Her oral temperature is 99 F (37.2 C). Her blood pressure is 124/66 and her pulse is 82. Her respiration is 17 and oxygen saturation is 100%.   Wt Readings from Last 3 Encounters:  04/27/21 145 lb 1.9 oz (65.8 kg)  01/05/21 153 lb 12.8 oz (69.8 kg)  11/26/20 156 lb 6 oz (70.9 kg)    Ocular: Sclerae unicteric, pupils equal, round and reactive to light Ear-nose-throat: Oropharynx clear, dentition fair Lymphatic: No cervical or supraclavicular adenopathy Lungs no rales or rhonchi, good excursion bilaterally Heart regular rate and rhythm, no murmur appreciated Abd soft, nontender, positive bowel sounds MSK no focal spinal tenderness, no joint edema Neuro: non-focal, well-oriented, appropriate affect Breasts: Deferred   Lab Results  Component Value Date   WBC 3.9 (L) 04/27/2021   HGB 13.3 04/27/2021   HCT 39.7 04/27/2021   MCV 91.9 04/27/2021   PLT 251 04/27/2021   Lab Results  Component Value Date   FERRITIN 127 01/05/2021   IRON 98 01/05/2021   TIBC 290 01/05/2021   UIBC 192 01/05/2021   IRONPCTSAT 34 01/05/2021   Lab Results  Component Value Date   RETICCTPCT 1.0 04/27/2021   RBC 4.30 04/27/2021   No results found for: KPAFRELGTCHN, LAMBDASER, KAPLAMBRATIO No results found for: IGGSERUM, IGA, IGMSERUM No results found for: Ronnald Ramp, A1GS, A2GS, Violet Baldy, MSPIKE, SPEI   Chemistry      Component Value Date/Time   NA 138 09/29/2020 1323    K 3.7 09/29/2020 1323   CL 100 09/29/2020 1323   CO2 32 09/29/2020 1323   BUN 10 09/29/2020 1323   CREATININE 1.08 (H) 09/29/2020 1323      Component Value Date/Time   CALCIUM 9.4 09/29/2020 1323   ALKPHOS 47 09/29/2020 1323   AST 15 09/29/2020 1323   ALT 17 09/29/2020 1323   BILITOT 0.3 09/29/2020 1323       Impression and Plan: Ms. Booze is a very pleasant 50 yo African American female with iron deficiency anemia secondary to cycles and malabsorption.  Iron studies are pending. We will replace if needed  Follow-up in 3 months.   Lottie Dawson, NP 12/6/20223:38 PM

## 2021-04-28 LAB — IRON AND TIBC
Iron: 97 ug/dL (ref 41–142)
Saturation Ratios: 31 % (ref 21–57)
TIBC: 316 ug/dL (ref 236–444)
UIBC: 219 ug/dL (ref 120–384)

## 2021-04-28 LAB — FERRITIN: Ferritin: 103 ng/mL (ref 11–307)

## 2021-04-30 ENCOUNTER — Telehealth: Payer: Self-pay | Admitting: *Deleted

## 2021-04-30 NOTE — Telephone Encounter (Signed)
Patient notified per order of S. Eulas Post NP that her iron results "look good and no IV iron is needed at this time."  Pt is appreciative of call and has no questions at this time.

## 2021-05-04 DIAGNOSIS — F1010 Alcohol abuse, uncomplicated: Secondary | ICD-10-CM | POA: Diagnosis not present

## 2021-05-04 DIAGNOSIS — M542 Cervicalgia: Secondary | ICD-10-CM | POA: Diagnosis not present

## 2021-05-04 DIAGNOSIS — M545 Low back pain, unspecified: Secondary | ICD-10-CM | POA: Diagnosis not present

## 2021-05-10 DIAGNOSIS — Z79899 Other long term (current) drug therapy: Secondary | ICD-10-CM | POA: Diagnosis not present

## 2021-05-10 DIAGNOSIS — G8929 Other chronic pain: Secondary | ICD-10-CM | POA: Diagnosis not present

## 2021-05-10 DIAGNOSIS — Z0130 Encounter for examination of blood pressure without abnormal findings: Secondary | ICD-10-CM | POA: Diagnosis not present

## 2021-05-10 DIAGNOSIS — M545 Low back pain, unspecified: Secondary | ICD-10-CM | POA: Diagnosis not present

## 2021-05-10 DIAGNOSIS — Z789 Other specified health status: Secondary | ICD-10-CM | POA: Diagnosis not present

## 2021-05-18 DIAGNOSIS — M545 Low back pain, unspecified: Secondary | ICD-10-CM | POA: Diagnosis not present

## 2021-05-18 DIAGNOSIS — F1010 Alcohol abuse, uncomplicated: Secondary | ICD-10-CM | POA: Diagnosis not present

## 2021-05-18 DIAGNOSIS — M542 Cervicalgia: Secondary | ICD-10-CM | POA: Diagnosis not present

## 2021-07-20 ENCOUNTER — Other Ambulatory Visit: Payer: Self-pay

## 2021-07-20 NOTE — Telephone Encounter (Signed)
Last office visit 11/26/20 Hemorrhoids constipations   Last refill 12/10/20 1 refills

## 2021-07-21 MED ORDER — LINACLOTIDE 72 MCG PO CAPS: 72.0000 ug | ORAL_CAPSULE | Freq: Every day | ORAL | 1 refills | Status: DC

## 2021-07-26 ENCOUNTER — Inpatient Hospital Stay: Payer: PPO | Admitting: Family

## 2021-07-26 ENCOUNTER — Inpatient Hospital Stay: Payer: PPO

## 2021-08-02 ENCOUNTER — Ambulatory Visit: Payer: PPO | Admitting: Dermatology

## 2021-08-24 ENCOUNTER — Inpatient Hospital Stay: Payer: PPO | Admitting: Family

## 2021-08-24 ENCOUNTER — Inpatient Hospital Stay: Payer: PPO

## 2021-08-31 ENCOUNTER — Inpatient Hospital Stay: Payer: PPO

## 2021-08-31 ENCOUNTER — Inpatient Hospital Stay: Payer: PPO | Admitting: Family

## 2021-09-01 ENCOUNTER — Ambulatory Visit: Payer: PPO | Admitting: Dermatology

## 2021-09-03 ENCOUNTER — Inpatient Hospital Stay: Payer: PPO | Attending: Hematology & Oncology

## 2021-09-03 ENCOUNTER — Inpatient Hospital Stay (HOSPITAL_BASED_OUTPATIENT_CLINIC_OR_DEPARTMENT_OTHER): Payer: PPO | Admitting: Family

## 2021-09-03 ENCOUNTER — Other Ambulatory Visit: Payer: Self-pay

## 2021-09-03 ENCOUNTER — Encounter: Payer: Self-pay | Admitting: Family

## 2021-09-03 VITALS — BP 113/86 | HR 86 | Temp 99.0°F | Resp 18 | Ht 62.0 in | Wt 146.8 lb

## 2021-09-03 DIAGNOSIS — D500 Iron deficiency anemia secondary to blood loss (chronic): Secondary | ICD-10-CM | POA: Diagnosis present

## 2021-09-03 DIAGNOSIS — N920 Excessive and frequent menstruation with regular cycle: Secondary | ICD-10-CM | POA: Insufficient documentation

## 2021-09-03 DIAGNOSIS — K909 Intestinal malabsorption, unspecified: Secondary | ICD-10-CM | POA: Diagnosis not present

## 2021-09-03 DIAGNOSIS — D508 Other iron deficiency anemias: Secondary | ICD-10-CM

## 2021-09-03 LAB — RETICULOCYTES
Immature Retic Fract: 8.8 % (ref 2.3–15.9)
RBC.: 4.36 MIL/uL (ref 3.87–5.11)
Retic Count, Absolute: 48 10*3/uL (ref 19.0–186.0)
Retic Ct Pct: 1.1 % (ref 0.4–3.1)

## 2021-09-03 LAB — CBC WITH DIFFERENTIAL (CANCER CENTER ONLY)
Abs Immature Granulocytes: 0 10*3/uL (ref 0.00–0.07)
Basophils Absolute: 0 10*3/uL (ref 0.0–0.1)
Basophils Relative: 1 %
Eosinophils Absolute: 0.1 10*3/uL (ref 0.0–0.5)
Eosinophils Relative: 4 %
HCT: 39.6 % (ref 36.0–46.0)
Hemoglobin: 13.1 g/dL (ref 12.0–15.0)
Immature Granulocytes: 0 %
Lymphocytes Relative: 51 %
Lymphs Abs: 1.9 10*3/uL (ref 0.7–4.0)
MCH: 30.3 pg (ref 26.0–34.0)
MCHC: 33.1 g/dL (ref 30.0–36.0)
MCV: 91.5 fL (ref 80.0–100.0)
Monocytes Absolute: 0.2 10*3/uL (ref 0.1–1.0)
Monocytes Relative: 5 %
Neutro Abs: 1.5 10*3/uL — ABNORMAL LOW (ref 1.7–7.7)
Neutrophils Relative %: 39 %
Platelet Count: 233 10*3/uL (ref 150–400)
RBC: 4.33 MIL/uL (ref 3.87–5.11)
RDW: 12.5 % (ref 11.5–15.5)
WBC Count: 3.8 10*3/uL — ABNORMAL LOW (ref 4.0–10.5)
nRBC: 0 % (ref 0.0–0.2)

## 2021-09-03 NOTE — Progress Notes (Signed)
?Hematology and Oncology Follow Up Visit ? ?Angela Jimenez ?[id number] ?23-Sep-1970 51 y.o. ?09/03/2021 ? ? ?Principle Diagnosis:  ?Iron deficiency anemia secondary to malabsorption and heavy cycles ?  ?Current Therapy:        ?Iv iron as indicated  ?  ?Interim History:  Angela Jimenez is here today for follow-up. She is doing well but states that she still gets fatigued at times.  ?She was able to move out of her house and sell it. She is now living with her sister and feeling better.  ?No blood loss, bruising or petechiae.  ?No fever, chills, n/v, cough, rash, dizziness, SOB, chest pain, palpitations, abdominal pain or changes in bowel or bladder habits.  ?No swelling in her extremities at this time.  ?She has numbness and tingling in her arms and legs she states due to chronic nerve issues in the neck and pelvis. She is in PT twice a week for 6 weeks.  ?No falls or syncope reported.  ?She has been eating a healthy diet and is doing her best to stay well hydrated. Her weight is stable at 146 lbs.  ? ?ECOG Performance Status: 1 - Symptomatic but completely ambulatory ? ?Medications:  ?Allergies as of 09/03/2021   ? ?   Reactions  ? Amoxicillin Anaphylaxis  ? Ciprofloxacin Anaphylaxis  ?   ? Molds & Smuts Hives, Shortness Of Breath  ? Other Anaphylaxis, Hives, Other (See Comments)  ? Tree nuts, anaphylaxis and migraine headache  ? Peanut-containing Drug Products Anaphylaxis  ? Penicillins Anaphylaxis  ? Propoxyphene Nausea And Vomiting  ? Sulfa Antibiotics Other (See Comments)  ? ASTHMA ATTACK  ? Wheat Bran   ? Wool Alcohol [lanolin] Anaphylaxis  ? Migraines, asthma, GI upset  ? Corn-containing Products Other (See Comments)  ? Bloated and gassy  ? Latex Hives  ? Prednisone Other (See Comments)  ? "Makes me numb and tingly" increases eye pressure.   ? Peanut (diagnostic) Other (See Comments)  ? ?  ? ?  ?Medication List  ?  ? ?  ? Accurate as of September 03, 2021  2:56 PM. If you have any questions, ask your nurse or doctor.  ?   ?  ? ?  ? ?albuterol 108 (90 Base) MCG/ACT inhaler ?Commonly known as: ProAir HFA ?Inhale 2 puffs into the lungs every 4 (four) hours as needed (for coughing or wheezing spells). ?  ?albuterol 108 (90 Base) MCG/ACT inhaler ?Commonly known as: VENTOLIN HFA ?INHALE 2 PUFFS INTO THE LUNGS EVERY 4 HOURS AS NEEDED FOR WHEEZING OR SHORTNESS OF BREATH ?  ?albuterol (2.5 MG/3ML) 0.083% nebulizer solution ?Commonly known as: PROVENTIL ?Take 3 mLs (2.5 mg total) by nebulization every 6 (six) hours as needed for wheezing or shortness of breath. ?  ?buPROPion 150 MG 24 hr tablet ?Commonly known as: WELLBUTRIN XL ?Take by mouth. ?  ?buPROPion 300 MG 24 hr tablet ?Commonly known as: WELLBUTRIN XL ?Take 300 mg by mouth every morning. ?  ?clonazePAM 0.5 MG tablet ?Commonly known as: KLONOPIN ?Take 0.5 mg by mouth 2 (two) times daily as needed (for back pain). ?  ?COLLAGEN PO ?Take 10 g by mouth daily. ?  ?cyclobenzaprine 10 MG tablet ?Commonly known as: FLEXERIL ?Take 10 mg by mouth as needed for muscle spasms. ?  ?diphenhydrAMINE 25 MG tablet ?Commonly known as: BENADRYL ?Take 25 mg by mouth every 4 (four) hours as needed. ?  ?diphenhydrAMINE 25 MG tablet ?Commonly known as: Webb ?Take by mouth. ?  ?EPINEPHrine  0.3 mg/0.3 mL Soaj injection ?Commonly known as: EPI-PEN ?Use as directed for severe allergic reaction. ?  ?GOODY HEADACHE PO ?Take by mouth as needed. ?  ?guanFACINE 1 MG tablet ?Commonly known as: TENEX ?Take by mouth. ?  ?HYDROcodone-acetaminophen 5-325 MG tablet ?Commonly known as: NORCO/VICODIN ?Take 0.5-1 tablets by mouth 2 (two) times daily as needed. ?  ?IBgard 90 MG Cpcr ?Generic drug: Peppermint Oil ?  ?linaclotide 72 MCG capsule ?Commonly known as: Linzess ?Take 1 capsule (72 mcg total) by mouth daily before breakfast. ?  ?loratadine 10 MG tablet ?Commonly known as: CLARITIN ?Take 10 mg by mouth daily. ?  ?montelukast 10 MG tablet ?Commonly known as: SINGULAIR ?Take 1 tablet (10 mg total) by mouth at  bedtime. ?  ?MSM PO ?Take 1,000 mg by mouth daily. ?  ?MULTIVITAMIN ADULT PO ?Take 3 tablets by mouth daily. ?  ?NON FORMULARY ?Take 6-10 drops by mouth daily at 6 (six) AM. "Harmony" crystal/oxygenated water ?  ?Olopatadine HCl 0.2 % Soln ?One drop each eye once a day as needed for itchy eyes. ?  ?omeprazole 20 MG capsule ?Commonly known as: PRILOSEC ?Take 1 capsule (20 mg total) by mouth 2 (two) times daily. ?  ?OVER THE COUNTER MEDICATION ?Mushroon Matcha Drink mix daily ?  ?OVER THE COUNTER MEDICATION ?Matcha powder mix in food or water and drink once a day ?  ?OVER THE COUNTER MEDICATION ?Sovereign Silver immune support take 6 droppers full once a day ?  ?pantoprazole 40 MG tablet ?Commonly known as: PROTONIX ?Take 1 tablet (40 mg total) by mouth daily. ?  ?polyethylene glycol powder 17 GM/SCOOP powder ?Commonly known as: GLYCOLAX/MIRALAX ?  ?pseudoephedrine 30 MG tablet ?Commonly known as: SUDAFED ?Take 30 mg by mouth every 4 (four) hours as needed for congestion. ?  ?Spiriva Respimat 1.25 MCG/ACT Aers ?Generic drug: Tiotropium Bromide Monohydrate ?Inhale 2 puffs into the lungs daily. ?  ?traMADol 50 MG tablet ?Commonly known as: ULTRAM ?  ?Trelegy Ellipta 200-62.5-25 MCG/ACT Aepb ?Generic drug: Fluticasone-Umeclidin-Vilant ?INHALE 1 PUFF BY MOUTH DAILY ?  ?tretinoin 0.025 % cream ?Commonly known as: RETIN-A ?Apply topically at bedtime. As directed ?  ?triamcinolone 55 MCG/ACT Aero nasal inhaler ?Commonly known as: NASACORT ?One spray each nostril twice a day as needed ?  ?zafirlukast 20 MG tablet ?Commonly known as: ACCOLATE ?Take 20 mg by mouth 2 (two) times daily. ?  ? ?  ? ? ?Allergies:  ?Allergies  ?Allergen Reactions  ? Amoxicillin Anaphylaxis  ? Ciprofloxacin Anaphylaxis  ?   ?  ? Molds & Smuts Hives and Shortness Of Breath  ? Other Anaphylaxis, Hives and Other (See Comments)  ?  Tree nuts, anaphylaxis and migraine headache  ? Peanut-Containing Drug Products Anaphylaxis  ? Penicillins Anaphylaxis  ?  Propoxyphene Nausea And Vomiting  ? Sulfa Antibiotics Other (See Comments)  ?  ASTHMA ATTACK  ? Wheat Bran   ? Wool Alcohol [Lanolin] Anaphylaxis  ?  Migraines, asthma, GI upset  ? Corn-Containing Products Other (See Comments)  ?  Bloated and gassy  ? Latex Hives  ? Prednisone Other (See Comments)  ?  "Makes me numb and tingly" increases eye pressure.   ? Peanut (Diagnostic) Other (See Comments)  ? ? ?Past Medical History, Surgical history, Social history, and Family History were reviewed and updated. ? ?Review of Systems: ?All other 10 point review of systems is negative.  ? ?Physical Exam: ? vitals were not taken for this visit.  ? ?Wt Readings from Last 3  Encounters:  ?04/27/21 145 lb 1.9 oz (65.8 kg)  ?01/05/21 153 lb 12.8 oz (69.8 kg)  ?11/26/20 156 lb 6 oz (70.9 kg)  ? ? ?Ocular: Sclerae unicteric, pupils equal, round and reactive to light ?Ear-nose-throat: Oropharynx clear, dentition fair ?Lymphatic: No cervical or supraclavicular adenopathy ?Lungs no rales or rhonchi, good excursion bilaterally ?Heart regular rate and rhythm, no murmur appreciated ?Abd soft, nontender, positive bowel sounds ?MSK no focal spinal tenderness, no joint edema ?Neuro: non-focal, well-oriented, appropriate affect ?Breasts: Deferred  ? ?Lab Results  ?Component Value Date  ? WBC 3.8 (L) 09/03/2021  ? HGB 13.1 09/03/2021  ? HCT 39.6 09/03/2021  ? MCV 91.5 09/03/2021  ? PLT 233 09/03/2021  ? ?Lab Results  ?Component Value Date  ? FERRITIN 103 04/27/2021  ? IRON 97 04/27/2021  ? TIBC 316 04/27/2021  ? UIBC 219 04/27/2021  ? IRONPCTSAT 31 04/27/2021  ? ?Lab Results  ?Component Value Date  ? RETICCTPCT 1.1 09/03/2021  ? RBC 4.36 09/03/2021  ? ?No results found for: KPAFRELGTCHN, LAMBDASER, KAPLAMBRATIO ?No results found for: IGGSERUM, IGA, IGMSERUM ?No results found for: TOTALPROTELP, ALBUMINELP, A1GS, A2GS, BETS, BETA2SER, GAMS, MSPIKE, SPEI ?  Chemistry   ?   ?Component Value Date/Time  ? NA 138 09/29/2020 1323  ? K 3.7 09/29/2020 1323   ? CL 100 09/29/2020 1323  ? CO2 32 09/29/2020 1323  ? BUN 10 09/29/2020 1323  ? CREATININE 1.08 (H) 09/29/2020 1323  ?    ?Component Value Date/Time  ? CALCIUM 9.4 09/29/2020 1323  ? ALKPHOS 47 09/29/2020 1323

## 2021-09-06 ENCOUNTER — Encounter: Payer: Self-pay | Admitting: *Deleted

## 2021-09-06 LAB — IRON AND IRON BINDING CAPACITY (CC-WL,HP ONLY)
Iron: 104 ug/dL (ref 28–170)
Saturation Ratios: 31 % (ref 10.4–31.8)
TIBC: 339 ug/dL (ref 250–450)
UIBC: 235 ug/dL (ref 148–442)

## 2021-09-06 LAB — FERRITIN: Ferritin: 69 ng/mL (ref 11–307)

## 2021-09-29 ENCOUNTER — Ambulatory Visit (INDEPENDENT_AMBULATORY_CARE_PROVIDER_SITE_OTHER): Payer: PPO | Admitting: Dermatology

## 2021-09-29 DIAGNOSIS — L700 Acne vulgaris: Secondary | ICD-10-CM | POA: Diagnosis not present

## 2021-09-29 DIAGNOSIS — L814 Other melanin hyperpigmentation: Secondary | ICD-10-CM

## 2021-09-29 DIAGNOSIS — L819 Disorder of pigmentation, unspecified: Secondary | ICD-10-CM

## 2021-09-29 DIAGNOSIS — L818 Other specified disorders of pigmentation: Secondary | ICD-10-CM

## 2021-09-29 NOTE — Progress Notes (Signed)
   Follow-Up Visit   Subjective  Angela Jimenez is a 51 y.o. female who presents for the following: Acne (With scarring, face, not using Retin A 0.025% uses otc retinol oil) and Postinflammator hyperpigmentaton (L upper arm, Retin A 0.025% prn).  The following portions of the chart were reviewed this encounter and updated as appropriate:   Tobacco  Allergies  Meds  Problems  Med Hx  Surg Hx  Fam Hx     Review of Systems:  No other skin or systemic complaints except as noted in HPI or Assessment and Plan.  Objective  Well appearing patient in no apparent distress; mood and affect are within normal limits.  A focused examination was performed including face, left upper arm. Relevant physical exam findings are noted in the Assessment and Plan.  face Scarring temples, few comedones face         L upper arm Linear hyperpigmented macule L upper arm   Assessment & Plan   Seborrheic Keratoses - Stuck-on, waxy, tan-brown papules and/or plaques  - Benign-appearing - Discussed benign etiology and prognosis. - Observe - Call for any changes - Discussed ED, chemical peel, BBL  Acne vulgaris with scarring and spotty macular hyperpigmentation/dyschromia face Chronic and persistent condition with duration or expected duration over one year. Condition is symptomatic / bothersome to patient. Not to goal.   Recommend restarting Retin A 0.025% cr qhs to face as tolerated   Discussed chemical peels, BBL, if pt would like to do chemical peel recommend her f/u with Dr. Laurence Ferrari in the fall since she has very sensitive skin  Post-inflammatory pigmentary changes L upper arm Improved Cont Retin A 0.025% cr increasing to qhs until clear  Return for to be scheduled with Dr. Laurence Ferrari in the fall to discuss chemical peel.  I, Othelia Pulling, RMA, am acting as scribe for Sarina Ser, MD . Documentation: I have reviewed the above documentation for accuracy and completeness, and I agree with  the above.  Sarina Ser, MD

## 2021-09-29 NOTE — Patient Instructions (Addendum)
Seborrheic Keratosis ? ?What causes seborrheic keratoses? ?Seborrheic keratoses are harmless, common skin growths that first appear during adult life.  As time goes by, more growths appear.  Some people may develop a large number of them.  Seborrheic keratoses appear on both covered and uncovered body parts.  They are not caused by sunlight.  The tendency to develop seborrheic keratoses can be inherited.  They vary in color from skin-colored to gray, brown, or even black.  They can be either smooth or have a rough, warty surface.   ?Seborrheic keratoses are superficial and look as if they were stuck on the skin.  Under the microscope this type of keratosis looks like layers upon layers of skin.  That is why at times the top layer may seem to fall off, but the rest of the growth remains and re-grows.   ? ?Treatment ?Seborrheic keratoses do not need to be treated, but can easily be removed in the office.  Seborrheic keratoses often cause symptoms when they rub on clothing or jewelry.  Lesions can be in the way of shaving.  If they become inflamed, they can cause itching, soreness, or burning.  Removal of a seborrheic keratosis can be accomplished by freezing, burning, or surgery. ?If any spot bleeds, scabs, or grows rapidly, please return to have it checked, as these can be an indication of a skin cancer. ? ? ? ? ? ?If You Need Anything After Your Visit ? ?If you have any questions or concerns for your doctor, please call our main line at [phone] and press option 4 to reach your doctor's medical assistant. If no one answers, please leave a voicemail as directed and we will return your call as soon as possible. Messages left after 4 pm will be answered the following business day.  ? ?You may also send [place] a message via MyChart. We typically respond to MyChart messages within 1-2 business days. ? ?For prescription refills, please ask your pharmacy to contact our office. Our fax number is [phone]. ? ?If you have  an urgent issue when the clinic is closed that cannot wait until the next business day, you can page your doctor at the number below.   ? ?Please note that while we do our best to be available for urgent issues outside of office hours, we are not available 24/7.  ? ?If you have an urgent issue and are unable to reach [place], you may choose to seek medical care at your doctor's office, retail clinic, urgent care center, or emergency room. ? ?If you have a medical emergency, please immediately call 911 or go to the emergency department. ? ?Pager Numbers ? ?- Dr. Nehemiah Massed: [phone] ? ?- Dr. Laurence Ferrari: [phone] ? ?- Dr. Nicole Kindred: [phone] ? ?In the event of inclement weather, please call our main line at [phone] for an update on the status of any delays or closures. ? ?Dermatology Medication Tips: ?Please keep the boxes that topical medications come in in order to help keep track of the instructions about where and how to use these. Pharmacies typically print the medication instructions only on the boxes and not directly on the medication tubes.  ? ?If your medication is too expensive, please contact our office at [phone] option 4 or send [place] a message through [place].  ? ?We are unable to tell what your co-pay for medications will be in advance as this is different depending on your insurance coverage. However, we may be able to find a substitute medication  at lower cost or fill out paperwork to get insurance to cover a needed medication.  ? ?If a prior authorization is required to get your medication covered by your insurance company, please allow [place] 1-2 business days to complete this process. ? ?Drug prices often vary depending on where the prescription is filled and some pharmacies may offer cheaper prices. ? ?The website www.goodrx.com contains coupons for medications through different pharmacies. The prices here do not account for what the cost may be with help from insurance (it may be cheaper with  your insurance), but the website can give you the price if you did not use any insurance.  ?- You can print the associated coupon and take it with your prescription to the pharmacy.  ?- You may also stop by our office during regular business hours and pick up a GoodRx coupon card.  ?- If you need your prescription sent electronically to a different pharmacy, notify our office through [facility] or by phone at [phone] option 4. ? ? ? ? ?Si Usted Necesita Algo Despu?s de Su Visita ? ?Tambi?n puede enviarnos un mensaje a trav?s de MyChart. Por lo general respondemos a los mensajes de MyChart en el transcurso de 1 a 2 d?as h?biles. ? ?Para renovar recetas, por favor pida a su farmacia que se ponga en contacto con nuestra oficina. Nuestro n?mero de fax es el [phone]. ? ?Si tiene un asunto urgente cuando la cl?nica est? cerrada y que no puede esperar hasta el siguiente d?a h?bil, puede llamar/localizar a su doctor(a) al n?mero que aparece a continuaci?n.  ? ?Por favor, tenga en cuenta que aunque hacemos todo lo posible para estar disponibles para asuntos urgentes fuera del horario de oficina, no estamos disponibles las 24 horas del d?a, los 7 d?as de la semana.  ? ?Si tiene un problema urgente y no puede comunicarse con nosotros, puede optar por buscar atenci?n m?dica  en el consultorio de su doctor(a), en una cl?nica privada, en un centro de atenci?n urgente o en una sala de emergencias. ? ?Si tiene Engineer, maintenance (IT) m?dica, por favor llame inmediatamente al 911 o vaya a la sala de emergencias. ? ?N?meros de b?per ? ?- Dr. Nehemiah Massed: [phone] ? ?- Dra. Moye: [phone] ? ?- Dra. Nicole Kindred: [phone] ? ?En caso de inclemencias del tiempo, por favor llame a nuestra l?nea principal al [phone] para una actualizaci?n sobre el estado de cualquier retraso o cierre. ? ?Consejos para la medicaci?n en dermatolog?a: ?Por favor, guarde las cajas en las que vienen los medicamentos de uso t?pico para  ayudarle a seguir las instrucciones sobre d?nde y c?mo usarlos. Las farmacias generalmente imprimen las instrucciones del medicamento s?lo en las cajas y no directamente en los tubos del [place].  ? ?Si su medicamento es muy caro, por favor, p?ngase en contacto con Zigmund Daniel llamando al [phone] y presione la opci?n 4 o env?enos un mensaje a trav?s de MyChart.  ? ?No podemos decirle cu?l ser? su copago por los medicamentos por adelantado ya que esto es diferente dependiendo de la cobertura de su seguro. Sin embargo, es posible que podamos encontrar un medicamento sustituto a Electrical engineer un formulario para que el seguro cubra el medicamento que se considera necesario.  ? ?Si se requiere Ardelia Mems autorizaci?n previa para que su compa??a de seguros [place] su medicamento, por favor perm?tanos de 1 a 2 d?as h?biles para completar este proceso. ? ?Los precios de los medicamentos var?an con frecuencia dependiendo del Environmental consultant de d?nde  se surte la receta y alguna farmacias pueden ofrecer precios m?s baratos. ? ?El sitio web www.goodrx.com tiene cupones para medicamentos de Airline pilot. Los precios aqu? no tienen en cuenta lo que podr?a costar con la ayuda del seguro (puede ser m?s barato con su seguro), pero el sitio web puede darle el precio si no utiliz? ning?n seguro.  ?- Puede imprimir el cup?n correspondiente y llevarlo con su receta a la farmacia.  ?- Tambi?n puede pasar por nuestra oficina durante el horario de atenci?n regular y recoger una tarjeta de cupones de GoodRx.  ?- Si necesita que su receta se env?e electr?nicamente a Chiropodist, informe a nuestra oficina a trav?s de MyChart de [place] o por tel?fono llamando al [phone] y presione la opci?n 4.  ?

## 2021-10-13 ENCOUNTER — Encounter: Payer: Self-pay | Admitting: Dermatology

## 2021-11-15 ENCOUNTER — Other Ambulatory Visit: Payer: Self-pay | Admitting: Nurse Practitioner

## 2021-11-15 DIAGNOSIS — N6489 Other specified disorders of breast: Secondary | ICD-10-CM

## 2021-11-15 DIAGNOSIS — Z1231 Encounter for screening mammogram for malignant neoplasm of breast: Secondary | ICD-10-CM

## 2021-11-29 ENCOUNTER — Inpatient Hospital Stay
Admission: RE | Admit: 2021-11-29 | Discharge: 2021-11-29 | Disposition: A | Discharge status: Home / Self Care | Payer: Self-pay | Source: Ambulatory Visit | Attending: *Deleted | Admitting: *Deleted

## 2021-11-29 ENCOUNTER — Other Ambulatory Visit: Payer: Self-pay | Admitting: *Deleted

## 2021-11-29 DIAGNOSIS — Z1231 Encounter for screening mammogram for malignant neoplasm of breast: Secondary | ICD-10-CM

## 2021-12-16 ENCOUNTER — Ambulatory Visit
Admission: RE | Admit: 2021-12-16 | Discharge: 2021-12-16 | Disposition: A | Discharge status: Home / Self Care | Payer: PPO | Source: Ambulatory Visit | Attending: Nurse Practitioner | Admitting: Nurse Practitioner

## 2021-12-16 DIAGNOSIS — N6489 Other specified disorders of breast: Secondary | ICD-10-CM | POA: Insufficient documentation

## 2022-01-03 ENCOUNTER — Inpatient Hospital Stay: Payer: PPO | Admitting: Family

## 2022-01-03 ENCOUNTER — Inpatient Hospital Stay: Payer: PPO | Attending: Hematology & Oncology

## 2022-02-17 ENCOUNTER — Telehealth: Payer: Self-pay

## 2022-02-17 NOTE — Telephone Encounter (Signed)
Patient is requesting a refill on Linzess 105mg patient has not been seen In over a year. Sent mychart message she needed a appointment

## 2022-02-24 ENCOUNTER — Ambulatory Visit: Payer: PPO | Admitting: Dermatology

## 2022-03-15 ENCOUNTER — Ambulatory Visit (INDEPENDENT_AMBULATORY_CARE_PROVIDER_SITE_OTHER): Payer: PPO | Admitting: Dermatology

## 2022-03-15 DIAGNOSIS — L814 Other melanin hyperpigmentation: Secondary | ICD-10-CM

## 2022-03-15 DIAGNOSIS — D239 Other benign neoplasm of skin, unspecified: Secondary | ICD-10-CM | POA: Diagnosis not present

## 2022-03-15 DIAGNOSIS — L821 Other seborrheic keratosis: Secondary | ICD-10-CM | POA: Diagnosis not present

## 2022-03-15 MED ORDER — VALACYCLOVIR HCL 500 MG PO TABS: 500.0000 mg | ORAL_TABLET | Freq: Two times a day (BID) | ORAL | 2 refills | Status: AC

## 2022-03-15 NOTE — Progress Notes (Addendum)
   Follow-Up Visit   Subjective  Angela Jimenez is a 51 y.o. female who presents for the following: Skin Problem (Patient here today to discuss facial peels, concerned about brown spots and large pores. ).  The following portions of the chart were reviewed this encounter and updated as appropriate:   Tobacco  Allergies  Meds  Problems  Med Hx  Surg Hx  Fam Hx      Review of Systems:  No other skin or systemic complaints except as noted in HPI or Assessment and Plan.  Objective  Well appearing patient in no apparent distress; mood and affect are within normal limits.  A focused examination was performed including face. Relevant physical exam findings are noted in the Assessment and Plan.  face Stuck-on, waxy, tan-brown papules and plaques and scattered tan macules.            Assessment & Plan  Seborrheic keratosis face  With lentigines   Recommend The Perfect Derma Peel,  Patient advised for best results recommend doing 2-3 peels 4-6 weeks apart.   No hx of cold sores. Will send in valacyclovir 500 mg to start 12-24 hours prior to peel taking twice daily for 8 days with a big glass of water.   Discussed risks of temporary darkening, can send in [facility] 2.5% cream to use following peel to help with irritation.   valACYclovir (VALTREX) 500 MG tablet - face Take 1 tablet (500 mg total) by mouth 2 (two) times daily for 8 days. Start 12-24 hours prior to peel and take as directed.  Dermatofibroma - Firm pink/brown papulenodule with dimple sign - Benign appearing - Call for any changes  Return for as scheduled.  Graciella Belton, RMA, am acting as scribe for Forest Gleason, MD .  Documentation: I have reviewed the above documentation for accuracy and completeness, and I agree with the above.  Forest Gleason, MD

## 2022-03-15 NOTE — Patient Instructions (Signed)
Will send in valacyclovir 500 mg to start 12-24 hours prior to peel taking twice daily for 8 days with a big glass of water.   Due to recent changes in healthcare laws, you may see results of your pathology and/or laboratory studies on MyChart before the doctors have had a chance to review them. We understand that in some cases there may be results that are confusing or concerning to you. Please understand that not all results are received at the same time and often the doctors may need to interpret multiple results in order to provide you with the best plan of care or course of treatment. Therefore, we ask that you please give [place] 2 business days to thoroughly review all your results before contacting the office for clarification. Should we see a critical lab result, you will be contacted sooner.   If You Need Anything After Your Visit  If you have any questions or concerns for your doctor, please call our main line at [phone] and press option 4 to reach your doctor's medical assistant. If no one answers, please leave a voicemail as directed and we will return your call as soon as possible. Messages left after 4 pm will be answered the following business day.   You may also send [place] a message via [place]. We typically respond to MyChart messages within 1-2 business days.  For prescription refills, please ask your pharmacy to contact our office. Our fax number is [phone].  If you have an urgent issue when the clinic is closed that cannot wait until the next business day, you can page your doctor at the number below.    Please note that while we do our best to be available for urgent issues outside of office hours, we are not available 24/7.   If you have an urgent issue and are unable to reach [place], you may choose to seek medical care at your doctor's office, retail clinic, urgent care center, or emergency room.  If you have a medical emergency, please immediately call 911 or go to the  emergency department.  Pager Numbers  - Dr. Nehemiah Massed: [phone]  - Dr. Laurence Ferrari: [phone]  - Dr. Nicole Kindred: [phone]  In the event of inclement weather, please call our main line at [phone] for an update on the status of any delays or closures.  Dermatology Medication Tips: Please keep the boxes that topical medications come in in order to help keep track of the instructions about where and how to use these. Pharmacies typically print the medication instructions only on the boxes and not directly on the medication tubes.   If your medication is too expensive, please contact our office at [phone] option 4 or send [place] a message through [place].   We are unable to tell what your co-pay for medications will be in advance as this is different depending on your insurance coverage. However, we may be able to find a substitute medication at lower cost or fill out paperwork to get insurance to cover a needed medication.   If a prior authorization is required to get your medication covered by your insurance company, please allow [place] 1-2 business days to complete this process.  Drug prices often vary depending on where the prescription is filled and some pharmacies may offer cheaper prices.  The website www.goodrx.com contains coupons for medications through different pharmacies. The prices here do not account for what the cost may be with help from insurance (it may be cheaper with your insurance),  but the website can give you the price if you did not use any insurance.  - You can print the associated coupon and take it with your prescription to the pharmacy.  - You may also stop by our office during regular business hours and pick up a GoodRx coupon card.  - If you need your prescription sent electronically to a different pharmacy, notify our office through [facility] or by phone at [phone] option 4.     Si Usted Necesita Algo Despus de Su Visita  Tambin puede  enviarnos un mensaje a travs de Pharmacist, community. Por lo general respondemos a los mensajes de MyChart en el transcurso de 1 a 2 das hbiles.  Para renovar recetas, por favor pida a su farmacia que se ponga en contacto con nuestra oficina. Harland Dingwall de fax es [place] [phone].  Si tiene un asunto urgente cuando la clnica est cerrada y que no puede esperar hasta el siguiente da hbil, puede llamar/localizar a su doctor(a) al nmero que aparece a continuacin.   Por favor, tenga en cuenta que aunque hacemos todo lo posible para estar disponibles para asuntos urgentes fuera del horario de [place], no estamos disponibles las 24 horas del da, los 7 das de la [place].   Si tiene un problema urgente y no puede comunicarse con nosotros, puede optar por buscar atencin mdica  en el consultorio de su doctor(a), en una clnica privada, en un centro de atencin urgente o en una sala de emergencias.  Si tiene Engineering geologist, por favor llame inmediatamente al 911 o vaya a la sala de emergencias.  Nmeros de bper  - Dr. Nehemiah Massed: [phone]  - Dra. Moye: [phone]  - Dra. Nicole Kindred: [phone]  En caso de inclemencias del [place], por favor llame a Johnsie Kindred principal al [phone] para una actualizacin sobre el [place] de cualquier retraso o cierre.  Consejos para la medicacin en dermatologa: Por favor, guarde las cajas en las que vienen los medicamentos de uso tpico para ayudarle a seguir las instrucciones sobre dnde y cmo usarlos. Las farmacias generalmente imprimen las instrucciones del medicamento slo en las cajas y no directamente en los tubos del [place].   Si su medicamento es muy caro, por favor, pngase en contacto con Zigmund Daniel llamando al [phone] y presione la opcin 4 o envenos un mensaje a travs de Pharmacist, community.   No podemos decirle cul ser su copago por los medicamentos por adelantado ya que esto es diferente dependiendo de la cobertura de su seguro.  Sin embargo, es posible que podamos encontrar un medicamento sustituto a Electrical engineer un formulario para que el seguro cubra el medicamento que se considera necesario.   Si se requiere una autorizacin previa para que su compaa de seguros [place] su medicamento, por favor permtanos de 1 a 2 das hbiles para completar este proceso.  Los precios de los medicamentos varan con frecuencia dependiendo del Environmental consultant de dnde se surte la receta y alguna farmacias pueden ofrecer precios ms baratos.  El sitio web www.goodrx.com tiene cupones para medicamentos de Airline pilot. Los precios aqu no tienen en cuenta lo que podra costar con la ayuda del seguro (puede ser ms barato con su seguro), pero el sitio web puede darle el precio si no utiliz Research scientist (physical sciences).  - Puede imprimir el cupn correspondiente y llevarlo con su receta a la farmacia.  - Tambin puede pasar por nuestra oficina durante el horario de atencin regular y Charity fundraiser una tarjeta de cupones  de GoodRx.  - Si necesita que su receta se enve electrnicamente a una farmacia diferente, informe a nuestra oficina a travs de MyChart de [place] o por telfono llamando al [phone] y presione la opcin 4.

## 2022-03-21 ENCOUNTER — Encounter: Payer: Self-pay | Admitting: Dermatology

## 2022-03-31 ENCOUNTER — Ambulatory Visit (INDEPENDENT_AMBULATORY_CARE_PROVIDER_SITE_OTHER): Payer: Self-pay | Admitting: Dermatology

## 2022-03-31 DIAGNOSIS — L821 Other seborrheic keratosis: Secondary | ICD-10-CM

## 2022-03-31 MED ORDER — HYDROCORTISONE 2.5 % EX CREA: TOPICAL_CREAM | Freq: Two times a day (BID) | CUTANEOUS | 0 refills | Status: DC | PRN

## 2022-03-31 NOTE — Progress Notes (Deleted)
   Follow-Up Visit   Subjective  Angela Jimenez is a 51 y.o. female who presents for the following: Seborrheic Keratosis (Patient here today for The Perfect Peel to treat SK's with lentigines. ).  Patient is on day 2 of valacyclovir.   The following portions of the chart were reviewed this encounter and updated as appropriate:       Review of Systems:  No other skin or systemic complaints except as noted in HPI or Assessment and Plan.  Objective  Well appearing patient in no apparent distress; mood and affect are within normal limits.  A focused examination was performed including face. Relevant physical exam findings are noted in the Assessment and Plan.  face Stuck-on, waxy, tan-brown papules and plaques -- Discussed benign etiology and prognosis.            Assessment & Plan  Seborrheic keratosis face  With lentigines   Location: face  Chemical used: The Perfect Derma Peel  Informed consent: Discussed risks (infection, pain, swelling, peeling, blistering, allergic reaction, redness, textural changes of skin, crusting, ulceration, discoloration of skin, undesirable cosmetic result, need for additional treatment, herpes outbreak, and scarring) and benefits of the procedure, as well as the alternatives. Informed consent was obtained.  HSV Prophylaxis: Patient confirms they started valacyclovir prior to the procedure and will continue as directed.  Preparation: The area was cleansed prior to the application of the chemical.  Procedure Details: Prior to applying the peel, the area was cleansed with acetone to remove oils. The chemical was applied using a standard technique. The patient was fanned for comfort.  Plan: The patient was advised to leave the peel on for 6 hours and then wash off with a gentle cleanser and follow with the post-peel moisturizer containing 1% hydrocortisone. She was instructed how to use the post-peel towelettes and sunscreen as described in  the post-peel instruction pamphlet. Patient advised not to pull or pick at peeling skin. Patient will call for any problems including crusting, ulceration, fever, pus, or significant discomfort.  Return to clinic for additional treatment as needed.   Perfect Derma peel performed Lot #[id number] 11/24 HSV prophylaxis needed? Y, started yesterday. Continue valacyclovir 500 mg twice a day as directed Booster used? N Face prepped with acetone Perfect Derma Peel applied with gauze avoiding eyes, nares and mouth Post-Peel Care kit provided and instructions reviewed. Patient advised to leave peel on for 6 hours before washing face. Ok to leave on overnight if not irritated. Need for sun protection after peel reviewed.   Start HC 2.5% cream twice daily for 3-5 days.  Recommend [facility] Hypochlorous Spray (found in the wound care section). The [facility] Hypochlorous Spray can be sprayed on twice daily.  hydrocortisone 2.5 % cream - face Apply topically 2 (two) times daily as needed (Rash). For 3-5 days as needed.   Return in about 6 weeks (around 05/12/2022) for The Perfect Peel.  Graciella Belton, RMA, am acting as scribe for Forest Gleason, MD .

## 2022-04-04 ENCOUNTER — Telehealth: Payer: Self-pay

## 2022-04-04 ENCOUNTER — Encounter: Payer: Self-pay | Admitting: Dermatology

## 2022-04-04 NOTE — Telephone Encounter (Signed)
I recommend vanicream moisturizer but as she is almost a week out, anything gentle such as cetaphil or cerave or eucerin would be fine as well. Thank you!

## 2022-04-04 NOTE — Telephone Encounter (Signed)
Pt called she was given a after care kit after her perfect peel last week, she can't use the moisturizer because it has almond oil, she would like to know what should she use.  

## 2022-04-05 NOTE — Progress Notes (Signed)
   Training Peel   Subjective  Angela Jimenez is a 51 y.o. female who presents for the following: Seborrheic Keratosis (Patient would like to discuss treatment for brown spots at face.).  The following portions of the chart were reviewed this encounter and updated as appropriate:   Tobacco  Allergies  Meds  Problems  Med Hx  Surg Hx  Fam Hx      Review of Systems:  No other skin or systemic complaints except as noted in HPI or Assessment and Plan.  Objective  Well appearing patient in no apparent distress; mood and affect are within normal limits.  A focused examination was performed including face. Relevant physical exam findings are noted in the Assessment and Plan.  face Stuck-on, waxy, tan-brown papule or plaque --Discussed benign etiology and prognosis.     Assessment & Plan  Seborrheic keratosis face  Discussed The Perfect Peel to treat. Will do as a training case with a sample to be sure, no charge.   Location: face  Chemical used: The Perfect Derma Peel  Informed consent: Discussed risks (infection, pain, swelling, peeling, blistering, allergic reaction, redness, textural changes of skin, crusting, ulceration, discoloration of skin, undesirable cosmetic result, need for additional treatment, herpes outbreak, and scarring) and benefits of the procedure, as well as the alternatives. Informed consent was obtained.  HSV Prophylaxis: Patient confirms they started valacyclovir prior to the procedure and will continue as directed.  Preparation: The area was cleansed prior to the application of the chemical.  Procedure Details: Prior to applying the peel, the area was cleansed with acetone to remove oils. The chemical was applied using a standard technique. The patient was fanned for comfort.  Plan: The patient was advised to leave the peel on for 6 hours and then wash off with a gentle cleanser and follow with the post-peel moisturizer containing 1% hydrocortisone.  She was instructed how to use the post-peel towelettes and sunscreen as described in the post-peel instruction pamphlet. Patient advised not to pull or pick at peeling skin. Patient will call for any problems including crusting, ulceration, fever, pus, or significant discomfort.  Return to clinic for additional treatment as needed.  Lot #[id number] 11/24  valacyclovir 500 mg twice a day started yesterday as directed Booster used? N  Related Medications hydrocortisone 2.5 % cream Apply topically 2 (two) times daily as needed (Rash). For 3-5 days as needed.   Return in about 6 weeks (around 05/12/2022) for The Perfect Peel.   Documentation: I have reviewed the above documentation for accuracy and completeness, and I agree with the above.  Forest Gleason, MD

## 2022-04-07 NOTE — Progress Notes (Signed)
   Follow-Up Visit   Subjective  Angela Jimenez is a 51 y.o. female who presents for the following: Seborrheic Keratosis (Patient would like to discuss treatment for brown spots at face.).    The following portions of the chart were reviewed this encounter and updated as appropriate:   Tobacco  Allergies  Meds  Problems  Med Hx  Surg Hx  Fam Hx      Review of Systems:  No other skin or systemic complaints except as noted in HPI or Assessment and Plan.  Objective  Well appearing patient in no apparent distress; mood and affect are within normal limits.  A focused examination was performed including face. Relevant physical exam findings are noted in the Assessment and Plan.  face Stuck-on, waxy, tan-brown papule or plaque --Discussed benign etiology and prognosis.     Assessment & Plan  Seborrheic keratosis face  Discussed The Perfect Peel to treat.   Related Medications hydrocortisone 2.5 % cream Apply topically 2 (two) times daily as needed (Rash). For 3-5 days as needed.   Return in about 6 weeks (around 05/12/2022) for The Perfect Peel.  Graciella Belton, RMA, am acting as scribe for Forest Gleason, MD .

## 2022-06-08 IMAGING — US US ABDOMEN LIMITED
1 series · 14 of 25 positions shown · non-contrast
Comparison: None.

CLINICAL DATA: Reflux and nausea for years.

EXAM:
ULTRASOUND ABDOMEN LIMITED RIGHT UPPER QUADRANT

[Series 1: us abdomen limited · 0.17mm/px · 14 of 53 slices shown]
[im 1/53]
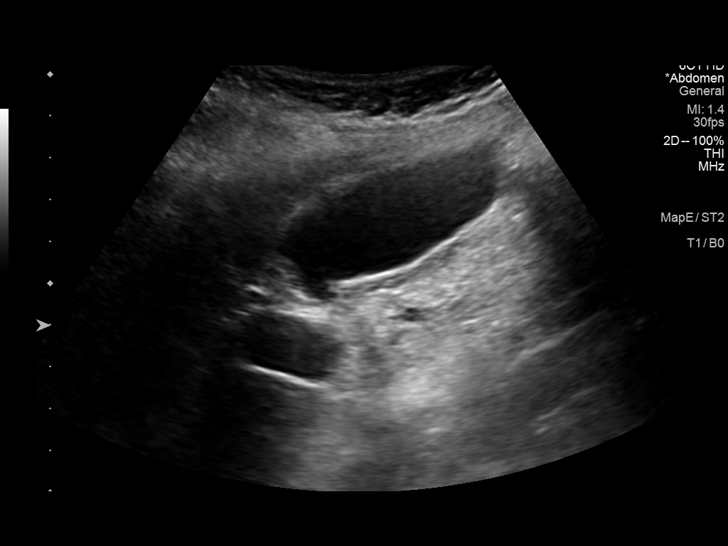
[im 5/53]
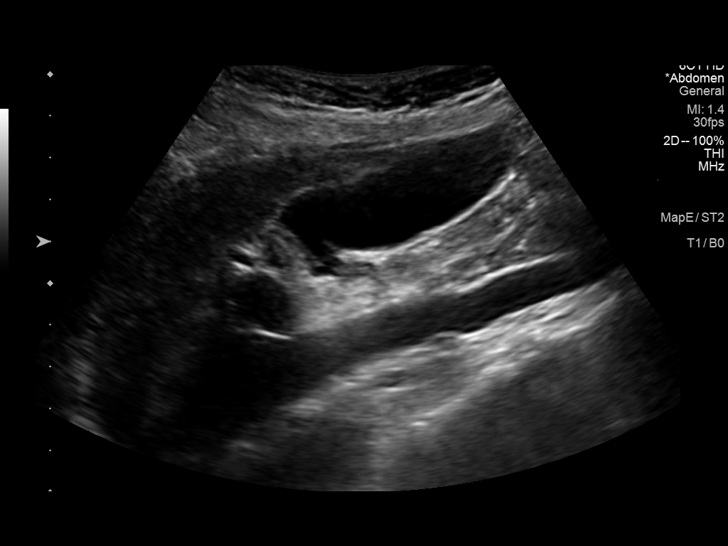
[im 9/53]
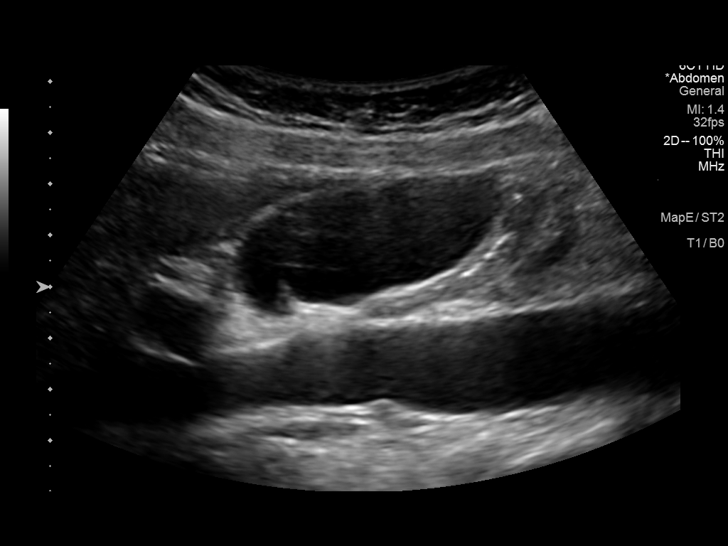
[im 14/53]
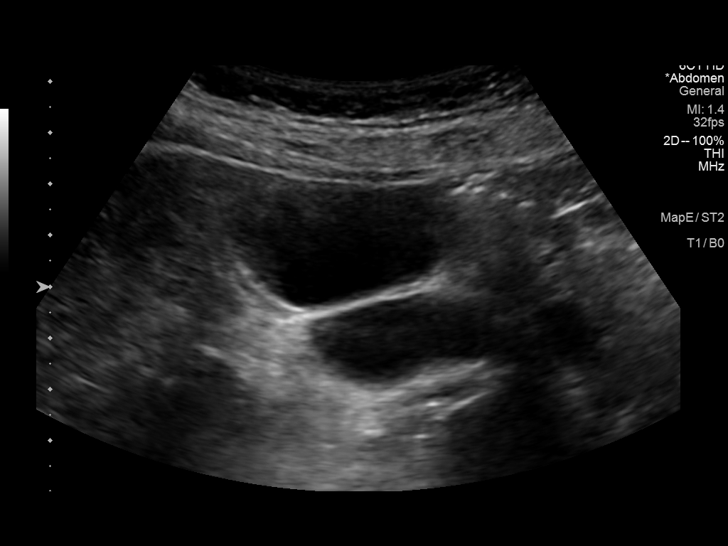
[im 18/53]
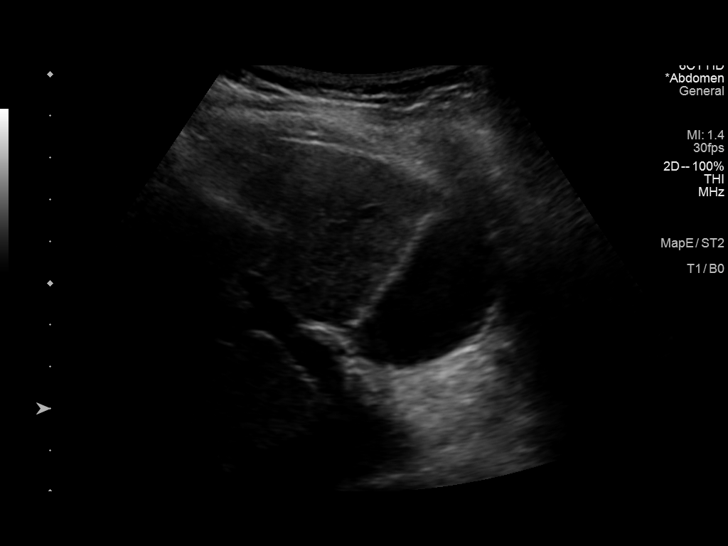
[im 20/53]
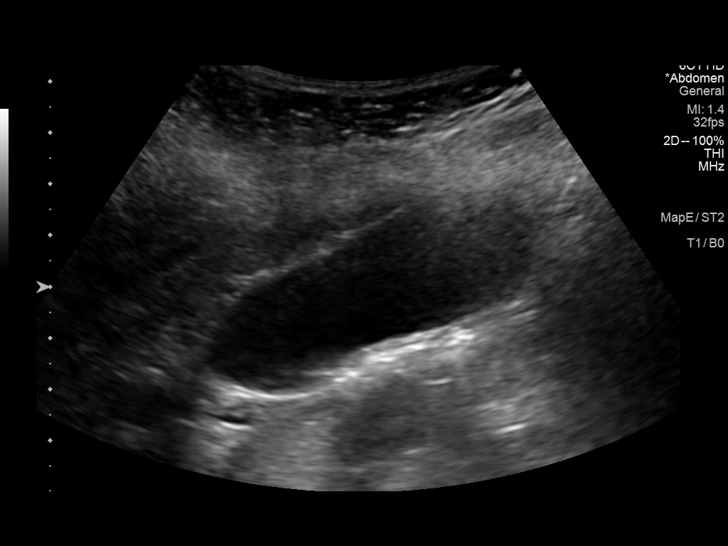
[im 24/53]
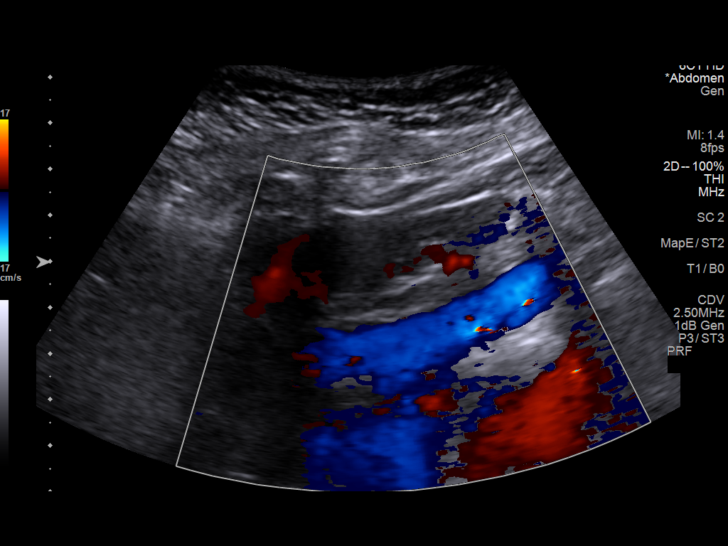
[im 29/53]
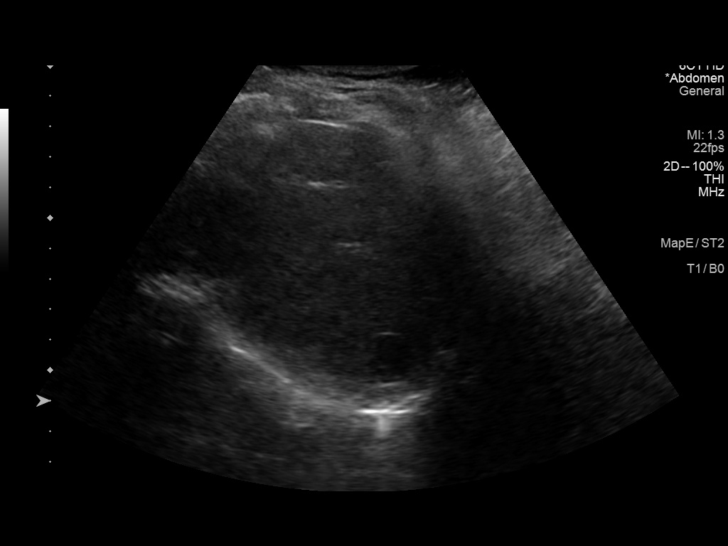
[im 33/53]
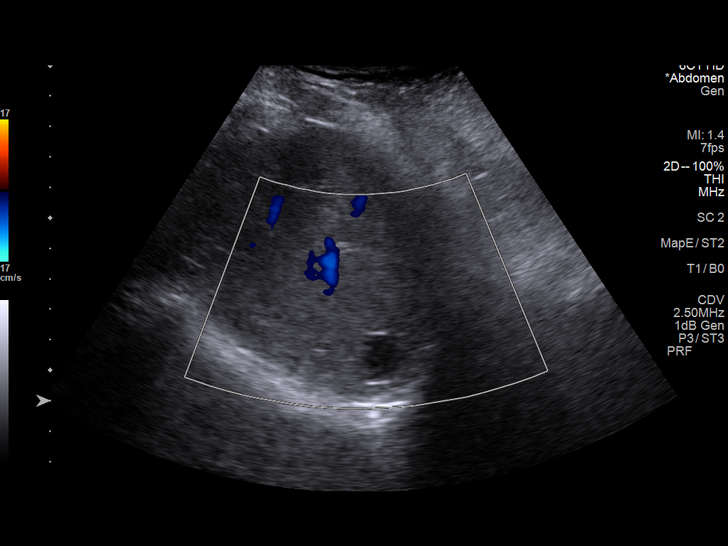
[im 35/53]
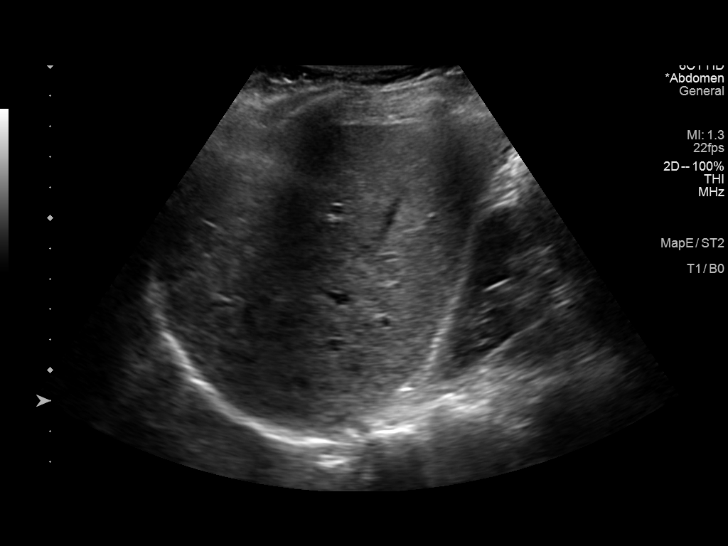
[im 40/53]
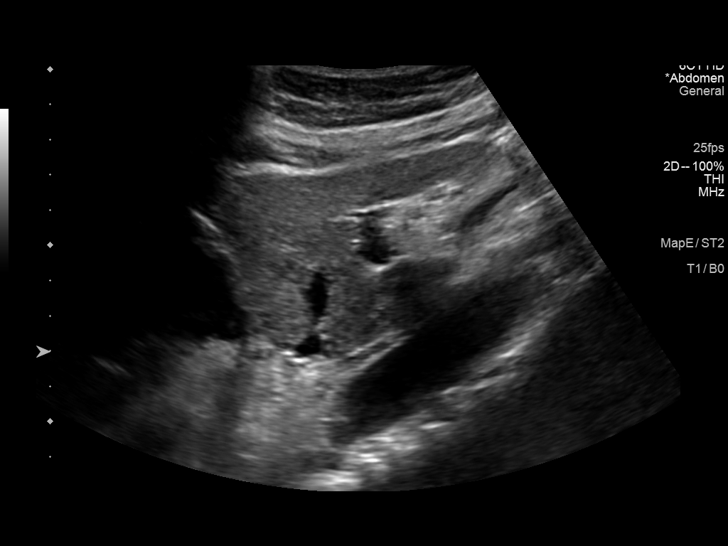
[im 44/53]
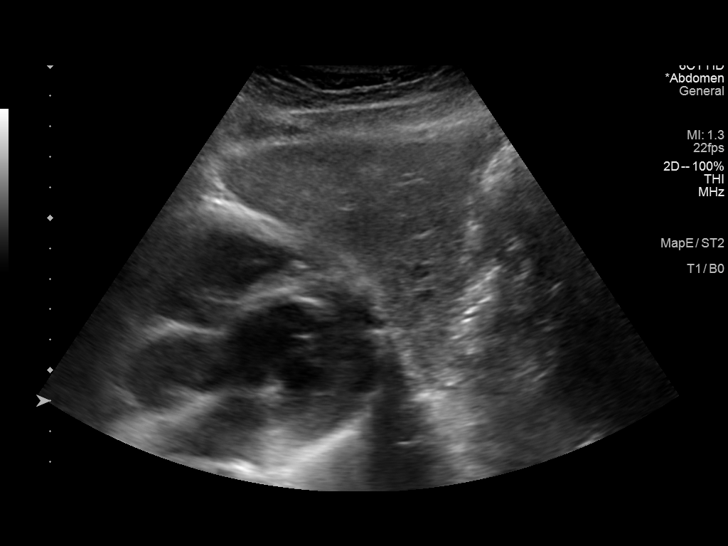
[im 48/53]
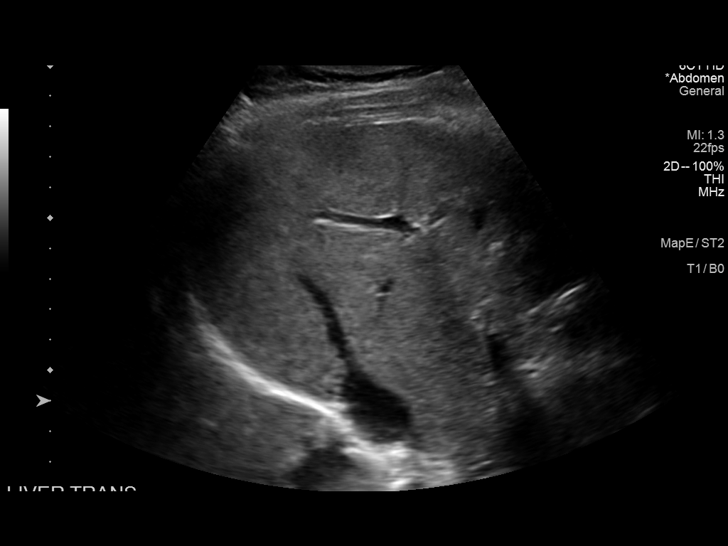
[im 53/53]
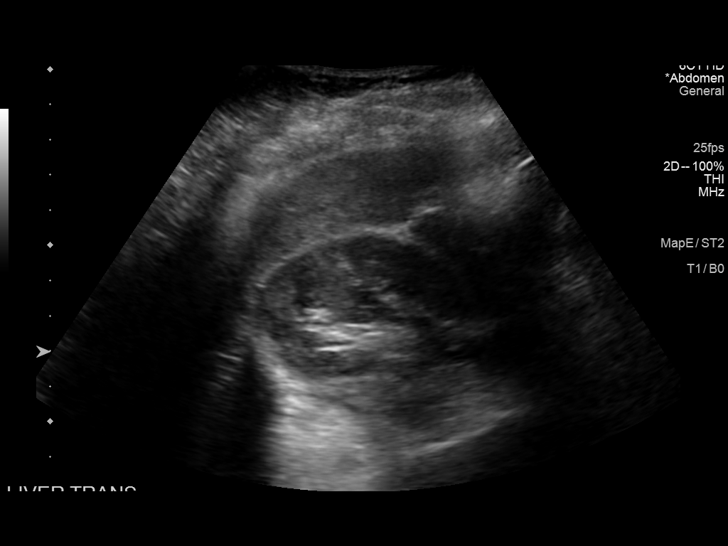

[14 of 25 positions shown; findings below may reference images not displayed]

FINDINGS: Gallbladder:

No gallstones or wall thickening visualized. No sonographic Murphy
sign noted by sonographer.

Common bile duct:

Diameter: 3 mm

Liver:

In the posterior aspect of the right hepatic lobe a 1.4 x 1.7 x
cm benign cyst is noted. Within normal limits in parenchymal
echogenicity. Portal vein is patent on color Doppler imaging with
normal direction of blood flow towards the liver.

Other: None.
IMPRESSION: No findings to explain the patient's symptoms.

## 2022-06-09 ENCOUNTER — Ambulatory Visit: Payer: PPO | Admitting: Dermatology

## 2022-07-11 ENCOUNTER — Encounter: Payer: Self-pay | Admitting: Gastroenterology

## 2022-07-11 ENCOUNTER — Ambulatory Visit (INDEPENDENT_AMBULATORY_CARE_PROVIDER_SITE_OTHER): Payer: PPO | Admitting: Gastroenterology

## 2022-07-11 ENCOUNTER — Other Ambulatory Visit: Payer: Self-pay

## 2022-07-11 VITALS — BP 134/83 | HR 66 | Temp 98.4°F | Ht 62.0 in | Wt 141.0 lb

## 2022-07-11 DIAGNOSIS — G8929 Other chronic pain: Secondary | ICD-10-CM | POA: Diagnosis not present

## 2022-07-11 DIAGNOSIS — R1011 Right upper quadrant pain: Secondary | ICD-10-CM

## 2022-07-11 NOTE — Progress Notes (Incomplete)
[name], MD [address]  [place]  [place], [place] [address]  Main: [phone]  Fax: [phone]    Gastroenterology Consultation  Referring Provider:     Lyman Bishop, DO Primary Care Physician:  Lyman Bishop, DO Primary Gastroenterologist:  Dr. [name] Reason for Consultation:     ***        HPI:   Angela Jimenez is a 52 y.o. female referred by Dr. 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   Menometrorrhagia 09/08/2017   Trigger finger 2020    Past Surgical History:  Procedure Laterality Date   COLONOSCOPY WITH PROPOFOL N/A 11/05/2019   Procedure: COLONOSCOPY WITH PROPOFOL;  Surgeon: Lucilla Lame, MD;  Location: [facility] ENDOSCOPY;  Service: Endoscopy;  Laterality: N/A;   ESOPHAGOGASTRODUODENOSCOPY (EGD) WITH PROPOFOL  11/05/2019   Procedure: ESOPHAGOGASTRODUODENOSCOPY (EGD) WITH PROPOFOL;  Surgeon: Lucilla Lame, MD;  Location: [facility] ENDOSCOPY;  Service: Endoscopy;;   MYOMECTOMY ABDOMINAL APPROACH      Prior to Admission medications   Medication Sig Start Date End Date Taking? Authorizing Provider  albuterol (PROVENTIL) (2.5 MG/3ML) 0.083% nebulizer solution Take 3 mLs (2.5 mg total) by nebulization every 6 (six) hours as needed for wheezing or shortness of breath. 11/10/20  Yes Ambs, Kathrine Cords, FNP  albuterol (VENTOLIN HFA) 108 (90 Base) MCG/ACT inhaler INHALE 2 PUFFS INTO THE LUNGS EVERY 4 HOURS AS NEEDED FOR WHEEZING OR SHORTNESS OF BREATH 06/22/20  Yes Ambs, Kathrine Cords, FNP  Aspirin-Acetaminophen-Caffeine (GOODY HEADACHE PO) Take by mouth as needed.   Yes [provider]  cyclobenzaprine (FLEXERIL) 10 MG tablet Take 10 mg by mouth as needed for muscle spasms.   Yes [provider]  diphenhydrAMINE (BENADRYL) 25 MG tablet Take 25 mg by mouth every 4 (four) hours as needed.   Yes [provider]  EPINEPHrine 0.3 mg/0.3 mL IJ SOAJ injection Use as directed for severe allergic reaction. 11/10/20  Yes Ambs, Kathrine Cords, FNP  Fluticasone-Umeclidin-Vilant (TRELEGY ELLIPTA) 200-62.5-25 MCG/INH AEPB INHALE 1 PUFF BY MOUTH DAILY 06/26/20  Yes [provider]  HYDROcodone-acetaminophen (NORCO/VICODIN) 5-325 MG tablet Take 0.5-1 tablets by mouth 2 (two) times daily as needed. 12/28/20   [provider]  hydrocortisone 2.5 % cream Apply topically 2 (two) times daily as needed (Rash). For 3-5 days as needed. 03/31/22   Moye, [place], MD  linaclotide Rolan Lipa) 72 MCG capsule Take 1 capsule (72 mcg total) by mouth daily before breakfast. 07/21/21   Henchy Mccauley, Tally Due, MD  Multiple Vitamin (MULTIVITAMIN ADULT PO) Take 3 tablets by mouth daily. 06/19/19   [provider]  NON FORMULARY Take 6-10 drops by mouth daily at 6 (six) AM. "Harmony" crystal/oxygenated water    [provider]  OVER THE COUNTER MEDICATION Matcha powder mix in food or water and drink once a day    [provider]  OVER THE COUNTER MEDICATION Sovereign Silver immune support take 6 droppers full once a day    [provider]  pantoprazole (PROTONIX) 40 MG tablet Take 40 mg by mouth daily. Patient not taking: Reported on 07/11/2022 09/15/20   [provider]  pseudoephedrine (SUDAFED) 30 MG tablet Take 30 mg by  mouth every 4 (four) hours as needed for congestion.    [provider]  triamcinolone (NASACORT) 55 MCG/ACT AERO nasal inhaler One spray each nostril twice a day as needed 09/04/19   Charlies Silvers, MD  zafirlukast (ACCOLATE) 20 MG tablet Take 20 mg by mouth 2 (two) times daily before a meal. Patient not taking: Reported on 07/11/2022 04/26/20   [provider]    Family History  Problem Relation Age of Onset   Breast  cancer Maternal Aunt    Breast cancer Paternal Aunt    Allergic rhinitis Neg Hx    Angioedema Neg Hx    Asthma Neg Hx    Eczema Neg Hx    Immunodeficiency Neg Hx    Urticaria Neg Hx      Social History   Tobacco Use   Smoking status: Never   Smokeless tobacco: Never  Vaping Use   Vaping Use: Never used  Substance Use Topics   Alcohol use: Not Currently    Comment: occ   Drug use: No    Allergies as of 07/11/2022 - Review Complete 07/11/2022  Allergen Reaction Noted   Amoxicillin Anaphylaxis 05/21/2013   Ciprofloxacin Anaphylaxis 02/05/2018   Molds & smuts Hives and Shortness Of Breath 07/14/2017   Other Anaphylaxis, Hives, and Other (See Comments) 04/09/2015   Peanut-containing drug products Anaphylaxis 05/21/2013   Penicillins Anaphylaxis 05/21/2013   Propoxyphene Nausea And Vomiting 06/19/2019   Sulfa antibiotics Other (See Comments) 07/17/2018   Wheat bran Other (See Comments) 06/27/2016   Wool alcohol [lanolin] Anaphylaxis 08/01/2019   Corn-containing products Other (See Comments) 06/27/2016   Latex Hives 07/14/2017   Prednisone Other (See Comments) 04/13/2017    Review of Systems:    All systems reviewed and negative except where noted in HPI.   Physical Exam:  BP 134/83 (BP Location: Right Arm, Patient Position: Sitting, Cuff Size: Normal)   Pulse 66   Temp 98.4 F (36.9 C) (Oral)   Ht 5' 2"$  (1.575 m)   Wt 141 lb (64 kg)   LMP 05/16/2018   BMI 25.79 kg/m  Patient's last menstrual period was 05/16/2018.  General:   Alert,  Well-developed, well-nourished, pleasant and cooperative in NAD Head:  Normocephalic and atraumatic. Eyes:  Sclera clear, no icterus.   Conjunctiva pink. Ears:  Normal auditory acuity. Nose:  No deformity, discharge, or lesions. Mouth:  No deformity or lesions,oropharynx pink & moist. Neck:  Supple; no masses or thyromegaly. Lungs:  Respirations even and unlabored.  Clear throughout to auscultation.   No wheezes, crackles, or  rhonchi. No acute distress. Heart:  Regular rate and rhythm; no murmurs, clicks, rubs, or gallops. Abdomen:  Normal bowel sounds. Soft, non-tender and non-distended without masses, hepatosplenomegaly or hernias noted.  No guarding or rebound tenderness.   Rectal: Not performed Msk:  Symmetrical without gross deformities. Good, equal movement & strength bilaterally. Pulses:  Normal pulses noted. Extremities:  No clubbing or edema.  No cyanosis. Neurologic:  Alert and oriented x3;  grossly normal neurologically. Skin:  Intact without significant lesions or rashes. No jaundice. Lymph Nodes:  No significant cervical adenopathy. Psych:  Alert and cooperative. Normal mood and affect.  Imaging Studies: ***  Assessment and Plan:   Angela Jimenez is a 52 y.o. female with ***   Follow up in ***   [name], MD

## 2022-07-12 ENCOUNTER — Encounter: Payer: Self-pay | Admitting: Gastroenterology

## 2022-07-12 NOTE — Progress Notes (Signed)
[name], MD [address]  [place]  [place], [place] [address]  Main: [phone]  Fax: [phone] Pager: [phone]   Primary Care Physician: Lyman Bishop, DO  Primary Gastroenterologist:  Dr. Sherri Sear  Chief Complaint  Patient presents with   Abdominal Pain    RUQ pain     HPI: KEEYANA Jimenez is a 52 y.o. female is here for follow-up of right upper quadrant pain.  Patient reports that she has been experiencing right upper quadrant discomfort.  She went to ER on 03/29/2022, underwent right upper quadrant ultrasound which came back normal, no evidence of cholelithiasis.  Her labs including serum lipase, CBC, CMP were normal.  She was also treated for UTI with Macrobid and fosfomycin.  She then went to urgent care on 04/19/2022 for abdominal discomfort and constipation, passing small balls.  She was recommended to take MiraLAX.  She could not take Linzess because it resulted in profuse diarrhea.  She then went to the ER at [facility] on 06/01/2022 secondary to upper abdominal pain in the left upper and right upper quadrants. Labs including pregnancy test, serum lipase, CMP, CBC came back normal.  She underwent CT abdomen and pelvis with IV contrast which was unremarkable.  Throughout the visit today, patient was pointing to her right lower rib cage.  She has been having discomfort.  She denies any radiation to shoulder blades or to right upper quadrant or to the epigastric area.  She denies any nausea or vomiting.  She reports pain is constant and tender to touch.  She denies any rash in that area.  She denies any aggravating or relieving factors.  Denies any particular relation to food.  She describes pain as mild.  Patient does take Goody powder for headaches as needed   Current Outpatient Medications  Medication Sig Dispense Refill   albuterol (PROVENTIL) (2.5 MG/3ML) 0.083% nebulizer solution Take 3 mLs (2.5 mg total) by nebulization every 6 (six) hours  as needed for wheezing or shortness of breath. 75 mL 1   albuterol (VENTOLIN HFA) 108 (90 Base) MCG/ACT inhaler INHALE 2 PUFFS INTO THE LUNGS EVERY 4 HOURS AS NEEDED FOR WHEEZING OR SHORTNESS OF BREATH 8.5 g 0   Aspirin-Acetaminophen-Caffeine (GOODY HEADACHE PO) Take by mouth as needed.     cyclobenzaprine (FLEXERIL) 10 MG tablet Take 10 mg by mouth as needed for muscle spasms.     diphenhydrAMINE (BENADRYL) 25 MG tablet Take 25 mg by mouth every 4 (four) hours as needed.     EPINEPHrine 0.3 mg/0.3 mL IJ SOAJ injection Use as directed for severe allergic reaction. 2 each 3   Fluticasone-Umeclidin-Vilant (TRELEGY ELLIPTA) 200-62.5-25 MCG/INH AEPB INHALE 1 PUFF BY MOUTH DAILY     No current facility-administered medications for this visit.    Allergies as of 07/11/2022 - Review Complete 07/11/2022  Allergen Reaction Noted   Amoxicillin Anaphylaxis 05/21/2013   Ciprofloxacin Anaphylaxis 02/05/2018   Molds & smuts Hives and Shortness Of Breath 07/14/2017   Other Anaphylaxis, Hives, and Other (See Comments) 04/09/2015   Peanut-containing drug products Anaphylaxis 05/21/2013   Penicillins Anaphylaxis 05/21/2013   Propoxyphene Nausea And Vomiting 06/19/2019   Sulfa antibiotics Other (See Comments) 07/17/2018   Wheat bran Other (See Comments) 06/27/2016   Wool alcohol [lanolin] Anaphylaxis 08/01/2019   Corn-containing products Other (See Comments) 06/27/2016   Latex Hives 07/14/2017   Prednisone Other (See Comments) 04/13/2017    NSAIDs: None  Antiplts/Anticoagulants/Anti thrombotics: None  GI procedures:  EGD and colonoscopy 11/05/2019 EGD was normal Colonoscopy revealed nonbleeding internal hemorrhoids only DIAGNOSIS:  A. DUODENUM; COLD BIOPSY:  - ENTERIC MUCOSA WITH PRESERVED VILLOUS ARCHITECTURE AND NO SIGNIFICANT  HISTOPATHOLOGIC CHANGE.  - NEGATIVE FOR FEATURES OF CELIAC, DYSPLASIA, AND MALIGNANCY.   ROS:  General: Negative for anorexia, weight loss, fever, chills, fatigue,  weakness. ENT: Negative for hoarseness, difficulty swallowing , nasal congestion. CV: Negative for chest pain, angina, palpitations, dyspnea on exertion, peripheral edema.  Respiratory: Negative for dyspnea at rest, dyspnea on exertion, cough, sputum, wheezing.  GI: See history of present illness. GU:  Negative for dysuria, hematuria, urinary incontinence, urinary frequency, nocturnal urination.  Endo: Negative for unusual weight change.    Physical Examination:   BP 134/83 (BP Location: Right Arm, Patient Position: Sitting, Cuff Size: Normal)   Pulse 66   Temp 98.4 F (36.9 C) (Oral)   Ht 5' 2"$  (1.575 m)   Wt 141 lb (64 kg)   LMP 05/16/2018   BMI 25.79 kg/m   General: Well-nourished, well-developed in no acute distress.  Eyes: No icterus. Conjunctivae pink. Mouth: Oropharyngeal mucosa moist and pink , no lesions erythema or exudate. Lungs: Clear to auscultation bilaterally. Non-labored. Heart: Regular rate and rhythm, no murmurs rubs or gallops.  Abdomen: Bowel sounds are normal, nontender, nondistended, no hepatosplenomegaly or masses, no hernia , no rebound or guarding.   Rectum: Normal perianal skin, skin tags, nontender digital rectal exam anoscopy revealed enlarged external hemorrhoids Extremities: No lower extremity edema. No clubbing or deformities. Neuro: Alert and oriented x 3.  Grossly intact. Skin: Warm and dry, no jaundice.   Psych: Alert and cooperative, normal mood and affect.   Imaging Studies: No results found.  Assessment and Plan:   Angela Jimenez is a 52 y.o. female with history of chronic pain in the right lower rib cage.  Etiology is most likely musculoskeletal in origin.  Upper endoscopy, right upper quadrant ultrasound as well as CT abdomen and pelvis came back unremarkable.  Advised patient to try Tylenol 100 mg twice daily for 5 days. Avoid frequent usage of Goody powder If her pain is persistent, will proceed with upper endoscopy and evaluate for  biliary dyskinesia with HIDA scan  Follow up as needed   Dr Sherri Sear, MD

## 2022-09-27 ENCOUNTER — Encounter: Payer: Self-pay | Admitting: Family

## 2023-05-28 ENCOUNTER — Encounter: Payer: Self-pay | Admitting: Family

## 2023-05-29 ENCOUNTER — Ambulatory Visit
Admission: RE | Admit: 2023-05-29 | Discharge: 2023-05-29 | Disposition: A | Discharge status: Home / Self Care | Payer: PPO | Source: Ambulatory Visit | Attending: Emergency Medicine | Admitting: Emergency Medicine

## 2023-05-29 VITALS — BP 139/84 | HR 73 | Temp 98.3°F | Resp 18

## 2023-05-29 DIAGNOSIS — J4531 Mild persistent asthma with (acute) exacerbation: Secondary | ICD-10-CM | POA: Diagnosis not present

## 2023-05-29 MED ORDER — PREDNISONE 10 MG PO TABS: 40.0000 mg | ORAL_TABLET | Freq: Every day | ORAL | 0 refills | Status: AC

## 2023-05-29 NOTE — ED Provider Notes (Signed)
 [name]    CSN: [id number] Arrival date & time: 05/29/23  1358      History   Chief Complaint Chief Complaint  Patient presents with   Cough   Nasal Congestion    HPI Angela Jimenez is a 53 y.o. female.  Patient presents with lingering postnasal drip and cough after having had COVID 2 weeks ago.  Her cough is worse at night.  No fever or shortness of breath.  Patient was diagnosed with COVID-19 on [facility]/15/20[facility] at [facility]; treated with Paxlovid, Tessalon  Perles, Promethazine DM. She did not take the Paxlovid due to concern for side effects.  Her medical history includes asthma, allergies, hypertension.  The history is provided by the patient and medical records.    Past Medical History:  Diagnosis Date   Asthma    Bulging lumbar disc    between c3 and c4    Iron deficiency 04/03/2019   Iron malabsorption 09/08/2017   Menometrorrhagia 09/08/2017   Trigger finger 2020    Patient Active Problem List   Diagnosis Date Noted   Abnormal imaging of thyroid  11/04/2020   Benign essential hypertension 11/04/2020   History of depression 11/04/2020   History of nutritional deficiency 11/04/2020   Menopausal state 11/04/2020   Subclinical hyperthyroidism 11/04/2020   Anxiety 10/09/2020   Sciatica 10/09/2020   Severe recurrent major depression with psychotic features (HCC) 10/09/2020   Stress 10/09/2020   Vitamin D  deficiency 10/09/2020   Degenerative disc disease, lumbar 07/29/2020   Referred otalgia of both ears 02/13/2020   Chronic idiopathic constipation    Palpitation 04/03/2019   Food intolerance 01/24/2019   Seasonal allergic conjunctivitis 11/27/2018   Cough 07/06/2018   Acute non-recurrent maxillary sinusitis 06/13/2018   Other allergic rhinitis 06/04/2018   History of uterine leiomyoma 06/04/2018   Croup 05/28/2018   Moderate persistent asthma with acute exacerbation 05/28/2018   Bee sting-induced anaphylaxis 05/28/2018   History of hypothyroidism  02/15/2018   Multiple thyroid  nodules 02/15/2018   ERRONEOUS ENCOUNTER--DISREGARD 02/13/2018   Uterine leiomyoma 01/01/2018   Seasonal and perennial allergic rhinitis 09/18/2017   Menometrorrhagia 09/08/2017   Iron malabsorption 09/08/2017   Chronic bilateral thoracic back pain 07/18/2017   Neck pain 07/18/2017   Symptomatic mammary hypertrophy 07/18/2017   Acute bronchitis due to other specified organisms 06/27/2016   Mild persistent asthma 10/13/2015   Moderate persistent asthma without complication 04/09/2015   Allergic rhinitis due to pollen 04/09/2015   Gastroesophageal reflux disease without esophagitis 04/09/2015   Anaphylactic shock due to adverse food reaction 04/09/2015    Past Surgical History:  Procedure Laterality Date   COLONOSCOPY WITH PROPOFOL  N/A 11/05/2019   Procedure: COLONOSCOPY WITH PROPOFOL ;  Surgeon: Jinny Carmine, MD;  Location: [facility] ENDOSCOPY;  Service: Endoscopy;  Laterality: N/A;   ESOPHAGOGASTRODUODENOSCOPY (EGD) WITH PROPOFOL   11/05/2019   Procedure: ESOPHAGOGASTRODUODENOSCOPY (EGD) WITH PROPOFOL ;  Surgeon: Jinny Carmine, MD;  Location: [facility] ENDOSCOPY;  Service: Endoscopy;;   MYOMECTOMY ABDOMINAL APPROACH      OB History   No obstetric history on file.      Home Medications    Prior to Admission medications   Medication Sig Start Date End Date Taking? Authorizing Provider  predniSONE  (DELTASONE ) 10 MG tablet Take 4 tablets (40 mg total) by mouth daily for 5 days. 05/29/23 06/03/23 Yes Corlis Burnard DEL, NP  albuterol  (PROVENTIL ) (2.5 MG/3ML) 0.083% nebulizer solution Take 3 mLs (2.5 mg total) by nebulization every 6 (six) hours as needed for wheezing or shortness of  breath. 11/10/20   Cari Arlean HERO, FNP  albuterol  (VENTOLIN  HFA) 108 (90 Base) MCG/ACT inhaler INHALE 2 PUFFS INTO THE LUNGS EVERY 4 HOURS AS NEEDED FOR WHEEZING OR SHORTNESS OF BREATH 06/22/20   Ambs, Arlean HERO, FNP  Aspirin-Acetaminophen-Caffeine (GOODY HEADACHE PO) Take by mouth as needed.     [provider]  cyclobenzaprine (FLEXERIL) 10 MG tablet Take 10 mg by mouth as needed for muscle spasms.    [provider]  diphenhydrAMINE  (BENADRYL ) 25 MG tablet Take 25 mg by mouth every 4 (four) hours as needed.    [provider]  EPINEPHrine  0.3 mg/0.3 mL IJ SOAJ injection Use as directed for severe allergic reaction. 11/10/20   Cari Arlean HERO, FNP  Fluticasone -Umeclidin-Vilant (TRELEGY ELLIPTA) 200-62.5-25 MCG/INH AEPB INHALE 1 PUFF BY MOUTH DAILY 06/26/20   [provider]    Family History Family History  Problem Relation Age of Onset   Breast cancer Maternal Aunt    Breast cancer Paternal Aunt    Allergic rhinitis Neg Hx    Angioedema Neg Hx    Asthma Neg Hx    Eczema Neg Hx    Immunodeficiency Neg Hx    Urticaria Neg Hx     Social History Social History   Tobacco Use   Smoking status: Never   Smokeless tobacco: Never  Vaping Use   Vaping status: Never Used  Substance Use Topics   Alcohol  use: Not Currently    Comment: occ   Drug use: No     Allergies   Amoxicillin, Ciprofloxacin, Molds & smuts, Other, Peanut -containing drug products, Penicillins, Propoxyphene, Sulfa antibiotics, Wheat, Wool alcohol  [lanolin], Corn-containing products, Latex, and Prednisone    Review of Systems Review of Systems  Constitutional:  Negative for chills and fever.  HENT:  Positive for postnasal drip. Negative for ear pain and sore throat.   Respiratory:  Positive for cough. Negative for shortness of breath.      Physical Exam Triage Vital Signs ED Triage Vitals  Encounter Vitals Group     BP      Systolic BP Percentile      Diastolic BP Percentile      Pulse      Resp      Temp      Temp src      SpO2      Weight      Height      Head Circumference      Peak Flow      Pain Score      Pain Loc      Pain Education      Exclude from Growth Chart    No data found.  Updated Vital Signs BP 139/84   Pulse 73   Temp 98.3 F (36.8  C)   Resp 18   LMP 05/16/2018   SpO2 97%   Visual Acuity Right Eye Distance:   Left Eye Distance:   Bilateral Distance:    Right Eye Near:   Left Eye Near:    Bilateral Near:     Physical Exam Constitutional:      General: She is not in acute distress. HENT:     Right Ear: Tympanic membrane normal.     Left Ear: Tympanic membrane normal.     Nose: Nose normal.     Mouth/Throat:     Mouth: Mucous membranes are moist.     Pharynx: Oropharynx is clear.     Comments: Clear PND. Cardiovascular:  Rate and Rhythm: Normal rate and regular rhythm.     Heart sounds: Normal heart sounds.  Pulmonary:     Effort: Pulmonary effort is normal. No respiratory distress.     Breath sounds: Normal breath sounds.  Neurological:     Mental Status: She is alert.      UC Treatments / Results  Labs (all labs ordered are listed, but only abnormal results are displayed) Labs Reviewed - No data to display  EKG   Radiology No results found.  Procedures Procedures (including critical care time)  Medications Ordered in UC Medications - No data to display  Initial Impression / Assessment and Plan / UC Course  I have reviewed the triage vital signs and the nursing notes.  Pertinent labs & imaging results that were available during my care of the patient were reviewed by me and considered in my medical decision making (see chart for details).    Asthma exacerbation.  O2 sat 97% on room air.  Patient reports her albuterol  inhaler is in date and has plenty of activations.  Treating today with prednisone  x 5 days.  Instructed patient to follow-up with her PCP.  Education provided on asthma.  She agrees to plan of care.  Final Clinical Impressions(s) / UC Diagnoses   Final diagnoses:  Mild persistent asthma with acute exacerbation     Discharge Instructions      Take the prednisone  as directed.  Continue to use your albuterol  inhaler as directed.  Follow-up with your primary  care provider.     ED Prescriptions     Medication Sig Dispense Auth. Provider   predniSONE  (DELTASONE ) 10 MG tablet Take 4 tablets (40 mg total) by mouth daily for 5 days. 20 tablet Corlis Burnard DEL, NP      PDMP not reviewed this encounter.   Corlis Burnard DEL, NP 05/29/23 [phone]

## 2023-05-29 NOTE — ED Triage Notes (Signed)
 Patient to Urgent Care with complaints of nasal congestion/ nagging cough. Denies any fevers.  Reports symptoms started two weeks ago. Hx of asthma. [facility]en at [facility] and diagnosed with [facility].   Attempted multiple otc meds/ promethazine-DM from [facility]

## 2023-05-29 NOTE — Discharge Instructions (Addendum)
Take the prednisone as directed.  Continue to use your albuterol inhaler as directed.  Follow up with your primary care provider.

## 2023-09-14 ENCOUNTER — Ambulatory Visit
Admission: EM | Admit: 2023-09-14 | Discharge: 2023-09-14 | Disposition: A | Discharge status: Home / Self Care | Attending: Emergency Medicine | Admitting: Emergency Medicine

## 2023-09-14 DIAGNOSIS — H6691 Otitis media, unspecified, right ear: Secondary | ICD-10-CM

## 2023-09-14 MED ORDER — AZITHROMYCIN 250 MG PO TABS: 250.0000 mg | ORAL_TABLET | Freq: Every day | ORAL | 0 refills | Status: DC

## 2023-09-14 MED ORDER — PREDNISONE 10 MG PO TABS: 20.0000 mg | ORAL_TABLET | Freq: Every day | ORAL | 0 refills | Status: AC

## 2023-09-14 NOTE — ED Triage Notes (Signed)
 Provider triage

## 2023-09-14 NOTE — Discharge Instructions (Addendum)
Take the Zithromax and prednisone as directed.  Follow up with your primary care provider if your symptoms are not improving.

## 2023-09-14 NOTE — ED Provider Notes (Signed)
 Angela Jimenez    CSN: [id number] Arrival date & time: 09/14/23  1543      History   Chief Complaint Chief Complaint  Patient presents with   Ear Fullness    HPI Angela Jimenez is a 53 y.o. female.  Patient presents with left ear pain x 1 week and right ear pain today.  She denies fever, ear drainage, sore throat, cough, shortness of breath.  No OTC medication taken today.  Her medical history includes asthma and allergies.  The history is provided by the patient and medical records.    Past Medical History:  Diagnosis Date   Asthma    Bulging lumbar disc    between c3 and c4    Iron deficiency 04/03/2019   Iron malabsorption 09/08/2017   Menometrorrhagia 09/08/2017   Trigger finger 2020    Patient Active Problem List   Diagnosis Date Noted   Abnormal imaging of thyroid  11/04/2020   Benign essential hypertension 11/04/2020   History of depression 11/04/2020   History of nutritional deficiency 11/04/2020   Menopausal state 11/04/2020   Subclinical hyperthyroidism 11/04/2020   Anxiety 10/09/2020   Sciatica 10/09/2020   Severe recurrent major depression with psychotic features (HCC) 10/09/2020   Stress 10/09/2020   Vitamin D  deficiency 10/09/2020   Degenerative disc disease, lumbar 07/29/2020   Referred otalgia of both ears 02/13/2020   Chronic idiopathic constipation    Palpitation 04/03/2019   Food intolerance 01/24/2019   Seasonal allergic conjunctivitis 11/27/2018   Cough 07/06/2018   Acute non-recurrent maxillary sinusitis 06/13/2018   Other allergic rhinitis 06/04/2018   History of uterine leiomyoma 06/04/2018   Croup 05/28/2018   Moderate persistent asthma with acute exacerbation 05/28/2018   Bee sting-induced anaphylaxis 05/28/2018   History of hypothyroidism 02/15/2018   Multiple thyroid  nodules 02/15/2018   ERRONEOUS ENCOUNTER--DISREGARD 02/13/2018   Uterine leiomyoma 01/01/2018   Seasonal and perennial allergic rhinitis 09/18/2017    Menometrorrhagia 09/08/2017   Iron malabsorption 09/08/2017   Chronic bilateral thoracic back pain 07/18/2017   Neck pain 07/18/2017   Symptomatic mammary hypertrophy 07/18/2017   Acute bronchitis due to other specified organisms 06/27/2016   Mild persistent asthma 10/13/2015   Moderate persistent asthma without complication 04/09/2015   Allergic rhinitis due to pollen 04/09/2015   Gastroesophageal reflux disease without esophagitis 04/09/2015   Anaphylactic shock due to adverse food reaction 04/09/2015    Past Surgical History:  Procedure Laterality Date   COLONOSCOPY WITH PROPOFOL  N/A 11/05/2019   Procedure: COLONOSCOPY WITH PROPOFOL ;  Surgeon: Marnee Sink, MD;  Location: [facility] ENDOSCOPY;  Service: Endoscopy;  Laterality: N/A;   ESOPHAGOGASTRODUODENOSCOPY (EGD) WITH PROPOFOL   11/05/2019   Procedure: ESOPHAGOGASTRODUODENOSCOPY (EGD) WITH PROPOFOL ;  Surgeon: Marnee Sink, MD;  Location: [facility] ENDOSCOPY;  Service: Endoscopy;;   MYOMECTOMY ABDOMINAL APPROACH      OB History   No obstetric history on file.      Home Medications    Prior to Admission medications   Medication Sig Start Date End Date Taking? Authorizing Provider  azithromycin  (ZITHROMAX ) 250 MG tablet Take 1 tablet (250 mg total) by mouth daily. Take first 2 tablets together, then 1 every day until finished. 09/14/23  Yes Wellington Half, NP  predniSONE  (DELTASONE ) 10 MG tablet Take 2 tablets (20 mg total) by mouth daily for 3 days. 09/14/23 09/17/23 Yes Wellington Half, NP  albuterol  (PROVENTIL ) (2.5 MG/3ML) 0.083% nebulizer solution Take 3 mLs (2.5 mg total) by nebulization every 6 (six) hours as needed for wheezing  or shortness of breath. 11/10/20   Ambs, Jeanmarie Millet, FNP  albuterol  (VENTOLIN  HFA) 108 (90 Base) MCG/ACT inhaler INHALE 2 PUFFS INTO THE LUNGS EVERY 4 HOURS AS NEEDED FOR WHEEZING OR SHORTNESS OF BREATH 06/22/20   Ambs, Jeanmarie Millet, FNP  Aspirin-Acetaminophen-Caffeine (GOODY HEADACHE PO) Take by mouth as needed.    [provider]  cyclobenzaprine (FLEXERIL) 10 MG tablet Take 10 mg by mouth as needed for muscle spasms.    [provider]  diphenhydrAMINE  (BENADRYL ) 25 MG tablet Take 25 mg by mouth every 4 (four) hours as needed.    [provider]  EPINEPHrine  0.3 mg/0.3 mL IJ SOAJ injection Use as directed for severe allergic reaction. 11/10/20   Ardie Kras, FNP  Fluticasone -Umeclidin-Vilant (TRELEGY ELLIPTA) 200-62.5-25 MCG/INH AEPB INHALE 1 PUFF BY MOUTH DAILY 06/26/20   [provider]    Family History Family History  Problem Relation Age of Onset   Breast cancer Maternal Aunt    Breast cancer Paternal Aunt    Allergic rhinitis Neg Hx    Angioedema Neg Hx    Asthma Neg Hx    Eczema Neg Hx    Immunodeficiency Neg Hx    Urticaria Neg Hx     Social History Social History   Tobacco Use   Smoking status: Never   Smokeless tobacco: Never  Vaping Use   Vaping status: Never Used  Substance Use Topics   Alcohol  use: Not Currently    Comment: occ   Drug use: No     Allergies   Amoxicillin, Ciprofloxacin, Molds & smuts, Other, Peanut -containing drug products, Penicillins, Propoxyphene, Sulfa antibiotics, Wheat, Wool alcohol  [lanolin], Corn-containing products, Latex, and Prednisone    Review of Systems Review of Systems  Constitutional:  Negative for chills and fever.  HENT:  Positive for ear pain. Negative for ear discharge and sore throat.   Respiratory:  Negative for cough and shortness of breath.      Physical Exam Triage Vital Signs ED Triage Vitals [09/14/23 1600]  Encounter Vitals Group     BP 129/83     Systolic BP Percentile      Diastolic BP Percentile      Pulse Rate 70     Resp 18     Temp 97.9 F (36.6 C)     Temp src      SpO2 98 %     Weight      Height      Head Circumference      Peak Flow      Pain Score      Pain Loc      Pain Education      Exclude from Growth Chart    No data found.  Updated Vital Signs BP 129/83    Pulse 70   Temp 97.9 F (36.6 C)   Resp 18   LMP 05/16/2018   SpO2 98%   Visual Acuity Right Eye Distance:   Left Eye Distance:   Bilateral Distance:    Right Eye Near:   Left Eye Near:    Bilateral Near:     Physical Exam Constitutional:      General: She is not in acute distress. HENT:     Right Ear: Ear canal normal. Tympanic membrane is erythematous.     Left Ear: Tympanic membrane and ear canal normal.     Ears:     Comments: Right TM mildly erythematous.    Nose: Nose normal.  Mouth/Throat:     Mouth: Mucous membranes are moist.     Pharynx: Oropharynx is clear.  Cardiovascular:     Rate and Rhythm: Normal rate and regular rhythm.     Heart sounds: Normal heart sounds.  Pulmonary:     Effort: Pulmonary effort is normal. No respiratory distress.     Breath sounds: Normal breath sounds. No wheezing.  Neurological:     Mental Status: She is alert.      UC Treatments / Results  Labs (all labs ordered are listed, but only abnormal results are displayed) Labs Reviewed - No data to display  EKG   Radiology No results found.  Procedures Procedures (including critical care time)  Medications Ordered in UC Medications - No data to display  Initial Impression / Assessment and Plan / UC Course  I have reviewed the triage vital signs and the nursing notes.  Pertinent labs & imaging results that were available during my care of the patient were reviewed by me and considered in my medical decision making (see chart for details).    Right otitis media.  Afebrile and vital signs are stable.  Lungs are clear and O2 sat is 98% on room air.  Patient is allergic to multiple medications.  Treating today with Zithromax  and short course of prednisone .  Patient's chart lists allergy to prednisone  but she states she is able to take low-dose prednisone  without difficulty and has only had problems with high doses.  Education provided on otitis media.  Instructed patient to  follow-up with her PCP if she is not improving.  She agrees to plan of care.  Final Clinical Impressions(s) / UC Diagnoses   Final diagnoses:  Right otitis media, unspecified otitis media type     Discharge Instructions      Take the Zithromax  and prednisone  as directed.  Follow-up with your primary care provider if your symptoms are not improving.      ED Prescriptions     Medication Sig Dispense Auth. Provider   azithromycin  (ZITHROMAX ) 250 MG tablet Take 1 tablet (250 mg total) by mouth daily. Take first 2 tablets together, then 1 every day until finished. 6 tablet Wellington Half, NP   predniSONE  (DELTASONE ) 10 MG tablet Take 2 tablets (20 mg total) by mouth daily for 3 days. 6 tablet Wellington Half, NP      PDMP not reviewed this encounter.   Wellington Half, NP 09/14/23 [phone]

## 2023-09-15 ENCOUNTER — Ambulatory Visit

## 2023-09-21 ENCOUNTER — Encounter: Payer: Self-pay | Admitting: Family

## 2023-09-21 ENCOUNTER — Encounter: Payer: Self-pay | Admitting: Family Medicine

## 2023-09-21 ENCOUNTER — Ambulatory Visit (INDEPENDENT_AMBULATORY_CARE_PROVIDER_SITE_OTHER): Admitting: Family Medicine

## 2023-09-21 VITALS — BP 110/58 | HR 84 | Temp 97.9°F | Ht 62.0 in | Wt 139.0 lb

## 2023-09-21 DIAGNOSIS — H1010 Acute atopic conjunctivitis, unspecified eye: Secondary | ICD-10-CM

## 2023-09-21 DIAGNOSIS — J4541 Moderate persistent asthma with (acute) exacerbation: Secondary | ICD-10-CM

## 2023-09-21 DIAGNOSIS — H1013 Acute atopic conjunctivitis, bilateral: Secondary | ICD-10-CM

## 2023-09-21 DIAGNOSIS — J3089 Other allergic rhinitis: Secondary | ICD-10-CM

## 2023-09-21 DIAGNOSIS — J4540 Moderate persistent asthma, uncomplicated: Secondary | ICD-10-CM | POA: Diagnosis not present

## 2023-09-21 DIAGNOSIS — K219 Gastro-esophageal reflux disease without esophagitis: Secondary | ICD-10-CM

## 2023-09-21 DIAGNOSIS — T7800XA Anaphylactic reaction due to unspecified food, initial encounter: Secondary | ICD-10-CM

## 2023-09-21 DIAGNOSIS — T7800XD Anaphylactic reaction due to unspecified food, subsequent encounter: Secondary | ICD-10-CM

## 2023-09-21 DIAGNOSIS — J302 Other seasonal allergic rhinitis: Secondary | ICD-10-CM

## 2023-09-21 MED ORDER — FLUTICASONE-UMECLIDIN-VILANT 200-62.5-25 MCG/ACT IN AEPB: 1.0000 | INHALATION_SPRAY | Freq: Every day | RESPIRATORY_TRACT | 1 refills | Status: AC

## 2023-09-21 MED ORDER — ALBUTEROL SULFATE HFA 108 (90 BASE) MCG/ACT IN AERS: 2.0000 | INHALATION_SPRAY | RESPIRATORY_TRACT | 1 refills | Status: AC | PRN

## 2023-09-21 MED ORDER — EPINEPHRINE 0.3 MG/0.3ML IJ SOAJ: INTRAMUSCULAR | 3 refills | Status: AC

## 2023-09-21 MED ORDER — PREDNISONE 10 MG PO TABS: ORAL_TABLET | ORAL | 0 refills | Status: DC

## 2023-09-21 NOTE — Patient Instructions (Addendum)
 Asthma Prednisone  10 mg tablets. Take 2 tablets once a day for 4 days, then take 1 tablet on the 5th day, then stop.   Continue Trelegy 200-1 puff once a day to prevent cough or wheeze Continue albuterol  2 puffs every 4 hours as needed for cough or wheeze OR Instead use albuterol  0.083% solution via nebulizer one unit vial every 4 hours as needed for cough or wheeze You may use albuterol  2 puffs 5 to 15 minutes before activity to decrease cough or wheeze  Allergic rhinitis Continue allergen avoidance measures directed toward grass pollen, weed pollen, tree pollen, and mold as listed below Consider saline nasal rinses as needed for nasal symptoms. Use this before any medicated nasal sprays for best result For thick postnasal drainage, begin Mucinex  600 to 1200 mg twice a day and increase fluid intake to thin mucus.  Eustachian tube dysfunction Prednisone  10 mg tablets (as listed above). Take 2 tablets once a day for 4 days, then take 1 tablet on the 5th day, then stop.   Begin Ryaltris 2 sprays in each nostril twice a day for nasal symptoms.  If Ryaltris is not covered by your insurance, restart Flonase  in the right nostril, point the applicator out toward the right ear. In the left nostril, point the applicator out toward the left ear Consider saline nasal rinses as needed for nasal symptoms. Use this before any medicated nasal sprays for best result  Allergic conjunctivitis Some over the counter eye drops include Pataday  one drop in each eye once a day as needed for red, itchy eyes OR Zaditor one drop in each eye twice a day as needed for red itchy eyes. Avoid eye drops that say red eye relief as they may contain medications that dry out your eyes.   Reflux Continue dietary and lifestyle modifications as listed below Continue to follow-up with your GI specialist as recommended  Food allergy Continue to avoid corn, peanut , tree nut, milk, and wheat.  In case of an allergic reaction, take  Benadryl  50 mg every 4 hours, and if life-threatening symptoms occur, inject with EpiPen  0.3 mg.   Call the clinic if this treatment plan is not working well for you.  Follow up in 3 months or sooner if needed.

## 2023-09-21 NOTE — Progress Notes (Signed)
 [address] [place] [address] Dept: [phone]  FOLLOW UP NOTE  Patient ID: Angela Jimenez, female    DOB: [birth year]  Age: 53 y.o. MRN: [id number] Date of Office Visit: 09/21/2023  Assessment  Chief Complaint: Allergic Rhinitis  (Allergies are crazy right now), Asthma (Having chest tightness right now, thinks it may be weather related), and Otalgia (Having ear aches, was diagnosed with an ear infection last week, both ears still hurts. )  HPI Angela Jimenez is a 53 year old female who presents to the clinic for follow-up visit.  She was last seen in this clinic on 11/10/2020 by Marinus Sic, FNP, for evaluation of asthma, allergic rhinitis, allergic conjunctivitis, reflux, and food allergy.  She visited urgent care on 09/14/2023 for evaluation of right otitis media at which time she received azithromycin .  Chart review indicates that she had COVID in late December 2024 after which she went to the emergency department for asthma with exacerbation requiring prednisone .  At today's visit, she reports that she continues to experience some pain in both the ears with right more than left.  She denies hearing change or drainage from either ear.  At this time.  She denies fever, sweats, chills, or sick contacts.  She reports that she did complete the full azithromycin  dose.  Her asthma has been moderately well-controlled with occasional shortness of breath with activity and occasional cough producing mucus which began several weeks ago.  She continues Trelegy 200-1 puff once a day and uses albuterol  occasionally.  She continues to follow-up with her pulmonology specialist as recommended.  Allergic rhinitis is reported as moderately well-controlled with postnasal drainage which began a few weeks ago as the main symptom.  She denies clear rhinorrhea, nasal congestion, or sneezing.  She has recently started using Flonase  and is not currently using azelastine , saline nasal rinses, or  antihistamine.  Allergic conjunctivitis is reported as well-controlled with no medical intervention at this time.  Reflux is reported as well-controlled with no symptoms including heartburn or vomiting.  She is not currently taking a medication to control reflux.  She continues to follow-up with her GI specialist as recommended.  She continues to avoid corn, peanuts, tree nuts, milk, and wheat.  She does report occasional accidental ingestion of especially corn.  She reports her EpiPen  is out of date.  She has not needed an EpiPen  since her last visit to this clinic.  Her current medications are listed in the chart.  Drug Allergies:  Allergies  Allergen Reactions   Amoxicillin Anaphylaxis   Ciprofloxacin Anaphylaxis        Molds & Smuts Hives and Shortness Of Breath   Other Anaphylaxis, Hives and Other (See Comments)    Tree nuts migraine headache   Peanut -Containing Drug Products Anaphylaxis   Penicillins Anaphylaxis   Propoxyphene Nausea And Vomiting   Sulfa Antibiotics Other (See Comments)    ASTHMA ATTACK   Wheat Other (See Comments)    GI Intolerance   Wool Alcohol  [Lanolin] Anaphylaxis    Migraines, asthma, GI upset   Corn-Containing Products Other (See Comments)    Bloated and gassy   Latex Hives   Prednisone  Other (See Comments)    "Makes me numb and tingly" increases eye pressure.     Physical Exam: BP (!) 110/58   Pulse 84   Temp 97.9 F (36.6 C)   Ht 5\' 2"  (1.575 m)   Wt 139 lb (63 kg)   LMP 05/16/2018   SpO2 99%  BMI 25.42 kg/m    Physical Exam Vitals reviewed.  Constitutional:      Appearance: Normal appearance.  HENT:     Head: Normocephalic and atraumatic.     Right Ear: Tympanic membrane normal.     Left Ear: Tympanic membrane normal.     Nose:     Comments: Bilateral nares edematous and pale with thin clear nasal drainage noted.  Pharynx slightly erythematous with no exudate.  Ears normal.  Eyes normal. Eyes:     Conjunctiva/sclera:  Conjunctivae normal.  Cardiovascular:     Rate and Rhythm: Normal rate and regular rhythm.     Heart sounds: Normal heart sounds. No murmur heard. Pulmonary:     Effort: Pulmonary effort is normal.     Breath sounds: Normal breath sounds.     Comments: Lungs clear to auscultation Musculoskeletal:        General: Normal range of motion.     Cervical back: Normal range of motion and neck supple.  Skin:    General: Skin is warm and dry.  Neurological:     Mental Status: She is alert and oriented to person, place, and time.  Psychiatric:        Mood and Affect: Mood normal.        Behavior: Behavior normal.        Thought Content: Thought content normal.        Judgment: Judgment normal.     Diagnostics: FVC 2.41 which is 89% of predicted value, FEV1 1.98 which is 91% of predicted value.  Spirometry indicates normal ventilatory function.  Assessment and Plan: 1. Moderate persistent asthma without complication   2. Seasonal and perennial allergic rhinitis   3. Seasonal allergic conjunctivitis   4. Gastroesophageal reflux disease without esophagitis   5. Allergy with anaphylaxis due to food     Meds ordered this encounter  Medications   albuterol  (VENTOLIN  HFA) 108 (90 Base) MCG/ACT inhaler    Sig: Inhale 2 puffs into the lungs every 4 (four) hours as needed for wheezing or shortness of breath.    Dispense:  3 each    Refill:  1   EPINEPHrine  0.3 mg/0.3 mL IJ SOAJ injection    Sig: Use as directed for severe allergic reaction.    Dispense:  2 each    Refill:  3    Dispense one box of 2 devices each.  Dispense TEVA or MYLAN brand epinephrine .   Fluticasone -Umeclidin-Vilant (TRELEGY ELLIPTA) 200-62.5-25 MCG/ACT AEPB    Sig: Inhale 1 puff into the lungs daily.    Dispense:  1 each    Refill:  1   predniSONE  (DELTASONE ) 10 MG tablet    Sig: Prednisone  10 mg tablets. Take 2 tablets once a day for 4 days, then take 1 tablet on the 5th day, then stop.    Dispense:  9 tablet     Refill:  0    Patient Instructions  Asthma Continue Trelegy 200-1 puff once a day to prevent cough or wheeze Continue albuterol  2 puffs every 4 hours as needed for cough or wheeze OR Instead use albuterol  0.083% solution via nebulizer one unit vial every 4 hours as needed for cough or wheeze You may use albuterol  2 puffs 5 to 15 minutes before activity to decrease cough or wheeze  Allergic rhinitis Continue allergen avoidance measures directed toward grass pollen, weed pollen, tree pollen, and mold as listed below Consider saline nasal rinses as needed for nasal symptoms. Use this before  any medicated nasal sprays for best result For thick postnasal drainage, begin Mucinex  600 to 1200 mg twice a day and increase fluid intake to thin mucus.  Eustachian tube dysfunction Prednisone  10 mg tablets. Take 2 tablets once a day for 4 days, then take 1 tablet on the 5th day, then stop.   Begin Ryaltris 2 sprays in each nostril twice a day for nasal symptoms.  If Ryaltris is not covered by your insurance, restart Flonase  in the right nostril, point the applicator out toward the right ear. In the left nostril, point the applicator out toward the left ear Consider saline nasal rinses as needed for nasal symptoms. Use this before any medicated nasal sprays for best result  Allergic conjunctivitis Some over the counter eye drops include Pataday  one drop in each eye once a day as needed for red, itchy eyes OR Zaditor one drop in each eye twice a day as needed for red itchy eyes. Avoid eye drops that say red eye relief as they may contain medications that dry out your eyes.   Reflux Continue dietary and lifestyle modifications as listed below Continue to follow-up with your GI specialist as recommended  Food allergy Continue to avoid corn, peanut , tree nut, milk, and wheat.  In case of an allergic reaction, take Benadryl  50 mg every 4 hours, and if life-threatening symptoms occur, inject with EpiPen  0.3  mg.   Call the clinic if this treatment plan is not working well for you.  Follow up in 3 months or sooner if needed.  Return in about 3 months (around 12/22/2023), or if symptoms worsen or fail to improve.    Thank you for the opportunity to care for this patient.  Please do not hesitate to contact me with questions.  Marinus Sic, FNP [facility] 

## 2023-09-22 NOTE — Addendum Note (Signed)
 Addended by: Minor Amble on: 09/22/2023 05:15 PM   Modules accepted: Orders

## 2023-11-06 ENCOUNTER — Emergency Department
Admission: EM | Admit: 2023-11-06 | Discharge: 2023-11-06 | Disposition: A | Discharge status: Home / Self Care | Attending: Emergency Medicine | Admitting: Emergency Medicine

## 2023-11-06 ENCOUNTER — Other Ambulatory Visit: Payer: Self-pay

## 2023-11-06 ENCOUNTER — Ambulatory Visit (LOCAL_COMMUNITY_HEALTH_CENTER): Payer: Self-pay

## 2023-11-06 DIAGNOSIS — T63441A Toxic effect of venom of bees, accidental (unintentional), initial encounter: Secondary | ICD-10-CM | POA: Diagnosis present

## 2023-11-06 DIAGNOSIS — T7840XA Allergy, unspecified, initial encounter: Secondary | ICD-10-CM

## 2023-11-06 DIAGNOSIS — J45909 Unspecified asthma, uncomplicated: Secondary | ICD-10-CM | POA: Diagnosis not present

## 2023-11-06 DIAGNOSIS — Z23 Encounter for immunization: Secondary | ICD-10-CM

## 2023-11-06 DIAGNOSIS — Z719 Counseling, unspecified: Secondary | ICD-10-CM

## 2023-11-06 MED ORDER — EPINEPHRINE 0.3 MG/0.3ML IJ SOAJ
0.3000 mg | Freq: Once | INTRAMUSCULAR | Status: DC
  Filled 2023-11-06: qty 0.3

## 2023-11-06 MED ORDER — PREDNISONE 20 MG PO TABS: 20.0000 mg | ORAL_TABLET | Freq: Every day | ORAL | 0 refills | Status: AC

## 2023-11-06 MED ORDER — FAMOTIDINE IN NACL 20-0.9 MG/50ML-% IV SOLN
20.0000 mg | Freq: Once | INTRAVENOUS | Status: AC
  Administered 2023-11-06: 20 mg via INTRAVENOUS
  Filled 2023-11-06: qty 50

## 2023-11-06 MED ORDER — METHYLPREDNISOLONE SODIUM SUCC 125 MG IJ SOLR
125.0000 mg | Freq: Once | INTRAMUSCULAR | Status: AC
  Filled 2023-11-06: qty 2

## 2023-11-06 NOTE — ED Triage Notes (Signed)
 Pt comes with allergic reaction to wasp today. Pt states itching and hives. Pt states reaction to it.

## 2023-11-06 NOTE — ED Provider Notes (Signed)
 [facility] Provider Note    Event Date/Time   First MD Initiated Contact with Patient 11/06/23 1834     (approximate)   History   Chief Complaint Allergic Reaction   HPI  Angela Jimenez is a 53 y.o. female with past medical history of asthma who presents to the ED complaining of allergic reaction.  Patient reports that about an hour prior to arrival she was stung by a wasp on her right thigh.  Shortly afterwards, she noticed itching and hives spreading from the area where she was stung.  She took 50 mg of Benadryl  at home with improvement in the itching, but since arriving to the ED has begun to feel like her throat is swollen with some mild difficulty breathing.  She denies any lightheadedness, nausea, or vomiting.  She reports history of similar allergic reactions requiring EpiPen  administration.     Physical Exam   Triage Vital Signs: ED Triage Vitals  Encounter Vitals Group     BP 11/06/23 1747 (!) 141/77     Girls Systolic BP Percentile --      Girls Diastolic BP Percentile --      Boys Systolic BP Percentile --      Boys Diastolic BP Percentile --      Pulse Rate 11/06/23 1747 69     Resp 11/06/23 1747 18     Temp 11/06/23 1747 98.7 F (37.1 C)     Temp Source 11/06/23 1747 Oral     SpO2 11/06/23 1747 100 %     Weight 11/06/23 1748 140 lb (63.5 kg)     Height 11/06/23 1748 5' 2 (1.575 m)     Head Circumference --      Peak Flow --      Pain Score 11/06/23 1748 5     Pain Loc --      Pain Education --      Exclude from Growth Chart --     Most recent vital signs: Vitals:   11/06/23 1747  BP: (!) 141/77  Pulse: 69  Resp: 18  Temp: 98.7 F (37.1 C)  SpO2: 100%    Constitutional: Alert and oriented. Eyes: Conjunctivae are normal. Head: Atraumatic. Nose: No congestion/rhinnorhea. Mouth/Throat: Mucous membranes are moist.  No significant oropharyngeal edema noted. Cardiovascular: Normal rate, regular rhythm. Grossly normal  heart sounds.  2+ radial pulses bilaterally. Respiratory: Normal respiratory effort.  No retractions. Lungs CTAB. Gastrointestinal: Soft and nontender. No distention. Musculoskeletal: No lower extremity tenderness nor edema.  Neurologic:  Normal speech and language. No gross focal neurologic deficits are appreciated.    ED Results / Procedures / Treatments   Labs (all labs ordered are listed, but only abnormal results are displayed) Labs Reviewed - No data to display   PROCEDURES:  Critical Care performed: No  Procedures   MEDICATIONS ORDERED IN ED: Medications  methylPREDNISolone  sodium succinate (SOLU-MEDROL ) 125 mg/2 mL injection 125 mg (125 mg Intravenous Given 11/06/23 1915)  famotidine (PEPCID) IVPB 20 mg premix (20 mg Intravenous New Bag/Given 11/06/23 1917)     IMPRESSION / MDM / ASSESSMENT AND PLAN / ED COURSE  I reviewed the triage vital signs and the nursing notes.                              53 y.o. female with past medical history of asthma who presents to the ED complaining of wasp sting earlier this afternoon  with subsequent rash, now with some sensation of throat swelling and difficulty breathing.  Patient's presentation is most consistent with acute, uncomplicated illness.  Differential diagnosis includes, but is not limited to, anaphylaxis, allergic reaction, hives.  Patient nontoxic-appearing and in no acute distress, vital signs are unremarkable.  Patient initially denied difficulty breathing on arrival to the ED, but shortly afterwards reported feeling like her throat was swelling.  Decision was made to proceed with epinephrine  administration, but in the time that epinephrine  was prepared, patient stated that difficulty breathing and throat swelling had resolved.  She declines epinephrine , was given IV Solu-Medrol  and IV Pepcid in addition to the Benadryl  she took prior to arrival.  Symptoms have resolved on reassessment and she will be observed here in the  ED for signs of recurrence.  No recurrent symptoms on reassessment, patient has been observed for greater than 3 hours here in the ED and is appropriate for discharge home with outpatient follow-up.  She was counseled to return to the ED for new or worsening symptoms, patient agrees with plan.      FINAL CLINICAL IMPRESSION(S) / ED DIAGNOSES   Final diagnoses:  Allergic reaction, initial encounter  Bee sting, accidental or unintentional, initial encounter     Rx / DC Orders   ED Discharge Orders     None        Note:  This document was prepared using Dragon voice recognition software and may include unintentional dictation errors.   Twilla Galea, MD 11/06/23 2046

## 2023-11-06 NOTE — ED Notes (Signed)
 See triage note   Presents s/p wasp sting   States she batted at it and she was stung on the thigh  Did have some itching with slight hives  Has taken 50 mg benadryl  at 4 pm  No resp distress noted

## 2023-11-06 NOTE — Progress Notes (Signed)
 Pt in nurse clinic requesting Tdap vaccine needed for a job. Pt states I had last tdap vaccine year 2010. Reviewed [facility], given VIS, eligible, agreed and administered, tolerated well. Given [facility] copies, explained and understood. M.Magnum Lunde, LPN.

## 2023-11-06 NOTE — ED Notes (Signed)
 Pt states her symptoms have improved since she arrived and took the benadryl . Her throat is no longer scratchy and the itching has resolved. Discussed epi pen with Dr Cleora Daft who recommended holding Epi unless symptoms return. Pt on 2 hour observation.

## 2023-11-29 ENCOUNTER — Ambulatory Visit (LOCAL_COMMUNITY_HEALTH_CENTER): Payer: Self-pay

## 2023-11-29 DIAGNOSIS — Z111 Encounter for screening for respiratory tuberculosis: Secondary | ICD-10-CM

## 2023-11-29 NOTE — Progress Notes (Signed)
 Here for PPD. [facility] copy given per her request. Dnaiel Voller, RN

## 2023-12-01 ENCOUNTER — Ambulatory Visit (LOCAL_COMMUNITY_HEALTH_CENTER): Payer: Self-pay

## 2023-12-01 DIAGNOSIS — Z111 Encounter for screening for respiratory tuberculosis: Secondary | ICD-10-CM

## 2023-12-01 LAB — TB SKIN TEST
Induration: 0 mm
TB Skin Test: NEGATIVE

## 2024-04-09 ENCOUNTER — Other Ambulatory Visit: Payer: Self-pay | Admitting: Adult Medicine

## 2024-04-09 DIAGNOSIS — Z1231 Encounter for screening mammogram for malignant neoplasm of breast: Secondary | ICD-10-CM

## 2024-04-24 ENCOUNTER — Inpatient Hospital Stay: Admission: RE | Admit: 2024-04-24 | Discharge: 2024-04-24 | Attending: Emergency Medicine

## 2024-04-24 VITALS — BP 134/86 | HR 70 | Temp 98.3°F | Resp 18

## 2024-04-24 DIAGNOSIS — J0100 Acute maxillary sinusitis, unspecified: Secondary | ICD-10-CM | POA: Diagnosis not present

## 2024-04-24 DIAGNOSIS — J4541 Moderate persistent asthma with (acute) exacerbation: Secondary | ICD-10-CM

## 2024-04-24 MED ORDER — AZITHROMYCIN 250 MG PO TABS: 250.0000 mg | ORAL_TABLET | Freq: Every day | ORAL | 0 refills | Status: AC

## 2024-04-24 MED ORDER — PREDNISONE 10 MG PO TABS: 40.0000 mg | ORAL_TABLET | Freq: Every day | ORAL | 0 refills | Status: AC

## 2024-04-24 NOTE — ED Provider Notes (Signed)
 Angela Jimenez    CSN: [id number] Arrival date & time: 04/24/24  1033      History   Chief Complaint Chief Complaint  Patient presents with   Wheezing   Facial Pain    HPI Angela Jimenez is a 53 y.o. female.  Patient presents with 5-day history of wheezing, shortness of breath, sinus pressure, congestion, sinus pain, sinus headache.  The wheezing and shortness of breath are worse at night.  She last used her albuterol  inhaler yesterday evening.  She states her symptoms started after she attempted to reintroduce gluten into her diet by eating pizza 5 days ago.  She denies fever, chills, chest pain, vomiting, diarrhea.  Her medical history includes moderate persistent asthma, allergies, hypertension.  The history is provided by the patient and medical records.    Past Medical History:  Diagnosis Date   Asthma    Bulging lumbar disc    between c3 and c4    Iron deficiency 04/03/2019   Iron malabsorption 09/08/2017   Menometrorrhagia 09/08/2017   Trigger finger 2020    Patient Active Problem List   Diagnosis Date Noted   Abnormal imaging of thyroid  11/04/2020   Benign essential hypertension 11/04/2020   History of depression 11/04/2020   History of nutritional deficiency 11/04/2020   Menopausal state 11/04/2020   Subclinical hyperthyroidism 11/04/2020   Anxiety 10/09/2020   Sciatica 10/09/2020   Severe recurrent major depression with psychotic features (HCC) 10/09/2020   Stress 10/09/2020   Vitamin D  deficiency 10/09/2020   Degenerative disc disease, lumbar 07/29/2020   Referred otalgia of both ears 02/13/2020   Chronic idiopathic constipation    Palpitation 04/03/2019   Food intolerance 01/24/2019   Seasonal allergic conjunctivitis 11/27/2018   Cough 07/06/2018   Acute non-recurrent maxillary sinusitis 06/13/2018   Other allergic rhinitis 06/04/2018   History of uterine leiomyoma 06/04/2018   Croup 05/28/2018   Moderate persistent asthma with acute  exacerbation 05/28/2018   Bee sting-induced anaphylaxis 05/28/2018   History of hypothyroidism 02/15/2018   Multiple thyroid  nodules 02/15/2018   ERRONEOUS ENCOUNTER--DISREGARD 02/13/2018   Uterine leiomyoma 01/01/2018   Seasonal and perennial allergic rhinitis 09/18/2017   Menometrorrhagia 09/08/2017   Iron malabsorption 09/08/2017   Chronic bilateral thoracic back pain 07/18/2017   Neck pain 07/18/2017   Symptomatic mammary hypertrophy 07/18/2017   Acute bronchitis due to other specified organisms 06/27/2016   Mild persistent asthma 10/13/2015   Moderate persistent asthma without complication 04/09/2015   Allergic rhinitis due to pollen 04/09/2015   Gastroesophageal reflux disease without esophagitis 04/09/2015   Anaphylactic shock due to adverse food reaction 04/09/2015    Past Surgical History:  Procedure Laterality Date   COLONOSCOPY WITH PROPOFOL  N/A 11/05/2019   Procedure: COLONOSCOPY WITH PROPOFOL ;  Surgeon: Jinny Carmine, MD;  Location: [facility] ENDOSCOPY;  Service: Endoscopy;  Laterality: N/A;   ESOPHAGOGASTRODUODENOSCOPY (EGD) WITH PROPOFOL   11/05/2019   Procedure: ESOPHAGOGASTRODUODENOSCOPY (EGD) WITH PROPOFOL ;  Surgeon: Jinny Carmine, MD;  Location: [facility] ENDOSCOPY;  Service: Endoscopy;;   MYOMECTOMY ABDOMINAL APPROACH      OB History   No obstetric history on file.      Home Medications    Prior to Admission medications   Medication Sig Start Date End Date Taking? Authorizing Provider  azithromycin  (ZITHROMAX ) 250 MG tablet Take 1 tablet (250 mg total) by mouth daily. Take first 2 tablets together, then 1 every day until finished. 04/24/24  Yes Corlis Burnard DEL, NP  predniSONE  (DELTASONE ) 10 MG tablet Take 4 tablets (  40 mg total) by mouth daily for 5 days. 04/24/24 04/29/24 Yes Corlis Burnard DEL, NP  albuterol  (PROVENTIL ) (2.5 MG/3ML) 0.083% nebulizer solution Take 3 mLs (2.5 mg total) by nebulization every 6 (six) hours as needed for wheezing or shortness of breath. 11/10/20    Ambs, Arlean HERO, FNP  albuterol  (VENTOLIN  HFA) 108 (90 Base) MCG/ACT inhaler Inhale 2 puffs into the lungs every 4 (four) hours as needed for wheezing or shortness of breath. 09/21/23   Cari Arlean HERO, FNP  Aspirin-Acetaminophen-Caffeine (GOODY HEADACHE PO) Take by mouth as needed.    [provider]  cyclobenzaprine (FLEXERIL) 10 MG tablet Take 10 mg by mouth as needed for muscle spasms. Patient not taking: Reported on 09/21/2023    [provider]  diphenhydrAMINE  (BENADRYL ) 25 MG tablet Take 25 mg by mouth every 4 (four) hours as needed.    [provider]  EPINEPHrine  0.3 mg/0.3 mL IJ SOAJ injection Use as directed for severe allergic reaction. 09/21/23   Cari Arlean HERO, FNP  Fluticasone -Umeclidin-Vilant (TRELEGY ELLIPTA) 200-62.5-25 MCG/ACT AEPB Inhale 1 puff into the lungs daily. 09/21/23   Cari Arlean HERO, FNP    Family History Family History  Problem Relation Age of Onset   Breast cancer Maternal Aunt    Breast cancer Paternal Aunt    Allergic rhinitis Neg Hx    Angioedema Neg Hx    Asthma Neg Hx    Eczema Neg Hx    Immunodeficiency Neg Hx    Urticaria Neg Hx     Social History Social History   Tobacco Use   Smoking status: Never   Smokeless tobacco: Never  Vaping Use   Vaping status: Never Used  Substance Use Topics   Alcohol  use: Not Currently    Comment: occ   Drug use: No     Allergies   Amoxicillin, Ciprofloxacin, Molds & smuts, Other, Peanut -containing drug products, Penicillins, Propoxyphene, Sulfa antibiotics, Wheat, Wool alcohol  [lanolin], Corn-containing products, Latex, and Prednisone    Review of Systems Review of Systems  Constitutional:  Negative for chills and fever.  HENT:  Positive for congestion, postnasal drip, sinus pressure and sinus pain. Negative for ear pain and sore throat.   Respiratory:  Positive for cough, shortness of breath and wheezing.   Cardiovascular:  Negative for chest pain and palpitations.  Gastrointestinal:   Negative for diarrhea and vomiting.     Physical Exam Triage Vital Signs ED Triage Vitals  Encounter Vitals Group     BP 04/24/24 1050 134/86     Girls Systolic BP Percentile --      Girls Diastolic BP Percentile --      Boys Systolic BP Percentile --      Boys Diastolic BP Percentile --      Pulse Rate 04/24/24 1050 70     Resp 04/24/24 1050 18     Temp 04/24/24 1050 98.3 F (36.8 C)     Temp src --      SpO2 04/24/24 1050 98 %     Weight --      Height --      Head Circumference --      Peak Flow --      Pain Score 04/24/24 1055 7     Pain Loc --      Pain Education --      Exclude from Growth Chart --    No data found.  Updated Vital Signs BP 134/86   Pulse 70   Temp 98.3 F (  36.8 C)   Resp 18   LMP 05/16/2018   SpO2 98%   Visual Acuity Right Eye Distance:   Left Eye Distance:   Bilateral Distance:    Right Eye Near:   Left Eye Near:    Bilateral Near:     Physical Exam Constitutional:      General: She is not in acute distress. HENT:     Right Ear: Tympanic membrane normal.     Left Ear: Tympanic membrane normal.     Nose: Congestion present.     Mouth/Throat:     Mouth: Mucous membranes are moist.     Pharynx: Oropharynx is clear.  Cardiovascular:     Rate and Rhythm: Normal rate and regular rhythm.     Heart sounds: Normal heart sounds.  Pulmonary:     Effort: Pulmonary effort is normal. No respiratory distress.     Breath sounds: Normal breath sounds. No wheezing.  Neurological:     Mental Status: She is alert.      UC Treatments / Results  Labs (all labs ordered are listed, but only abnormal results are displayed) Labs Reviewed - No data to display  EKG   Radiology No results found.  Procedures Procedures (including critical care time)  Medications Ordered in UC Medications - No data to display  Initial Impression / Assessment and Plan / UC Course  I have reviewed the triage vital signs and the nursing notes.  Pertinent  labs & imaging results that were available during my care of the patient were reviewed by me and considered in my medical decision making (see chart for details).    Asthma exacerbation, acute sinusitis.  Afebrile and vital signs are stable.  Lungs are clear and O2 sat is 98% on room air.  Treating today with Zithromax , prednisone , continued use of albuterol  inhaler.  ED precautions discussed.  Instructed patient to follow-up with her PCP tomorrow.  Education provided on asthma and sinus infection.  Patient agrees to plan of care.  Final Clinical Impressions(s) / UC Diagnoses   Final diagnoses:  Moderate persistent asthma with acute exacerbation  Acute non-recurrent maxillary sinusitis     Discharge Instructions      Follow up with your primary care provider tomorrow.  Go to the emergency department if you have worsening symptoms.    Take the Zithromax  and prednisone  as directed.  Use your albuterol  inhaler as directed.       ED Prescriptions     Medication Sig Dispense Auth. Provider   azithromycin  (ZITHROMAX ) 250 MG tablet Take 1 tablet (250 mg total) by mouth daily. Take first 2 tablets together, then 1 every day until finished. 6 tablet Corlis Burnard DEL, NP   predniSONE  (DELTASONE ) 10 MG tablet Take 4 tablets (40 mg total) by mouth daily for 5 days. 20 tablet Corlis Burnard DEL, NP      PDMP not reviewed this encounter.   Corlis Burnard DEL, NP 04/24/24 [phone]

## 2024-04-24 NOTE — Discharge Instructions (Addendum)
 Follow up with your primary care provider tomorrow.  Go to the emergency department if you have worsening symptoms.    Take the Zithromax  and prednisone  as directed.  Use your albuterol  inhaler as directed.

## 2024-04-24 NOTE — ED Triage Notes (Signed)
 Patient to Urgent Care with complaints of sinus pressure/ wheezing.  Reports two weeks ago she had an anaphylactic reaction to tree nuts. On Friday she ate gluten (pizza). Started experiencing sinus pain/ wheezing/ headaches/ left sided jaw sensitivity  Taking benadryl / trilegy and albuterol  inhaler/ sudafed/ goody powers.Angela Jimenez
